# Patient Record
Sex: Male | Born: 1955
Health system: Southern US, Community
[De-identification: ages and names within clinical notes are randomized; demographics above are authoritative.]

## PROBLEM LIST (undated history)

## (undated) DIAGNOSIS — Z87442 Personal history of urinary calculi: Secondary | ICD-10-CM

## (undated) DIAGNOSIS — M2550 Pain in unspecified joint: Secondary | ICD-10-CM

## (undated) DIAGNOSIS — M1990 Unspecified osteoarthritis, unspecified site: Secondary | ICD-10-CM

## (undated) DIAGNOSIS — J189 Pneumonia, unspecified organism: Secondary | ICD-10-CM

## (undated) DIAGNOSIS — I10 Essential (primary) hypertension: Secondary | ICD-10-CM

## (undated) DIAGNOSIS — E669 Obesity, unspecified: Secondary | ICD-10-CM

## (undated) DIAGNOSIS — G4730 Sleep apnea, unspecified: Secondary | ICD-10-CM

## (undated) DIAGNOSIS — G4733 Obstructive sleep apnea (adult) (pediatric): Secondary | ICD-10-CM

## (undated) DIAGNOSIS — I499 Cardiac arrhythmia, unspecified: Secondary | ICD-10-CM

## (undated) DIAGNOSIS — M549 Dorsalgia, unspecified: Secondary | ICD-10-CM

## (undated) DIAGNOSIS — R0602 Shortness of breath: Secondary | ICD-10-CM

## (undated) HISTORY — PX: HIP SURGERY: SHX245

## (undated) HISTORY — DX: Obstructive sleep apnea (adult) (pediatric): G47.33

## (undated) HISTORY — PX: ACHILLES TENDON REPAIR: SUR1153

## (undated) HISTORY — DX: Shortness of breath: R06.02

## (undated) HISTORY — DX: Unspecified osteoarthritis, unspecified site: M19.90

## (undated) HISTORY — PX: KIDNEY STONE SURGERY: SHX686

## (undated) HISTORY — PX: COLONOSCOPY: SHX174

## (undated) HISTORY — PX: SHOULDER SURGERY: SHX246

## (undated) HISTORY — DX: Dorsalgia, unspecified: M54.9

## (undated) HISTORY — DX: Obesity, unspecified: E66.9

## (undated) HISTORY — PX: LITHOTRIPSY: SUR834

## (undated) HISTORY — DX: Pain in unspecified joint: M25.50

---

## 1979-01-01 HISTORY — PX: NASAL SEPTUM SURGERY: SHX37

## 1988-12-31 HISTORY — PX: SHOULDER SURGERY: SHX246

## 1993-05-02 HISTORY — PX: JOINT REPLACEMENT: SHX530

## 1998-02-10 ENCOUNTER — Ambulatory Visit (HOSPITAL_COMMUNITY): Admission: RE | Admit: 1998-02-10 | Discharge: 1998-02-10 | Payer: Self-pay | Admitting: Family Medicine

## 1998-02-10 ENCOUNTER — Encounter: Payer: Self-pay | Admitting: Family Medicine

## 1998-03-10 ENCOUNTER — Emergency Department (HOSPITAL_COMMUNITY): Admission: EM | Admit: 1998-03-10 | Discharge: 1998-03-10 | Payer: Self-pay | Admitting: Emergency Medicine

## 1998-03-20 ENCOUNTER — Encounter: Payer: Self-pay | Admitting: *Deleted

## 1998-03-20 ENCOUNTER — Ambulatory Visit (HOSPITAL_COMMUNITY): Admission: RE | Admit: 1998-03-20 | Discharge: 1998-03-20 | Payer: Self-pay | Admitting: *Deleted

## 1998-05-02 DIAGNOSIS — I499 Cardiac arrhythmia, unspecified: Secondary | ICD-10-CM

## 1998-05-02 HISTORY — DX: Cardiac arrhythmia, unspecified: I49.9

## 1998-06-11 ENCOUNTER — Observation Stay (HOSPITAL_COMMUNITY): Admission: EM | Admit: 1998-06-11 | Discharge: 1998-06-12 | Payer: Self-pay | Admitting: Emergency Medicine

## 1998-06-11 ENCOUNTER — Encounter: Payer: Self-pay | Admitting: Urology

## 1998-06-12 HISTORY — PX: DOPPLER ECHOCARDIOGRAPHY: SHX263

## 1998-06-18 ENCOUNTER — Ambulatory Visit (HOSPITAL_COMMUNITY): Admission: RE | Admit: 1998-06-18 | Discharge: 1998-06-18 | Payer: Self-pay | Admitting: Urology

## 1998-06-18 ENCOUNTER — Encounter: Payer: Self-pay | Admitting: Urology

## 1999-11-18 ENCOUNTER — Ambulatory Visit (HOSPITAL_COMMUNITY): Admission: RE | Admit: 1999-11-18 | Discharge: 1999-11-18 | Payer: Self-pay | Admitting: Urology

## 1999-11-18 ENCOUNTER — Encounter: Payer: Self-pay | Admitting: Urology

## 2001-11-15 ENCOUNTER — Encounter: Payer: Self-pay | Admitting: Urology

## 2001-11-15 ENCOUNTER — Ambulatory Visit (HOSPITAL_BASED_OUTPATIENT_CLINIC_OR_DEPARTMENT_OTHER): Admission: RE | Admit: 2001-11-15 | Discharge: 2001-11-15 | Payer: Self-pay | Admitting: Urology

## 2003-06-23 ENCOUNTER — Inpatient Hospital Stay (HOSPITAL_COMMUNITY): Admission: RE | Admit: 2003-06-23 | Discharge: 2003-06-26 | Payer: Self-pay | Admitting: Orthopedic Surgery

## 2004-08-09 ENCOUNTER — Encounter: Admission: RE | Admit: 2004-08-09 | Discharge: 2004-08-09 | Payer: Self-pay | Admitting: Orthopedic Surgery

## 2004-08-17 ENCOUNTER — Encounter: Admission: RE | Admit: 2004-08-17 | Discharge: 2004-08-17 | Payer: Self-pay | Admitting: Orthopedic Surgery

## 2004-09-22 HISTORY — PX: OTHER SURGICAL HISTORY: SHX169

## 2004-12-03 ENCOUNTER — Encounter: Admission: RE | Admit: 2004-12-03 | Discharge: 2004-12-03 | Payer: Self-pay | Admitting: Orthopedic Surgery

## 2005-03-02 ENCOUNTER — Ambulatory Visit (HOSPITAL_COMMUNITY): Admission: RE | Admit: 2005-03-02 | Discharge: 2005-03-02 | Payer: Self-pay | Admitting: Orthopedic Surgery

## 2007-04-18 ENCOUNTER — Encounter: Admission: RE | Admit: 2007-04-18 | Discharge: 2007-04-18 | Payer: Self-pay | Admitting: Orthopedic Surgery

## 2007-07-19 ENCOUNTER — Ambulatory Visit (HOSPITAL_BASED_OUTPATIENT_CLINIC_OR_DEPARTMENT_OTHER): Admission: RE | Admit: 2007-07-19 | Discharge: 2007-07-19 | Payer: Self-pay | Admitting: Orthopedic Surgery

## 2008-07-17 ENCOUNTER — Ambulatory Visit (HOSPITAL_COMMUNITY): Admission: RE | Admit: 2008-07-17 | Discharge: 2008-07-18 | Payer: Self-pay | Admitting: Orthopedic Surgery

## 2009-12-24 ENCOUNTER — Ambulatory Visit (HOSPITAL_COMMUNITY): Admission: RE | Admit: 2009-12-24 | Discharge: 2009-12-24 | Payer: Self-pay | Admitting: Urology

## 2010-07-15 LAB — SURGICAL PCR SCREEN

## 2010-08-12 LAB — URINALYSIS, ROUTINE W REFLEX MICROSCOPIC
Ketones, ur: NEGATIVE mg/dL
Leukocytes, UA: NEGATIVE
Nitrite: NEGATIVE
Protein, ur: NEGATIVE mg/dL

## 2010-08-12 LAB — PROTIME-INR: INR: 1 (ref 0.00–1.49)

## 2010-08-12 LAB — CBC
HCT: 42.4 % (ref 39.0–52.0)
MCHC: 34.6 g/dL (ref 30.0–36.0)
MCV: 87 fL (ref 78.0–100.0)
Platelets: 238 10*3/uL (ref 150–400)
RDW: 12.9 % (ref 11.5–15.5)

## 2010-08-12 LAB — COMPREHENSIVE METABOLIC PANEL
Albumin: 4 g/dL (ref 3.5–5.2)
BUN: 16 mg/dL (ref 6–23)
Calcium: 10.1 mg/dL (ref 8.4–10.5)
Creatinine, Ser: 1.13 mg/dL (ref 0.4–1.5)
Total Bilirubin: 0.7 mg/dL (ref 0.3–1.2)
Total Protein: 6.6 g/dL (ref 6.0–8.3)

## 2010-08-12 LAB — APTT: aPTT: 33 seconds (ref 24–37)

## 2010-09-14 NOTE — Op Note (Signed)
NAME:  Luis, Mccann                  ACCOUNT NO.:  [id number]   MEDICAL RECORD NO.:  [id number]          PATIENT TYPE:  OIB   LOCATION:  0098                         FACILITY:  [facility]   PHYSICIAN:  Ollen Gross, M.D.    DATE OF BIRTH:  1955-05-23   DATE OF PROCEDURE:  07/17/2008  DATE OF DISCHARGE:                               OPERATIVE REPORT   PREOPERATIVE DIAGNOSIS:  Right hip flexor tendon synovitis/impingement  syndrome.   POSTOPERATIVE DIAGNOSIS:  Right hip flexor tendon synovitis/impingement  syndrome.   PROCEDURE:  Right hip flexor tendon release.   SURGEON:  Homero Fellers Aluisio. MD.   ASSISTANTAvel Peace PA-C.   ANESTHESIA:  General.   ESTIMATED BLOOD LOSS:  Minimal.   DRAIN:  None.   COMPLICATIONS:  None.   CONDITION:  Stable to recovery.   BRIEF CLINICAL NOTE:  Luis Mccann is a 55 year old male status post right  total hip arthroplasty several years ago.  He has had a persistent pain  and popping anteriorly in his hip.  He has had an extensive workup of  this and there has been no definitive cause.  He has seen multiple  orthopedic surgeons in second opinion, all joint replacement  specialists, who have felt that at most it was a flexor tendon impinging  on a joint.  We have had multiple long discussions over the ensuing  years over what to do with this.  He has come into a stage where he  finally wants to have this addressed surgically.  We discussed the pros  and cons of flexor tendon release including the possibility that it  might not relieve his problem.  He has is at a stage now where he wants  to go ahead and try this to see if it could eliminate the pain and  popping.   PROCEDURE IN DETAIL:  After the successful administration of general  anesthetic, the patient is placed in the left lateral decubitus position  with the right side up and held with the hip positioner.  The right  lower extremity is isolated from his perineum with plastic drapes and  prepped  and draped in the usual sterile fashion.  I used his previous  posterolateral incision and cut the skin with a 10-blade through the  subcutaneous tissue to the fascia lata which was incised in line with  the skin incision.  The sciatic nerve was palpated and protected.  I  excised the pseudocapsule off the posterior femur.  We entered the  joint.  We did not encounter any significant joint fluid.  There was no  evidence of any osteolytic debris in the joint.  I thoroughly irrigated  the joint.  He did have what appeared to be hypertrophic capsule  anteriorly.  I excised all of that to fully clear out the anterior  capsule from the joint.  We then internally rotated the hip and I  palpated the iliopsoas tendon as it inserted onto the lesser trochanter.  I recessed the tendon off of the lesser trochanter with electrocautery.  I then thoroughly inspected the joint again,  there were no other areas  which potentially would have been impinging as I placed them through a  range of motion.  We thoroughly irrigated the joint once again and then  closed the fascia lata with interrupted #1 Vicryl, subcu interrupted #1-  0 and #2-0 Vicryl, and subcuticular running 4-0 Monocryl.  The incision  was cleaned and dried and Steri-Strips and a bulky sterile dressing are  applied.  He is then awakened and transported to recovery in stable  condition.      Ollen Gross, M.D.  Electronically Signed     FA/MEDQ  D:  07/17/2008  T:  07/17/2008  Job:  [id number]

## 2010-09-14 NOTE — Op Note (Signed)
NAME:  Luis Mccann, Luis Mccann                  ACCOUNT NO.:  [id number]   MEDICAL RECORD NO.:  [id number]          PATIENT TYPE:  AMB   LOCATION:  NESC                         FACILITY:  [facility]   PHYSICIAN:  Deidre Ala, M.D.    DATE OF BIRTH:  21-Feb-1956   DATE OF PROCEDURE:  07/19/2007  DATE OF DISCHARGE:                               OPERATIVE REPORT   PREOPERATIVE DIAGNOSIS:  1. Impingement right shoulder with type 3 acromion.  2. Partial-thickness rotator cuff tear.  3. Osteoarthritis acromioclavicular joint.   POSTOPERATIVE DIAGNOSIS:  1. Impingement right shoulder with type 3 acromion.  2. Partial-thickness rotator cuff tear.  3. Osteoarthritis acromioclavicular joint.   PROCEDURE:  1. Right shoulder operative arthroscopy with subacromial arch      decompression and acromioplasty.  2. Arthroscopic distal clavicle resection.  3. Debride partial-thickness rotator cuff tear and subdeltoid bursa.   SURGEON:  Dr. Doristine Section.   ASSISTANT:  Phineas Semen, PA-C.   ANESTHESIA:  General endotracheal.   CULTURES:  None.   DRAINS:  None.   ESTIMATED BLOOD LOSS:  Minimal.   PATHOLOGIC FINDINGS AND HISTORY:  Manasseh is a 55 year old male who  presented with persistent shoulder pain.  He had signs of impingement.  He had a type 4 acromion on 35 degree caudad acromial view, AC joint  squaring changes. The shoulder was at one continuing to have discomfort  and we got an MRI scan which showed rotator cuff disease with partial  articular surface tear of the distal supraspinatus tendon, no full-  thickness tear. He had partially extravasated fluid from the  subscapularis recess into the substance of the subscapularis muscle  small superior labral tear.  He had had a shoulder arthroscopy on the  opposite side in the past and was doing well with that.  Ultimately he  was not making progress with his shoulder, so I elected to proceed with  surgical intervention.  At surgery, he had an intact  biceps anchor. The  biceps tendon had no evidence of significant tendonitis although it was  a little frayed proximally that we debrided and smoothed with the  ablator.  There was no slap pull off.  The glenohumeral joint looked  good.  There was some undersurface irregularity of the rotator cuff at  the tuberosity that we debrided and ablated, a mild anterior shoulder  triangle synovitis.  In the subacromial space, he had a very sharp  anterior acromion with a hook type 4 and a very arthritic distal  clavicle with marked degeneration of the [facility] meniscus that looked like it  had been very much traumatized.  We resected the anterior acromion and  distal clavicle to Caspari margins.  The rotator cuff had a central worn  area that was about a third to 50% through delaminated but was deeper  thereof and intact to the tuberosity otherwise, so this was shaved and  ablated.  There was no through-and-through tear.  Good decompression was  obtained.   PROCEDURE:  With adequate anesthesia obtained using endotracheal  technique with a failure of the attempt to  place a block, the patient  was placed in the supine beach chair position, the right shoulder was  then prepped and draped in the standard fashion.  After standard  prepping and draping, skin markings were made for anatomic positioning.  I then injected 20 mL 0.5% Marcaine with epinephrine into the  subacromial space to open it up.  I then entered the shoulder through a  posterior portal, anterior portal was established just lateral to the  coracoid.  I then probed and lightly debrided the superior labrum,  anterior triangle synovitis and under the rotator cuff. I  also pull the  biceps into the shoulder to check its continuity and its  health.  I  then lightly debrided out some proximal tearing of the biceps and  smoothed with the ablator on one.  I then smoothed all these areas with  the ablator on one. The portals were reversed and similar  shavings  carried out.  I then entered the subacromial space through the posterior  portal, anterolateral portal was established.  I then shaved the  anterior undersurface of the acromion and smoothed with the ablator on  one.  I then brought in a 6.0 bur and completed acromioplasty of the  roof of the subacromial space in the manner of Caspari.  I then turned  the scope medially sideways and through the anterior portal did an  extensive debridement of the AC meniscus, brought in a shaver and then  brought in a bur and completed distal clavicle resection two shaver  breadths in.  I then looked at the shoulder from the lateral portal and  looked down upon the rotator cuff with internal-external rotation,  neutral and abduction.  The bursa was excised completely.  I used the  ablator on one to smooth the areas where we had debrided on the central  rotator cuff partial thickness tear and also brought in the bur to  complete acromioplasty back to the bicortical bone in the manner of  Caspari.  When I was satisfied with the resection, the shoulder was  irrigated through the scope, 0.5% Marcaine injected in about the  portals.  The portals were closed with 4-0 nylon.  A bulky sterile  compressive dressing was applied with sling and the patient having  tolerated the procedure well was awakened and taken to the recovery room  in satisfactory condition to be given Percocet for pain and told to call  the office for recheck tomorrow.           ______________________________  V. Charlesetta Shanks, M.D.     VEP/MEDQ  D:  07/19/2007  T:  07/19/2007  Job:  [id number]

## 2010-09-17 NOTE — Discharge Summary (Signed)
NAME:  Luis Mccann, Luis Mccann NO.:  [id number]   MEDICAL RECORD NO.:  [id number]                   PATIENT TYPE:  INP   LOCATION:  0482                                 FACILITY:  [facility]   PHYSICIAN:  Ollen Gross, M.D.                 DATE OF BIRTH:  August 02, 1955   DATE OF ADMISSION:  06/23/2003  DATE OF DISCHARGE:  06/26/2003                                 DISCHARGE SUMMARY   ADMISSION DIAGNOSES:  1. Osteoarthritis, right hip.  2. Hypertension.  3. History of renal calculi.   DISCHARGE DIAGNOSES:  1. Osteoarthritis, right hip, status post right total hip arthroplasty.  2. Mild postoperative blood loss anemia.  Did not require transfusion.  3. Mild postoperative hyponatremia, improving.   PROCEDURE:  On June 23, 2003, right total hip arthroplasty.   SURGEON:  Ollen Gross, M.D.   ASSISTANT:  Alexzandrew L. Julien Girt, P.A.   ANESTHESIA:  General.   BLOOD LOSS:  300 cc.   DRAINS:  Hemovac x1.   CONSULTS:  None.   BRIEF HISTORY:  Kenai is a 54 year old male with a longstanding history of  progressive right hip pain, end-stage arthritis, who failed nonoperative  management, who presents now for a total hip.   LABORATORY DATA:  CBC preop reveals a hemoglobin of 14.3, hematocrit 41.2,  white cell count 4.9, red cell count 4.77, differential within normal  limits.  Postop H&H 11.5 and 32.8.  Last H&H 10.9 and 30.6.  PT/PTT preop  12.8 and 31 respectively with an INR of 0.9 and ___________.  Last PT/INR  9.27 and 2.1.  Chem panel on admission all within normal limits.  Minimally  elevated calcium of 10.8.  Low ALP of 35.  Serial BMETs were followed.  Sodium did drop from 139 down to 132, back up to 134.  Preop UA negative.  Blood type O+.   Right hip films preop on June 18, 2003:  Severe degenerative changes,  right hip.  Chest x-ray preop:  No active disease.  Right hip films postop  on June 23, 2003:  Right hip arthroplasty in expected  position.  Followup films on June 26, 2003 showed anatomic alignment, status post  right hip arthroplasty.  No complicating features.   HOSPITAL COURSE:  The patient was admitted to [facility], taken to  the OR.  Underwent the above-stated procedure without complications.  Patient tolerated the procedure well.  Was later taken to the recovery room  and then to the orthopedic floor to continue postop care.  Vital signs were  followed.  Patient is given 24 hours of postop IV antibiotics in the form of  Ancef.  Placed on Coumadin.  Given Percocet and PCA morphine for pain  control.  Placed on touchdown weightbearing.  PT/OT were consulted postop.  Started back on his home meds.  The Hemovac drain placed at the time of  surgery was pulled on postop day #1 without difficulty.  The patient had  hardly any pain at rest, only some discomfort with movement.  He was doing  quite well by day #1 and actually progressed very well with physical  therapy, up ambulating approximately 60 feet by day #2.  Dressing changes  were initiated on day #2.  The incision was healing well by day #3.  The  patient had felt a pop in his hip on the evening and morning thereof on  June 26, 2003.  He was concerned about the hip.  He was not having any  pain at rest, but due to the pop, x-rays were ordered.  Follow-up x-rays  were shown to be negative.  He actually only had some minimal soreness,  which was improving with medications.  He did very well with physical  therapy that day, up ambulating approximately 120 feet the evening before  and 60 feet that day.  It was felt as long as he was not having any  problems, he could be discharged home.  Patient was in agreement and was  discharged home later that day.   DISCHARGE PLAN:  1. Patient was discharged home on June 26, 2003.  2. For discharge diagnoses, please see above.  3. Discharge meds:  Coumadin, Percocet, Robaxin.  4. Diet:  Low  sodium.  5. Activity:  Touchdown weightbearing.  Hip precautions.  Home health PT.     Home health nursing.  Total hip protocol.  6. Followup:  Two weeks from surgery.  Call the office for an appointment at     [phone].  May start showering.   DISPOSITION:  Home.   CONDITION ON DISCHARGE:  Improved.     Alexzandrew L. Julien Girt, P.A.              Ollen Gross, M.D.    ALP/MEDQ  D:  08/04/2003  T:  08/05/2003  Job:  [id number]

## 2010-09-17 NOTE — H&P (Signed)
NAME:  Luis Mccann, Luis Mccann NO.:  [id number]   MEDICAL RECORD NO.:  [id number]                   PATIENT TYPE:  INP   LOCATION:  NA                                   FACILITY:  [facility]   PHYSICIAN:  Ollen Gross, M.D.                 DATE OF BIRTH:  1955/09/01   DATE OF ADMISSION:  06/23/2003  DATE OF DISCHARGE:                                HISTORY & PHYSICAL   CHIEF COMPLAINT:  Right hip pain.   HISTORY OF PRESENT ILLNESS:  The patient is a 55 year old male who has been  seen by Dr. Lequita Halt for ongoing hip pain.  He has known right hip arthritis.  His hip pain has been progressive, especially over the past several months.  He has previously been seen by Dr. Milly Jakob and Dr. Darrelyn Hillock in the past.  He has been seen and followed lately by Dr. Lequita Halt.  He was seen in the  office where x-rays show severe end-stage arthritis of the right hip.  His  pain has been progressive, and it has reached the point where he would like  to have something done about it.  It is felt he would benefit from  undergoing total hip.  Risks and benefits discussed, and the patient  subsequently admitted to the hospital.   ALLERGIES:  No known drug allergies.   CURRENT MEDICATIONS:  1. Nadolol 20 mg daily.  2. Norvasc 10 mg daily.  3. Benazepril 20 mg daily.  4. Hydrochlorothiazide 25 mg daily.  5. Tylenol.   PAST MEDICAL HISTORY:  1. Hypertension.  2. History of renal calculi.  3. Osteoarthritis.   PAST SURGICAL HISTORY:  1. A tracheostomy in 1957.  2. A deviated septum surgery in 1990.  3. Left Achilles tendon repair in 1985, and also in 1999.  4. Left shoulder arthroscopy in 1996.  5. Renal calculi lithotripsy in 2000, 2001, and 2003.   SOCIAL HISTORY:  Married.  Accounting controller.  Nonsmoker.  Occasional  beer.  One child.  A 2 story home, 2 floors with 13 steps.   FAMILY HISTORY:  He has 3 uncles, all with a history of stroke.  A sister  with a history of  multiple myeloma.   REVIEW OF SYSTEMS:  GENERAL:  No fevers, chills, or night sweats.  NEUROLOGIC:  No seizures, syncope, or paralysis.  RESPIRATORY:  He has had a  mild productive cough, for which he is taking Tylenol Flu.  A mild  productive cough.  No shortness of breath, no hemoptysis.  CARDIOVASCULAR:  No chest pain, injury, or orthopnea.  GI:  No nausea, vomiting, diarrhea, or  constipation.  GU:  No dysuria or hematuria or discharge.  MUSCULOSKELETAL:  Pertinent for the hip found in the history of present illness.   PHYSICAL EXAMINATION:  VITAL SIGNS:  Pulse 56, respirations 12, blood  pressure 104/74.  GENERAL:  A 55 year old white male, well-nourished, well-developed, in no  acute distress.  Alert, oriented, and cooperative.  HEENT:  Normocephalic and atraumatic.  Pupils equal, round and reactive to  light.  Extraocular movements are intact.  Oropharynx clear.  NECK:  Supple.  CHEST:  Clear, anterior and posterior chest walls.  HEART:  Regular rate and rhythm.  No murmurs.  ABDOMEN:  Soft, nontender.  Bowel sounds are present.  RECTAL/BREAST/GENITALIA:  Not done.  Not pertinent to present illness.  EXTREMITIES:  Significant to the right hip.  Hip flexion of only 70 degrees.  No internal rotation.  There was 20 degrees of external rotation, and 20  degrees of abduction.  Motor intact.  Antalgic gait.   IMPRESSION:  1. Osteoarthritis, right hip.  2. Hypertension.  3. History of renal calculi.   PLAN:  Right total hip arthroplasty.  Surgery will be performed by Dr. Ollen Gross.  The patient's primary care physicians are the [facility] physicians out  at [facility].  They will be notified and consulted, and we will  consult the [facility] hospitalist if needed for any medical assistance  throughout the hospital course.     Alexzandrew L. Julien Girt, P.A.              Ollen Gross, M.D.    ALP/MEDQ  D:  06/22/2003  T:  06/22/2003  Job:  [id number]

## 2010-09-17 NOTE — Op Note (Signed)
NAME:  Luis Mccann, Luis Mccann                  ACCOUNT NO.:  [id number]   MEDICAL RECORD NO.:  [id number]          PATIENT TYPE:  AMB   LOCATION:  DAY                          FACILITY:  [facility]   PHYSICIAN:  Ollen Gross, M.D.    DATE OF BIRTH:  1955-10-15   DATE OF PROCEDURE:  03/02/2005  DATE OF DISCHARGE:                                 OPERATIVE REPORT   PREOPERATIVE DIAGNOSES:  Right hip pain and impingement.   POSTOPERATIVE DIAGNOSES:  Right hip pain and impingement.   PROCEDURE:  Right hip arthroscopy with synovial debridement.   SURGEON:  Ollen Gross, M.D.   ASSISTANT:  None.   ANESTHESIA:  General.   ESTIMATED BLOOD LOSS:  Minimal.   DRAINS:  None.   COMPLICATIONS:  None.   CONDITION:  Stable to recovery.   BRIEF CLINICAL NOTE:  [name] is a 55 year old male who had a right total hip  arthroplasty done approximately 2 years ago. He was extremely well initially  and then about 6 months postoperatively developed a catching tight pain in  the hip. He has had multiple studies performed none of which has elucidated  the cause of the pain. Given his persistent discomfort with activity, it is  decided that we are going to attempt an arthroscopy to see if we find any  impinging tissue and see if this will help alleviate the discomfort. We have  gone over multiple different types of procedures in detail and he elected to  proceed with arthroscopy. He presents now for that procedure.   PROCEDURE IN DETAIL:  After successful administration of general anesthetic,  the patient's placed in left lateral decubitus position with the right side  up and his right thigh is placed over the perineal post which is well-padded  and right foot placed into the traction boot which is well padded. I placed  gentle traction across the hip so as just to slightly separate but not  dislocate the components. He has a total hip arthroplasty in place. Under  fluoroscopic guidance, we applied the traction to  allow for slight  separation between the femoral head and acetabular component. At this point,  the thigh is prepped and draped in the usual sterile fashion. The standard  anterior and posterior peritrochanteric portal sites are marked and the  spinal needles are passed to enter the joint. We confirmed that both were  intra-articular by injecting saline through the posterior needle and it was  exiting through the anterior needle. The nitinol wires were then passed and  a small incision made around the posterior needle. The dilators placed in  the 5 mm cannula. We then passed the camera into the joint to confirm that  it was intra-articular. This no evidence of any metalosis or damage to the  surface of the femoral head. I thoroughly inspected around there. There was  no evidence of any significantly inflamed synovium. There is some  hypertrophic synovium anteriorly. We then identified the anterior needle and  created an anterior portal. Using a combination of a 4.2 mm shaver and the  ArthroCare device, we  cleared out enough of the anterior tissue so as to see  the entire anterior rim of the cup. There is no evidence of any metallic  debris or third body type mechanism that could have been impinging. I feel  it was the tissue that was probably causing this and we did debride enough  where we could see the entire anterior edge of the acetabular component. No  evidence of any abnormal wear was noted. This will be cleared superior and  posterior also. All of the hypertrophic tissue was debrided. I then released  the traction to show that the hip had reduce concentrically. We then removed  the arthroscopic equipment and closed the portals with interrupted 4-0  nylon. The bulky sterile dressing was then applied. He was awakened and  transported to recovery in stable condition.      Ollen Gross, M.D.  Electronically Signed     FA/MEDQ  D:  03/02/2005  T:  03/02/2005  Job:  [id number]

## 2010-09-17 NOTE — Op Note (Signed)
NAME:  Luis Mccann, Luis Mccann NO.:  [id number]   MEDICAL RECORD NO.:  [id number]                   PATIENT TYPE:  INP   LOCATION:  Z610                                 FACILITY:  [facility]   PHYSICIAN:  Ollen Gross, M.D.                 DATE OF BIRTH:  03-16-1956   DATE OF PROCEDURE:  06/23/2003  DATE OF DISCHARGE:                                 OPERATIVE REPORT   PREOPERATIVE DIAGNOSIS:  Osteoarthritis, right hip.   POSTOPERATIVE DIAGNOSIS:  Osteoarthritis, right hip.   OPERATION/PROCEDURE:  Right total hip arthroplasty.   SURGEON:  Ollen Gross, M.D.   ASSISTANT:  Luis Mccann, P.A.   ANESTHESIA:  General.   ESTIMATED BLOOD LOSS:  300 mL.   DRAINS:  Hemovac x1.   COMPLICATIONS:  None.   CONDITION:  Stable to recovery room.   BRIEF CLINICAL NOTE:  Luis Mccann is a 55 year old male who has a long history of  progressive worsening right hip pain with end-stage arthritis of the right  hip.  He has failed nonoperative management and presents now for total hip  arthroplasty.   DESCRIPTION OF PROCEDURE:  After the successful administration of general  anesthetic, the patient was placed in the left lateral decubitus position  with the right side up and held with the hip positioner.  Right lower  extremity was isolated from his perineum with plastic drapes and prepped and  draped in the usual sterile fashion.  A mini posterolateral incision was  made with a 10-blade through the subcutaneous tissue to the level of the  fascia lata which was incised in line with the skin incision.  Sciatic nerve  was palpated and protected and short rotators isolated off the femur.  The  capsulectomy was then performed and the  hip dislocated.  The center of the  femoral head marked, and trial prosthesis is placed such that the center of  the trial head corresponds to the center of his native femoral head.  Osteotomy line is marked on the femoral neck and osteotomy  is made with an  oscillating saw.  The femur was then retracted anteriorly to gain acetabular  exposure.   Reaming starts with a 47 coursing in increments of two to a 53, then a 54 mm  Pinnacle acetabular shell was placed in the anatomic position, transfixed  with two dome screws.  Rim osteophytes were then removed.  Trial 36 mm  neutral liner was placed.   The femoral preparation was initiated with the canal finder and then  irrigation.  Axial reaming was performed with a 13.5 mm, proximal reaming to  an 18-F and the sleeve machine to a large.  The 18-F large sleeve is placed  with an 18 x 13 stem and a 36+8 neck.  Native version was about 5-10 degrees  so I added 10 degrees of anteversion.  I reduced the hip  with a 36+0 head  and he had great stability, full extension and full external rotation, 70  degrees flexion, 40 degrees adduction, 90 degrees internal rotation, and 90  degrees flexion, 70 degrees internal rotation.  The trials were all removed  and then a permanent apex whole eliminator was placed in the acetabular  shell.  The permanent 36 mm neutral Ultramet metal liner was placed into the  acetabular shell.  This is a metal-on-metal hip replacement.  Permanent 18-F  large sleeve was placed in the proximal femur and an 18 x 13 stem with 36+8  neck.  We again placed it 10 degrees beyond his native anteversion.  The  permanent 36+0 head is placed and reduced to the same stability parameters.  The wound was copiously irrigated with antibiotic solution and short  rotators reattached to the femur through drill holes.  Fascia lata was  closed over a Hemovac drain with interrupted #1 Vicryl, subcu closed with #1  and 2-0 Vicryl, subcuticular running 4-0 Monocryl.  I took 20 mL of 0.25%  Marcaine with epinephrine and injected into the subcutaneous tissues.  The  Steri-Strips and a bulky sterile dressing applied.  Drain hooked to suction.  He was placed in a knee immobilizer, then  awakened and transported to the  recovery in stable condition.                                               Ollen Gross, M.D.    FA/MEDQ  D:  06/23/2003  T:  06/23/2003  Job:  [id number]

## 2011-01-24 LAB — POCT I-STAT 4, (NA,K, GLUC, HGB,HCT): Potassium: 4.1

## 2011-10-25 HISTORY — PX: OTHER SURGICAL HISTORY: SHX169

## 2012-08-15 ENCOUNTER — Ambulatory Visit: Admit: 2012-08-15 | Payer: Self-pay | Admitting: Otolaryngology

## 2012-08-15 SURGERY — SEPTOPLASTY, NOSE, WITH NASAL TURBINATE REDUCTION
Anesthesia: General

## 2012-09-14 ENCOUNTER — Telehealth: Payer: Self-pay | Admitting: *Deleted

## 2012-09-14 NOTE — Telephone Encounter (Signed)
09/14/2012 Called and discussed Dr. Renaye Rakers answer to question below.  Pt. Doesn't totally agree wants to take for 1 month.  Told him to call Dr. Hyacinth Meeker immediately if he has any side effects.  Voiced understanding.   09/10/12 Call from pt:  Dr. Sigmund Hazel has prescribed for him for Phentermine for a month. What is Dr. Renaye Rakers opinion on this med? Return call from [place] will review with Dr. Salena Saner on 09/13/2012. Pt verbalized understanding.  Per Dr. Salena Saner - I don't think it is a good idea, although not absolutely contraindicated.  Risk of Pulmonary Hypertension.

## 2012-11-08 ENCOUNTER — Other Ambulatory Visit: Payer: Self-pay | Admitting: Cardiovascular Disease

## 2012-11-08 NOTE — Telephone Encounter (Signed)
Rx was sent to pharmacy electronically. 

## 2012-12-20 ENCOUNTER — Encounter (HOSPITAL_COMMUNITY): Payer: Self-pay | Admitting: Pharmacy Technician

## 2012-12-21 NOTE — Pre-Procedure Instructions (Signed)
ADYNN CASERES  12/21/2012   Your procedure is scheduled on:  Wednesday, September 3rd  Report to [facility] at 0630 AM.  Call this number if you have problems the morning of surgery: [phone]   Remember:   Do not eat food or drink liquids after midnight.   Take these medicines the morning of surgery with A SIP OF WATER: Norvasc   Do not wear jewelry.  Do not wear lotions, powders, or perfumes. You may wear deodorant.  Do not shave 48 hours prior to surgery. Men may shave face and neck.  Do not bring valuables to the hospital.  [facility] is not responsible  for any belongings or valuables.  Contacts, dentures or bridgework may not be worn into surgery.  Leave suitcase in the car. After surgery it may be brought to your room.  For patients admitted to the hospital, checkout time is 11:00 AM the day of  discharge.   Patients discharged the day of surgery will not be allowed to drive home.    Special Instructions: Shower using CHG 2 nights before surgery and the night before surgery.  If you shower the day of surgery use CHG.  Use special wash - you have one bottle of CHG for all showers.  You should use approximately 1/3 of the bottle for each shower.   Please read over the following fact sheets that you were given: Pain Booklet, Coughing and Deep Breathing and Surgical Site Infection Prevention

## 2012-12-24 ENCOUNTER — Encounter (HOSPITAL_COMMUNITY)
Admission: RE | Admit: 2012-12-24 | Discharge: 2012-12-24 | Disposition: A | Discharge status: Home / Self Care | Payer: BC Managed Care – PPO | Source: Ambulatory Visit | Attending: Otolaryngology | Admitting: Otolaryngology

## 2012-12-24 ENCOUNTER — Ambulatory Visit (HOSPITAL_COMMUNITY)
Admission: RE | Admit: 2012-12-24 | Discharge: 2012-12-24 | Disposition: A | Discharge status: Home / Self Care | Payer: BC Managed Care – PPO | Source: Ambulatory Visit | Attending: Otolaryngology | Admitting: Otolaryngology

## 2012-12-24 ENCOUNTER — Encounter (HOSPITAL_COMMUNITY): Payer: Self-pay

## 2012-12-24 DIAGNOSIS — Z01818 Encounter for other preprocedural examination: Secondary | ICD-10-CM | POA: Insufficient documentation

## 2012-12-24 DIAGNOSIS — Z01810 Encounter for preprocedural cardiovascular examination: Secondary | ICD-10-CM | POA: Insufficient documentation

## 2012-12-24 DIAGNOSIS — Z01812 Encounter for preprocedural laboratory examination: Secondary | ICD-10-CM | POA: Insufficient documentation

## 2012-12-24 HISTORY — DX: Cardiac arrhythmia, unspecified: I49.9

## 2012-12-24 HISTORY — DX: Essential (primary) hypertension: I10

## 2012-12-24 HISTORY — DX: Sleep apnea, unspecified: G47.30

## 2012-12-24 HISTORY — DX: Unspecified osteoarthritis, unspecified site: M19.90

## 2012-12-24 LAB — CBC
Hemoglobin: 15.1 g/dL (ref 13.0–17.0)
MCH: 31.1 pg (ref 26.0–34.0)
MCHC: 36.9 g/dL — ABNORMAL HIGH (ref 30.0–36.0)
Platelets: 228 10*3/uL (ref 150–400)

## 2012-12-24 LAB — BASIC METABOLIC PANEL
Calcium: 10.6 mg/dL — ABNORMAL HIGH (ref 8.4–10.5)
GFR calc Af Amer: 82 mL/min — ABNORMAL LOW (ref >90)
GFR calc non Af Amer: 71 mL/min — ABNORMAL LOW (ref >90)
Glucose, Bld: 109 mg/dL — ABNORMAL HIGH (ref 70–99)
Potassium: 3.5 mEq/L (ref 3.5–5.1)
Sodium: 139 mEq/L (ref 135–145)

## 2012-12-24 NOTE — Progress Notes (Signed)
Anesthesia Chart Review:  Patient is a 57 year old male scheduled for nasal septoplasty with turbinate reduction on 01/02/13 by Dr. Lazarus Salines.  History includes former smoker, nephrolithiasis, HTN, OSA, single episode of afib with RVR noted on EKG in [facility] from 06/11/98, right THA '05.  A prior H&P from 2005 mentions history of a tracheostomy '57.  PCP is listed as Dr. Sigmund Hazel. Cardiologist is Dr. Royann Shivers at [facility], last visit 01/13/12--whom he is primarily seeing for OSA.   PAT RN already instructed patient to stop phentermine today.  EKG on 12/24/12 showed NSR, possible LAE, incomplete left BBB. Borderline LAD. He has had numerous EKGs in [facility] dating back to 1999.  I think that overall his EKG is stable since at least 11/13/01.  Following evaluation of rapid afib in 06/1998, he had an normal echocardiogram on 06/12/98.  Holter monitor in 06/2000 showed SB with very rare atrial and/or ventricular ectopy.  There is no records of stress test in Epic or at [facility].  CXR on 12/24/12 showed no active cardiopulmonary disease.  Preoperative labs noted.  Overall, I think his EKGs are stable.  He has had no known recurrent afib in nearly 15 years.  No CV symptoms documented at his PAT visit.  If no acute changes then I would anticipate that he could proceed as planned.  History and EKGs reviewed with anesthesiologist Dr. Jacklynn Bue who agrees with plan.    Velna Ochs [facility]/Anesthesiology Phone [phone] 12/24/2012 4:51 PM

## 2013-01-01 ENCOUNTER — Other Ambulatory Visit: Payer: Self-pay | Admitting: Otolaryngology

## 2013-01-01 NOTE — H&P (Signed)
Luis Mccann, Luis Mccann 57 y.o., male [id number]     Chief Complaint: Nasal obstruction, sleep apnea  HPI: 6 months recheck. Preoperative visit. We are planning septoplasty and reduction of turbinates. As per previous discussions, we are going to try to avoid any sort of palatoplasty. The previous sleep study showed apnea hypopnea index almost 90.  He thinks this was mostly an artifact of the test conditions, and that his sleep apnea is typically not as bad.  He is anxious to see if we may be able to cure him by improving his nasal airway. I explained that for severe sleep apnea, surgery alone, especially nasal surgery, is really sufficient. He does think that his snoring is greatly improved when he is able to close his mouth and breath through his nose.  He uses Afrin most days twice to help in breathing the daytime, and to help him sleep at night. He did have a prior septoplasty with Dr. Lyman Bishop 30 years ago.   I discussed the surgery, namely septoplasty and reduction of turbinates in detail including risks and complications. Questions were answered and informed consent was obtained. I may attempt to generate a posterior septal perforation to prevent motion of the membranous septum.  PMH: Past Medical History  Diagnosis Date  . Kidney stones   . Hypertension   . Sleep apnea   . Arthritis     osteoarthritis  . Dysrhythmia 2000    single episode of afib with RVR with normal echo and rare atrial/ventricular ectopy on f/u [facility])    Surg Hx: Past Surgical History  Procedure Laterality Date  . Joint replacement  1995    right hip  . Colonoscopy    . Lithotripsy      x4  . Kidney stone surgery    . Nasal septum surgery    . Achilles tendon repair  1987 and 1990  . Shoulder surgery Left     for burr  . Shoulder surgery Right     abuttment and rotator cuff repair    FHx:  No family history on file. SocHx:  reports that he has quit smoking. He has never used smokeless tobacco. He  reports that  drinks alcohol. He reports that he does not use illicit drugs.  ALLERGIES: No Known Allergies    There were no vitals taken for this visit.  PHYSICAL EXAM: He is stocky and muscular and not particularly overweight. Mental status is appropriate. He hears well and conversational speech. Voice is clear and respirations unlabored to nose and mouth. The head is atraumatic and neck supple. Cranial nerves intact. Ear canals are clear with normal drums. The internal nose shows a leftward septal deviation and bulky turbinates which are reduced following Afrin decongestion. There is some motion of the posterior membranous septum consistent with loss of structural support. No polyps or active drainage. Oral cavity reveals a bulky tongue and mandible and maxilla of normal configuration. He has a very long soft palate and uvula. Neck unremarkable.   Lungs: Clear to auscultation  Heart: Regular rate and rhythm with no murmurs Abdomen: Soft, active Extremities: Normal configuration Neurologic: Symmetric, grossly intact.    Assessment/Plan Deviated nasal septum (470) (J34.2). Hypertrophy of nasal turbinates (478.0) (J34.3). Obstructive sleep apnea, adult (327.23) (G47.33).  You are all set for your surgery.  No aspirin containing compounds for 10 days before surgery. you  will spend overnight with [place] in the hospital after your surgery one night because you have sleep apnea.  I  will remove the nasal packing the following morning before you go home. See the nasal hygiene instructions regarding keeping the nose moist. I am giving you  prescriptions today for Keflex antibiotic pills, oxycodone strong pain medication, and hydrocodone moderate pain medication.  After you go home, I will see you back here in 10 days.  You can try to go back to work between 7 and 10 days. No strenuous activities for 2 weeks.  Cephalexin 500 MG Oral Capsule;TAKE 1 CAPSULE 4 TIMES DAILY; Qty40; R0;  Rx. Hydrocodone-Acetaminophen 5-325 MG Oral Tablet;TAKE 1 TO 2 TABLETS EVERY 4 TO 6 HOURS AS NEEDED FOR PAIN; Qty30; R2; Rx. Oxycodone-Acetaminophen 5-325 MG Oral Tablet;TAKE 1 TO 2 TABLETS EVERY 4 TO 6 HOURS AS NEEDED FOR PAIN; Qty30; R0; Rx.  Flo Shanks 01/01/2013, 5:37 PM

## 2013-01-02 ENCOUNTER — Encounter (HOSPITAL_COMMUNITY): Payer: Self-pay | Admitting: Surgery

## 2013-01-02 ENCOUNTER — Ambulatory Visit (HOSPITAL_COMMUNITY)
Admission: RE | Admit: 2013-01-02 | Discharge: 2013-01-03 | Disposition: A | Discharge status: Home / Self Care | Payer: BC Managed Care – PPO | Source: Ambulatory Visit | Attending: Otolaryngology | Admitting: Otolaryngology

## 2013-01-02 ENCOUNTER — Encounter (HOSPITAL_COMMUNITY)
Admission: RE | Disposition: A | Discharge status: Home / Self Care | Payer: Self-pay | Source: Ambulatory Visit | Attending: Otolaryngology

## 2013-01-02 ENCOUNTER — Encounter (HOSPITAL_COMMUNITY): Payer: Self-pay | Admitting: Vascular Surgery

## 2013-01-02 ENCOUNTER — Ambulatory Visit (HOSPITAL_COMMUNITY): Payer: BC Managed Care – PPO | Admitting: Anesthesiology

## 2013-01-02 DIAGNOSIS — J342 Deviated nasal septum: Secondary | ICD-10-CM | POA: Insufficient documentation

## 2013-01-02 DIAGNOSIS — G4733 Obstructive sleep apnea (adult) (pediatric): Secondary | ICD-10-CM | POA: Insufficient documentation

## 2013-01-02 DIAGNOSIS — Z87891 Personal history of nicotine dependence: Secondary | ICD-10-CM | POA: Insufficient documentation

## 2013-01-02 DIAGNOSIS — J343 Hypertrophy of nasal turbinates: Secondary | ICD-10-CM | POA: Insufficient documentation

## 2013-01-02 DIAGNOSIS — I10 Essential (primary) hypertension: Secondary | ICD-10-CM | POA: Insufficient documentation

## 2013-01-02 DIAGNOSIS — I4891 Unspecified atrial fibrillation: Secondary | ICD-10-CM | POA: Insufficient documentation

## 2013-01-02 HISTORY — PX: NASAL SEPTOPLASTY W/ TURBINOPLASTY: SHX2070

## 2013-01-02 LAB — CBC
HCT: 39.8 % (ref 39.0–52.0)
MCH: 31 pg (ref 26.0–34.0)
MCV: 85 fL (ref 78.0–100.0)
Platelets: 197 10*3/uL (ref 150–400)
RDW: 12.8 % (ref 11.5–15.5)
WBC: 8.3 10*3/uL (ref 4.0–10.5)

## 2013-01-02 LAB — CREATININE, SERUM: GFR calc Af Amer: 87 mL/min — ABNORMAL LOW (ref >90)

## 2013-01-02 SURGERY — SEPTOPLASTY, NOSE, WITH NASAL TURBINATE REDUCTION
Anesthesia: General | Site: Nose | Laterality: Bilateral | Wound class: Clean Contaminated

## 2013-01-02 MED ORDER — OXYMETAZOLINE HCL 0.05 % NA SOLN
2.0000 | NASAL | Status: AC
  Administered 2013-01-02 (×2): 2 via NASAL
  Filled 2013-01-02: qty 15

## 2013-01-02 MED ORDER — OXYCODONE HCL 5 MG/5ML PO SOLN: 5.0000 mg | Freq: Once | ORAL | Status: DC | PRN

## 2013-01-02 MED ORDER — COCAINE HCL 4 % EX SOLN
CUTANEOUS | Status: AC
  Filled 2013-01-02: qty 4

## 2013-01-02 MED ORDER — BACITRACIN ZINC 500 UNIT/GM EX OINT
TOPICAL_OINTMENT | CUTANEOUS | Status: DC | PRN
  Administered 2013-01-02: 1 via TOPICAL

## 2013-01-02 MED ORDER — COCAINE HCL POWD
Status: DC | PRN
  Administered 2013-01-02: 200 mg via NASAL

## 2013-01-02 MED ORDER — IRBESARTAN 300 MG PO TABS
300.0000 mg | ORAL_TABLET | Freq: Every day | ORAL | Status: DC
  Administered 2013-01-03: 300 mg via ORAL
  Filled 2013-01-02: qty 1

## 2013-01-02 MED ORDER — VALSARTAN-HYDROCHLOROTHIAZIDE 320-25 MG PO TABS
1.0000 | ORAL_TABLET | Freq: Every day | ORAL | Status: DC
Start: 2013-01-02 — End: 2013-01-02

## 2013-01-02 MED ORDER — MIDAZOLAM HCL 2 MG/2ML IJ SOLN: 0.5000 mg | Freq: Once | INTRAMUSCULAR | Status: DC | PRN

## 2013-01-02 MED ORDER — WHITE PETROLATUM GEL
Status: AC
  Filled 2013-01-02: qty 5

## 2013-01-02 MED ORDER — PHENTERMINE HCL 30 MG PO CAPS: 30.0000 mg | ORAL_CAPSULE | ORAL | Status: DC

## 2013-01-02 MED ORDER — ONDANSETRON HCL 4 MG PO TABS: 4.0000 mg | ORAL_TABLET | ORAL | Status: DC | PRN

## 2013-01-02 MED ORDER — MINERAL OIL LIGHT 100 % EX OIL
TOPICAL_OIL | CUTANEOUS | Status: AC
  Filled 2013-01-02: qty 25

## 2013-01-02 MED ORDER — MINERAL OIL LIGHT 100 % EX OIL
TOPICAL_OIL | CUTANEOUS | Status: DC | PRN
  Administered 2013-01-02: 1 via TOPICAL

## 2013-01-02 MED ORDER — AMLODIPINE BESYLATE 10 MG PO TABS
10.0000 mg | ORAL_TABLET | Freq: Every day | ORAL | Status: DC
  Administered 2013-01-03: 10 mg via ORAL
  Filled 2013-01-02: qty 1

## 2013-01-02 MED ORDER — PROPOFOL 10 MG/ML IV BOLUS
INTRAVENOUS | Status: DC | PRN
  Administered 2013-01-02: 150 mg via INTRAVENOUS

## 2013-01-02 MED ORDER — NEOSTIGMINE METHYLSULFATE 1 MG/ML IJ SOLN
INTRAMUSCULAR | Status: DC | PRN
  Administered 2013-01-02: 4 mg via INTRAVENOUS

## 2013-01-02 MED ORDER — OXYCODONE-ACETAMINOPHEN 5-325 MG PO TABS: 1.0000 | ORAL_TABLET | ORAL | Status: DC | PRN

## 2013-01-02 MED ORDER — LIDOCAINE-EPINEPHRINE 1 %-1:100000 IJ SOLN
INTRAMUSCULAR | Status: AC
  Filled 2013-01-02: qty 1

## 2013-01-02 MED ORDER — MEPERIDINE HCL 25 MG/ML IJ SOLN: 6.2500 mg | INTRAMUSCULAR | Status: DC | PRN

## 2013-01-02 MED ORDER — HEPARIN SODIUM (PORCINE) 5000 UNIT/ML IJ SOLN
5000.0000 [IU] | Freq: Three times a day (TID) | INTRAMUSCULAR | Status: DC
  Administered 2013-01-02 – 2013-01-03 (×2): 5000 [IU] via SUBCUTANEOUS
  Filled 2013-01-02 (×6): qty 1

## 2013-01-02 MED ORDER — BACITRACIN ZINC 500 UNIT/GM EX OINT
TOPICAL_OINTMENT | CUTANEOUS | Status: AC
  Filled 2013-01-02: qty 15

## 2013-01-02 MED ORDER — LACTATED RINGERS IV SOLN
INTRAVENOUS | Status: DC | PRN
  Administered 2013-01-02 (×2): via INTRAVENOUS

## 2013-01-02 MED ORDER — 0.9 % SODIUM CHLORIDE (POUR BTL) OPTIME
TOPICAL | Status: DC | PRN
  Administered 2013-01-02: 1000 mL

## 2013-01-02 MED ORDER — IBUPROFEN 800 MG PO TABS
800.0000 mg | ORAL_TABLET | Freq: Three times a day (TID) | ORAL | Status: DC | PRN
  Filled 2013-01-02: qty 1

## 2013-01-02 MED ORDER — MORPHINE SULFATE 2 MG/ML IJ SOLN: 2.0000 mg | INTRAMUSCULAR | Status: DC | PRN

## 2013-01-02 MED ORDER — CEFAZOLIN SODIUM-DEXTROSE 2-3 GM-% IV SOLR
2.0000 g | INTRAVENOUS | Status: AC
  Administered 2013-01-02: 2 g via INTRAVENOUS
  Filled 2013-01-02: qty 50

## 2013-01-02 MED ORDER — ONDANSETRON HCL 4 MG/2ML IJ SOLN
INTRAMUSCULAR | Status: DC | PRN
  Administered 2013-01-02: 4 mg via INTRAVENOUS

## 2013-01-02 MED ORDER — OXYCODONE HCL 5 MG PO TABS: 5.0000 mg | ORAL_TABLET | Freq: Once | ORAL | Status: DC | PRN

## 2013-01-02 MED ORDER — HYDROCODONE-ACETAMINOPHEN 5-325 MG PO TABS
1.0000 | ORAL_TABLET | ORAL | Status: DC | PRN
  Administered 2013-01-02: 1 via ORAL
  Administered 2013-01-02: 2 via ORAL
  Administered 2013-01-02 – 2013-01-03 (×3): 1 via ORAL
  Filled 2013-01-02 (×3): qty 1
  Filled 2013-01-02: qty 2
  Filled 2013-01-02: qty 1

## 2013-01-02 MED ORDER — EPHEDRINE SULFATE 50 MG/ML IJ SOLN
INTRAMUSCULAR | Status: DC | PRN
  Administered 2013-01-02 (×2): 25 mg via INTRAVENOUS

## 2013-01-02 MED ORDER — HYDROMORPHONE HCL PF 1 MG/ML IJ SOLN
INTRAMUSCULAR | Status: AC
  Administered 2013-01-02: 0.5 mg via INTRAVENOUS
  Filled 2013-01-02: qty 1

## 2013-01-02 MED ORDER — COCAINE HCL POWD
Status: AC
  Filled 2013-01-02: qty 200

## 2013-01-02 MED ORDER — PHENYLEPHRINE HCL 10 MG/ML IJ SOLN
INTRAMUSCULAR | Status: DC | PRN
  Administered 2013-01-02 (×2): 80 ug via INTRAVENOUS

## 2013-01-02 MED ORDER — PROMETHAZINE HCL 25 MG/ML IJ SOLN: 6.2500 mg | INTRAMUSCULAR | Status: DC | PRN

## 2013-01-02 MED ORDER — HYDROCHLOROTHIAZIDE 25 MG PO TABS
25.0000 mg | ORAL_TABLET | Freq: Every day | ORAL | Status: DC
  Filled 2013-01-02: qty 1

## 2013-01-02 MED ORDER — DEXTROSE-NACL 5-0.45 % IV SOLN
INTRAVENOUS | Status: DC
  Administered 2013-01-02 – 2013-01-03 (×3): via INTRAVENOUS

## 2013-01-02 MED ORDER — LIDOCAINE HCL (CARDIAC) 20 MG/ML IV SOLN
INTRAVENOUS | Status: DC | PRN
  Administered 2013-01-02: 20 mg via INTRAVENOUS

## 2013-01-02 MED ORDER — HYDROCHLOROTHIAZIDE 25 MG PO TABS
25.0000 mg | ORAL_TABLET | Freq: Every day | ORAL | Status: DC
  Administered 2013-01-03: 25 mg via ORAL
  Filled 2013-01-02: qty 1

## 2013-01-02 MED ORDER — FENTANYL CITRATE 0.05 MG/ML IJ SOLN
INTRAMUSCULAR | Status: DC | PRN
  Administered 2013-01-02: 100 ug via INTRAVENOUS
  Administered 2013-01-02: 150 ug via INTRAVENOUS

## 2013-01-02 MED ORDER — ONDANSETRON HCL 4 MG/2ML IJ SOLN: 4.0000 mg | INTRAMUSCULAR | Status: DC | PRN

## 2013-01-02 MED ORDER — MIDAZOLAM HCL 5 MG/5ML IJ SOLN
INTRAMUSCULAR | Status: DC | PRN
  Administered 2013-01-02: 2 mg via INTRAVENOUS

## 2013-01-02 MED ORDER — ARTIFICIAL TEARS OP OINT
TOPICAL_OINTMENT | OPHTHALMIC | Status: DC | PRN
  Administered 2013-01-02: 1 via OPHTHALMIC

## 2013-01-02 MED ORDER — GLYCOPYRROLATE 0.2 MG/ML IJ SOLN
INTRAMUSCULAR | Status: DC | PRN
  Administered 2013-01-02: 0.6 mg via INTRAVENOUS

## 2013-01-02 MED ORDER — HYDROMORPHONE HCL PF 1 MG/ML IJ SOLN
0.2500 mg | INTRAMUSCULAR | Status: DC | PRN
  Administered 2013-01-02 (×2): 0.5 mg via INTRAVENOUS

## 2013-01-02 MED ORDER — LIDOCAINE-EPINEPHRINE 1 %-1:100000 IJ SOLN
INTRAMUSCULAR | Status: DC | PRN
  Administered 2013-01-02: 20 mL

## 2013-01-02 SURGICAL SUPPLY — 41 items
ATTRACTOMAT 16X20 MAGNETIC DRP (DRAPES) ×2 IMPLANT
CANISTER SUCTION 2500CC (MISCELLANEOUS) ×2 IMPLANT
CLOTH BEACON ORANGE TIMEOUT ST (SAFETY) IMPLANT
COAGULATOR SUCT SWTCH 10FR 6 (ELECTROSURGICAL) ×2 IMPLANT
COTTONBALL LRG STERILE PKG (GAUZE/BANDAGES/DRESSINGS) ×2 IMPLANT
CRADLE DONUT ADULT HEAD (MISCELLANEOUS) ×2 IMPLANT
DECANTER SPIKE VIAL GLASS SM (MISCELLANEOUS) ×2 IMPLANT
DRESSING TELFA 8X3 (GAUZE/BANDAGES/DRESSINGS) ×2 IMPLANT
DRSG NASOPORE 8CM (GAUZE/BANDAGES/DRESSINGS) IMPLANT
ELECT REM PT RETURN 9FT ADLT (ELECTROSURGICAL) ×2
ELECTRODE REM PT RTRN 9FT ADLT (ELECTROSURGICAL) ×1 IMPLANT
FILTER ARTHROSCOPY CONVERTOR (FILTER) IMPLANT
GAUZE PACKING FOLDED 2  STR (GAUZE/BANDAGES/DRESSINGS) ×1
GAUZE PACKING FOLDED 2 STR (GAUZE/BANDAGES/DRESSINGS) ×1 IMPLANT
GAUZE SPONGE 2X2 8PLY STRL LF (GAUZE/BANDAGES/DRESSINGS) ×1 IMPLANT
GEL ULTRASOUND 8.5O AQUASONIC (MISCELLANEOUS) ×2 IMPLANT
GLOVE BIO SURGEON STRL SZ7.5 (GLOVE) ×2 IMPLANT
GLOVE BIOGEL PI IND STRL 7.5 (GLOVE) ×3 IMPLANT
GLOVE BIOGEL PI IND STRL 8 (GLOVE) ×1 IMPLANT
GLOVE BIOGEL PI INDICATOR 7.5 (GLOVE) ×3
GLOVE BIOGEL PI INDICATOR 8 (GLOVE) ×1
GLOVE ECLIPSE 8.0 STRL XLNG CF (GLOVE) ×4 IMPLANT
GOWN PREVENTION PLUS XLARGE (GOWN DISPOSABLE) ×2 IMPLANT
GOWN STRL NON-REIN LRG LVL3 (GOWN DISPOSABLE) ×2 IMPLANT
KIT BASIN OR (CUSTOM PROCEDURE TRAY) ×2 IMPLANT
KIT ROOM TURNOVER OR (KITS) ×2 IMPLANT
NEEDLE SPNL 25GX3.5 QUINCKE BL (NEEDLE) ×2 IMPLANT
NS IRRIG 1000ML POUR BTL (IV SOLUTION) ×2 IMPLANT
PAD ARMBOARD 7.5X6 YLW CONV (MISCELLANEOUS) ×2 IMPLANT
PATTIES SURGICAL .5 X3 (DISPOSABLE) ×2 IMPLANT
SHEET SIL 040 (INSTRUMENTS) ×2 IMPLANT
SPECIMEN JAR SMALL (MISCELLANEOUS) IMPLANT
SPONGE GAUZE 2X2 STER 10/PKG (GAUZE/BANDAGES/DRESSINGS) ×1
SUT CHROMIC 4 0 P 3 18 (SUTURE) ×2 IMPLANT
SUT ETHILON 3 0 PS 1 (SUTURE) ×2 IMPLANT
SUT PDS AB 4-0 P3 18 (SUTURE) ×2 IMPLANT
SUT PLAIN 4 0 ~~LOC~~ 1 (SUTURE) ×2 IMPLANT
TOWEL OR 17X24 6PK STRL BLUE (TOWEL DISPOSABLE) ×2 IMPLANT
TOWEL OR NON WOVEN STRL DISP B (DISPOSABLE) ×2 IMPLANT
TRAY ENT MC OR (CUSTOM PROCEDURE TRAY) ×2 IMPLANT
WATER STERILE IRR 1000ML POUR (IV SOLUTION) IMPLANT

## 2013-01-02 NOTE — Interval H&P Note (Signed)
History and Physical Interval Note:  01/02/2013 8:33 AM  Luis Mccann  has presented today for surgery, with the diagnosis of DEVIATED NASAL SEPTUM AND HYPTROPHIC TURBINATE  The various methods of treatment have been discussed with the patient and family. After consideration of risks, benefits and other options for treatment, the patient has consented to  Procedure(s): NASAL SEPTOPLASTY WITH TURBINATE REDUCTION (N/A) as a surgical intervention .  The patient's history has been re-reviewed, patient re-examined, no change in status, stable for surgery.  I have re-reviewed the patient's chart and labs.  Questions were answered to the patient's satisfaction.     Flo Shanks

## 2013-01-02 NOTE — Op Note (Signed)
01/02/2013  10:53 AM    Luis Mccann  [id number]   Pre-Op Dx:  Deviated Nasal Septum, Hypertrophic Inferior Turbinates,Obstructive sleep apnea  Post-op Dx: Same  Proc: Nasal Septoplasty, Bilateral SMR Inferior Turbinates   Surg:  Flo Shanks T MD  Anes:  GOT  EBL:  Minimal  Comp:  None  Findings:  Scarring and some cartilage regrowth consistent with a prior septoplasty. Overall rightward septal deviation with obstruction in the Right nasal vestibule.  Bulky inferior turbinates, right greater than left.  Procedure: With the patient in a comfortable supine position,  general orotracheal anesthesia was induced without difficulty.     The patient received preoperative Afrin spray for topical decongestion and vasoconstriction.  Intravenous prophylactic antibiotics were administered.  At an appropriate level, the patient was placed in a semi-sitting position.  A saline moistened throat pack was placed.  Nasal vibrissae were trimmed.  Cocaine crystals, 200 mg total,  were applied on cotton carriers to the anterior ethmoid and sphenopalatine ganglion regions on both sides.  Afrin solution was applied on 0.5" x 3" cottonoids to both sides of the septal mucosa.   1% Xylocaine with 1:100,000 epinephrine, 10 cc's, was infiltrated into the anterior floor of the nose, into the nasal spine region, into the membranous columella, and finally into the submucoperichondrial plane of the septum on both sides.  Several minutes were allowed for this to take effect.  A sterile preparation and draping of the midface was accomplished in the standard fashion.  The materials were removed from the nose and observed to be intact and correct in number.  The nose was inspected with a headlight with the findings as described above.  A Right hemitransfixion incision was sharply executed and carried down to the caudal edge of the quadrangular cartilage and continued to a floor incision.  An opposite small floor  incision was sharply executed as well.   Floor tunnels were elevated on both sides, carried posteriorly, then medially, then brought forward along the vomer and maxillary crest.  The submucoperichondrial plane of the  Right septum was dissected up to the dorsum of the nose, back onto the perpendicular plate, and brought down and communicated with a floor tunnel and then forward along the maxillary crest.  There was a small linear rent in the inferior left flap at the level of the maxillary crest.  The chondroethmoid junction was identified and opened with a Risk analyst.  The opposite submucoperiosteal plane of the perpendicular plate of the ethmoid  was elevated and carried down to the floor tunnel posteriorly.  The superior perpendicular plate was lysed with an open Jansen-Middleton forceps.  The inferior portion was dissected from the maxillary crest and vomer with a Cottle elevator.  The midportion was rocked free with a closed [employer] forceps and then delivered.    The posterior inferior corner of the quadrangular cartilage was submucosally resected, including a cartilaginous tail up along the vomer. Heavy cartilage bossing in the superior nose was removed submucosally with an open Jansen-Middleton forceps.    The septum was mobilized superiorly by freeing it from the upper lateral cartilages sharply on both sides.  After mobilizing the septum adequately, and straightening it in the standard fashion,  The septum was secured to the nasal spine with a figure-of-eight 4-0 PDS suture.  A good straight midline configuration of the septum with good dorsal support was generated.  The septal tunnel was suctioned clear.  Hemostasis was observed.  The flaps were laid back  down.  The flaps were quilted with a continuous 4-0 plain gut stitch.The incisions were closed with interrupted 4-0 chromic suture.  Hemostasis was observed.   A 2 x 2.5 cm posterior perforation was generated on the membranous  septum just below the sphenoid rostrum through and through. An open Jansen-Middleton forceps was used. Bone chips were carefully removed to allow prompt healing.  Just prior to completing the septoplasty, the inferior turbinates were each infiltrated with additional 1% Xylocaine with 1:100,000 epinephrine,  6 cc's total.  Upon completing the septoplasty, beginning on the RIGHT side, the inferior turbinate was inspected and infractured.  The anterior hood of the inferior turbinate was sharply lysed just behind the nasal valve.  The medial mucosa of the inferior turbinate was incised in an  anterior upsloping fashion and a laterally based flap was developed from the turbinate bone.  Using angled turbinate scissors, turbinate bone and lateral mucosa were resected in a posterior downsloping fashion, taking much of the anterior pole and leaving most of the posterior pole.  Bony spicules were submucosally dissected and removed.  The mucosal flap was laid back down and the turbinate was outfractured.  This completed one SMR inferior turbinate.  The opposite side was performed in identical fashion.The cut mucosal edges and the bulbous posterior pole especially on the right side were coagulated for hemostasis. The cut edges of the perforation were also coagulated.  Again hemostasis was observed.  After completing both turbinate resections, 0.040" reinforced Silastic splints were fashioned, placed against the nasal septum for support, and secured thereto with a 3-0 Ethilon stitch.   Telfa packs impregnated with bacitracin ointment were placed between the septum and the inferior turbinates, one on each side, for hemostasis and support.     At this point the procedure was completed.  The pharynx was suctioned free and the throat pack was removed.   The patient was returned to anesthesia, awakened, extubated, and transferred to recovery in stable condition.  Dispo:   PACU to home  Plan: Ice, elevation, narcotic  analgesia, prophylactic antibiotics for the duration of indwelling nasal foreign bodies.  We will remove the nasal packing In one day, the septal splints in 10 days.  Return to work in 10 days, strenuous activities in two weeks.  Will observe 23 hours observation with oxygen saturation monitoring given his known sleep apnea.  Cephus Richer MD

## 2013-01-02 NOTE — Plan of Care (Signed)
Problem: Diagnosis - Type of Surgery Goal: General Surgical Patient Education (See Patient Education module for education specifics) UP3

## 2013-01-02 NOTE — Anesthesia Preprocedure Evaluation (Addendum)
Anesthesia Evaluation  Patient identified by MRN, date of birth, ID band Patient awake    Reviewed: Allergy & Precautions, H&P , NPO status , Patient's Chart, lab work & pertinent test results  History of Anesthesia Complications Negative for: history of anesthetic complications  Airway Mallampati: II TM Distance: >3 FB Neck ROM: Full    Dental  (+) Teeth Intact and Dental Advisory Given   Pulmonary sleep apnea (for septoplasty today) , former smoker,  breath sounds clear to auscultation  Pulmonary exam normal       Cardiovascular hypertension, Pt. on medications + dysrhythmias (single episode of Afib) Atrial Fibrillation Rhythm:Regular Rate:Normal     Neuro/Psych negative neurological ROS     GI/Hepatic negative GI ROS, Neg liver ROS,   Endo/Other    Renal/GU Renal diseasenegative Renal ROS     Musculoskeletal   Abdominal   Peds  Hematology   Anesthesia Other Findings   Reproductive/Obstetrics                          Anesthesia Physical Anesthesia Plan  ASA: III  Anesthesia Plan: General   Post-op Pain Management:    Induction: Intravenous  Airway Management Planned: Oral ETT  Additional Equipment:   Intra-op Plan:   Post-operative Plan: Extubation in OR  Informed Consent: I have reviewed the patients History and Physical, chart, labs and discussed the procedure including the risks, benefits and alternatives for the proposed anesthesia with the patient or authorized representative who has indicated his/her understanding and acceptance.   Dental advisory given  Plan Discussed with: CRNA and Surgeon  Anesthesia Plan Comments: (Plan routine monitors, GETA)       Anesthesia Quick Evaluation

## 2013-01-02 NOTE — Anesthesia Postprocedure Evaluation (Signed)
  Anesthesia Post-op Note  Patient: Luis Mccann  Procedure(s) Performed: Procedure(s): NASAL SEPTOPLASTY WITH TURBINATE REDUCTION (Bilateral)  Patient Location: PACU  Anesthesia Type:General  Level of Consciousness: awake, alert , oriented and patient cooperative  Airway and Oxygen Therapy: Patient Spontanous Breathing  Post-op Pain: mild  Post-op Assessment: Post-op Vital signs reviewed, Patient's Cardiovascular Status Stable, Respiratory Function Stable, Patent Airway, No signs of Nausea or vomiting and Pain level controlled  Post-op Vital Signs: Reviewed and stable  Complications: No apparent anesthesia complications

## 2013-01-02 NOTE — H&P (View-Only) (Signed)
Luis Mccann,  Luis Mccann 57 y.o., male [id number]     Chief Complaint: Nasal obstruction, sleep apnea  HPI: 6 months recheck. Preoperative visit. We are planning septoplasty and reduction of turbinates. As per previous discussions, we are going to try to avoid any sort of palatoplasty. The previous sleep study showed apnea hypopnea index almost 90.  He thinks this was mostly an artifact of the test conditions, and that his sleep apnea is typically not as bad.  He is anxious to see if we may be able to cure him by improving his nasal airway. I explained that for severe sleep apnea, surgery alone, especially nasal surgery, is really sufficient. He does think that his snoring is greatly improved when he is able to close his mouth and breath through his nose.  He uses Afrin most days twice to help in breathing the daytime, and to help him sleep at night. He did have a prior septoplasty with Dr. Lawrence 30 years ago.   I discussed the surgery, namely septoplasty and reduction of turbinates in detail including risks and complications. Questions were answered and informed consent was obtained. I may attempt to generate a posterior septal perforation to prevent motion of the membranous septum.  PMH: Past Medical History  Diagnosis Date  . Kidney stones   . Hypertension   . Sleep apnea   . Arthritis     osteoarthritis  . Dysrhythmia 2000    single episode of afib with RVR with normal echo and rare atrial/ventricular ectopy on f/u Holter ([facility])    Surg Hx: Past Surgical History  Procedure Laterality Date  . Joint replacement  1995    right hip  . Colonoscopy    . Lithotripsy      x4  . Kidney stone surgery    . Nasal septum surgery    . Achilles tendon repair  1987 and 1990  . Shoulder surgery Left     for burr  . Shoulder surgery Right     abuttment and rotator cuff repair    FHx:  No family history on file. SocHx:  reports that he has quit smoking. He has never used smokeless tobacco. He  reports that  drinks alcohol. He reports that he does not use illicit drugs.  ALLERGIES: No Known Allergies    There were no vitals taken for this visit.  PHYSICAL EXAM: He is stocky and muscular and not particularly overweight. Mental status is appropriate. He hears well and conversational speech. Voice is clear and respirations unlabored to nose and mouth. The head is atraumatic and neck supple. Cranial nerves intact. Ear canals are clear with normal drums. The internal nose shows a leftward septal deviation and bulky turbinates which are reduced following Afrin decongestion. There is some motion of the posterior membranous septum consistent with loss of structural support. No polyps or active drainage. Oral cavity reveals a bulky tongue and mandible and maxilla of normal configuration. He has a very long soft palate and uvula. Neck unremarkable.   Lungs: Clear to auscultation  Heart: Regular rate and rhythm with no murmurs Abdomen: Soft, active Extremities: Normal configuration Neurologic: Symmetric, grossly intact.    Assessment/Plan Deviated nasal septum (470) (J34.2). Hypertrophy of nasal turbinates (478.0) (J34.3). Obstructive sleep apnea, adult (327.23) (G47.33).  You are all set for your surgery.  No aspirin containing compounds for 10 days before surgery. you  will spend overnight with us in the hospital after your surgery one night because you have sleep apnea.  I   will remove the nasal packing the following morning before you go home. See the nasal hygiene instructions regarding keeping the nose moist. I am giving you  prescriptions today for Keflex antibiotic pills, oxycodone strong pain medication, and hydrocodone moderate pain medication.  After you go home, I will see you back here in 10 days.  You can try to go back to work between 7 and 10 days. No strenuous activities for 2 weeks.  Cephalexin 500 MG Oral Capsule;TAKE 1 CAPSULE 4 TIMES DAILY; Qty40; R0;  Rx. Hydrocodone-Acetaminophen 5-325 MG Oral Tablet;TAKE 1 TO 2 TABLETS EVERY 4 TO 6 HOURS AS NEEDED FOR PAIN; Qty30; R2; Rx. Oxycodone-Acetaminophen 5-325 MG Oral Tablet;TAKE 1 TO 2 TABLETS EVERY 4 TO 6 HOURS AS NEEDED FOR PAIN; Qty30; R0; Rx.  Suede Greenawalt 01/01/2013, 5:37 PM     

## 2013-01-02 NOTE — Preoperative (Signed)
Beta Blockers   Reason not to administer Beta Blockers:Not Applicable 

## 2013-01-02 NOTE — Transfer of Care (Signed)
Immediate Anesthesia Transfer of Care Note  Patient: Luis Mccann  Procedure(s) Performed: Procedure(s): NASAL SEPTOPLASTY WITH TURBINATE REDUCTION (Bilateral)  Patient Location: PACU  Anesthesia Type:General  Level of Consciousness: awake and alert   Airway & Oxygen Therapy: Patient Spontanous Breathing and Patient connected to face mask oxygen  Post-op Assessment: Report given to PACU RN, Post -op Vital signs reviewed and stable and Patient moving all extremities  Post vital signs: Reviewed and stable  Complications: No apparent anesthesia complications

## 2013-01-02 NOTE — OR Nursing (Signed)
Remainder of Afrin nasal spray used intraop on sterile field to soak patties.

## 2013-01-03 ENCOUNTER — Encounter (HOSPITAL_COMMUNITY): Payer: Self-pay | Admitting: Otolaryngology

## 2013-01-03 NOTE — Progress Notes (Signed)
Pt HR dropping into the 30-40's, sinus brady. Pt asymptomatic, BP 143/87. Pt awake and oriented. MD on call Jearld Fenton made aware. No new orders at this time. Will continue to monitor.

## 2013-01-03 NOTE — Progress Notes (Signed)
Pt discharged per MD order. All discharge instructions were reviewed and all questions answered.

## 2013-01-03 NOTE — Progress Notes (Signed)
01/03/2013 9:05 AM  Luis Mccann [id number]  Post-Op Day 1, Discharge note    Temp:  [97.3 F (36.3 C)-98.6 F (37 C)] 98.2 F (36.8 C) (09/04 0738) Pulse Rate:  [43-87] 59 (09/04 0738) Resp:  [9-19] 19 (09/04 0738) BP: (115-174)/(60-96) 130/79 mmHg (09/04 0738) SpO2:  [92 %-100 %] 97 % (09/04 0738) Weight:  [92.2 kg (203 lb 4.2 oz)] 92.2 kg (203 lb 4.2 oz) (09/03 1300),     Intake/Output Summary (Last 24 hours) at 01/03/13 0905 Last data filed at 01/03/13 0800  Gross per 24 hour  Intake 3616.67 ml  Output   2275 ml  Net 1341.67 ml    Results for orders placed during the hospital encounter of 01/02/13 (from the past 24 hour(s))  CBC     Status: Abnormal   Collection Time    01/02/13  1:53 PM      Result Value Range   WBC 8.3  4.0 - 10.5 K/uL   RBC 4.68  4.22 - 5.81 MIL/uL   Hemoglobin 14.5  13.0 - 17.0 g/dL   HCT 40.9  81.1 - 91.4 %   MCV 85.0  78.0 - 100.0 fL   MCH 31.0  26.0 - 34.0 pg   MCHC 36.4 (*) 30.0 - 36.0 g/dL   RDW 78.2  95.6 - 21.3 %   Platelets 197  150 - 400 K/uL  CREATININE, SERUM     Status: Abnormal   Collection Time    01/02/13  1:53 PM      Result Value Range   Creatinine, Ser 1.07  0.50 - 1.35 mg/dL   GFR calc non Af Amer 75 (*) >90 mL/min   GFR calc Af Amer 87 (*) >90 mL/min    SUBJECTIVE:  Pain controlled.  Breathing OK.  Eating and drinking OK.  OBJECTIVE:  Min bleeding.  Packs removed without diff.    IMPRESSION:  Satisfactory check  PLAN:  Discharge home.  Admit:  3 SEP Discharge:  4 SEP  Final Dx:  Nasal septal deviation.  Hypertrophic turbinates.  Obstructive sleep apnea  Proc:  Nasal septoplasty, smr turbinates, 3 SEP  Comp:  None  Cond:  Ambulatory. Packs out.  Taking good po. Pain controlled.  R/v:  8 days  Rx:  Percocet, Vicodin, Keflex  Instructions written and given  Flo Shanks

## 2013-02-28 ENCOUNTER — Other Ambulatory Visit: Payer: Self-pay | Admitting: Cardiovascular Disease

## 2013-02-28 NOTE — Telephone Encounter (Signed)
Rx was sent to pharmacy electronically. 

## 2013-03-07 ENCOUNTER — Other Ambulatory Visit: Payer: Self-pay

## 2013-03-30 ENCOUNTER — Other Ambulatory Visit: Payer: Self-pay | Admitting: Cardiovascular Disease

## 2013-04-01 NOTE — Telephone Encounter (Signed)
Rx was sent to pharmacy electronically. 

## 2013-04-02 ENCOUNTER — Telehealth: Payer: Self-pay | Admitting: Cardiovascular Disease

## 2013-04-02 NOTE — Telephone Encounter (Signed)
rx amolophine only filled for 30 days  Was only given 30 day supply. Was told by pharmacy to call us and make appt.

## 2013-04-02 NOTE — Telephone Encounter (Signed)
Returned call.  Left message to call back before 4pm.  

## 2013-04-02 NOTE — Telephone Encounter (Signed)
Pt returned call and verified x 2.  Pt informed refill given for #30 w/o refills b/c it has been >1 year since last OV and he needs an appt for further refills.  Pt informed he can get more refills or 90-day supply at OV on 12.4.14.  Pt verbalized understanding and agreed w/ plan.

## 2013-04-04 ENCOUNTER — Other Ambulatory Visit: Payer: Self-pay | Admitting: *Deleted

## 2013-04-04 ENCOUNTER — Encounter: Payer: Self-pay | Admitting: Cardiovascular Disease

## 2013-04-04 ENCOUNTER — Ambulatory Visit (INDEPENDENT_AMBULATORY_CARE_PROVIDER_SITE_OTHER): Payer: BC Managed Care – PPO | Admitting: Cardiovascular Disease

## 2013-04-04 VITALS — BP 152/82 | HR 71 | Ht 69.0 in | Wt 205.0 lb

## 2013-04-04 DIAGNOSIS — G4733 Obstructive sleep apnea (adult) (pediatric): Secondary | ICD-10-CM | POA: Insufficient documentation

## 2013-04-04 DIAGNOSIS — E66811 Obesity, class 1: Secondary | ICD-10-CM

## 2013-04-04 DIAGNOSIS — I499 Cardiac arrhythmia, unspecified: Secondary | ICD-10-CM

## 2013-04-04 DIAGNOSIS — I10 Essential (primary) hypertension: Secondary | ICD-10-CM | POA: Insufficient documentation

## 2013-04-04 DIAGNOSIS — I4891 Unspecified atrial fibrillation: Secondary | ICD-10-CM

## 2013-04-04 DIAGNOSIS — N200 Calculus of kidney: Secondary | ICD-10-CM | POA: Insufficient documentation

## 2013-04-04 DIAGNOSIS — E669 Obesity, unspecified: Secondary | ICD-10-CM

## 2013-04-04 DIAGNOSIS — I480 Paroxysmal atrial fibrillation: Secondary | ICD-10-CM | POA: Insufficient documentation

## 2013-04-04 DIAGNOSIS — E663 Overweight: Secondary | ICD-10-CM | POA: Insufficient documentation

## 2013-04-04 HISTORY — DX: Obesity, class 1: E66.811

## 2013-04-04 HISTORY — DX: Obesity, unspecified: E66.9

## 2013-04-04 MED ORDER — VALSARTAN-HYDROCHLOROTHIAZIDE 320-25 MG PO TABS: 1.0000 | ORAL_TABLET | Freq: Every day | ORAL | Status: DC

## 2013-04-04 MED ORDER — AMLODIPINE BESYLATE 10 MG PO TABS: 10.0000 mg | ORAL_TABLET | Freq: Every day | ORAL | Status: DC

## 2013-04-04 NOTE — Assessment & Plan Note (Signed)
He continues to have a broad oscillations in his weight, but generally is moving in a positive direction. He remains mildly obese with a body mass index that is just over 30. His pants size is dramatically less. His target should be a waistline of 34 in. I don't think his cardiac problems precluded the use of anorexiant drugs, but this should be done cautiously, with supervision for development of arrhythmia or other cardiac problems .

## 2013-04-04 NOTE — Telephone Encounter (Signed)
Amlodipine and Diovan/HCT #90 day supply w/3 refills.

## 2013-04-04 NOTE — Assessment & Plan Note (Signed)
I am very happy that he believes that his obstructive sleep apnea has improved following his nasal surgery. I believe we should confirm this with appropriate testing, which I understand will be scheduled after his followup appointment with Dr.Wolicki. I told him that it is important to make sure he does not still require CPAP therapy. From a symptom perspective she does appear to be improved, without the daytime sleepiness but previously bothered him.

## 2013-04-04 NOTE — Assessment & Plan Note (Signed)
His blood pressure today is high, but he insists that this is an unusual occurrence. I think we should reevaluate his blood pressure in 6 months. If he does take his hydrochlorothiazide more conscientiously his blood pressure will improve anyway.

## 2013-04-04 NOTE — Assessment & Plan Note (Signed)
No clinically apparent recurrence since 2000. [place] score is low. Anticoagulants not indicated.

## 2013-04-04 NOTE — Patient Instructions (Signed)
Your physician encouraged you to lose weight for better health.  Your physician recommends that you schedule a follow-up appointment in: 6 months

## 2013-04-04 NOTE — Progress Notes (Signed)
Patient ID: Luis Mccann, male   DOB: [birth year], 57 y.o.   MRN: [id number]      Reason for office visit Atrial fibrillation, hypertension, obstructive sleep apnea Mr. Johnson is a 57 year old gentleman who first came under cardiology care when he presented with an episode of paroxysmal atrial fibrillation in the year 2000. At that time he was in the midst of a renal colic and also had some orthopedic problems. He was treated with beta blockers but did not tolerate these well due to fatigue. He has not had any atrial fibrillation recurrence that we are aware of in the last 14 years.  He was diagnosed with obstructive sleep apnea but did not really like using CPAP. He has just undergone a nasal surgery by Dr.Wolicki and he believes that this has had a tremendously positive impact on his apneic events. He has also lost some additional weight. I understand from his report that there is a plan to repeat a sleep study in the next 3 months or so.  His blood pressure is usually controlled, with readings are typically around 130/80. Today his blood pressure was high. He thinks this is because he has had some trouble at work and had a heated phone argument just before his appointment.  He continues to exercise regularly and has lost about 5 pounds of weight since his last appointment. He had lost down to 195 pounds, but as before his weight has seesaw back and forth. He remains mildly obese. He took phentermine for about a month, but then stopped it. He wants to try retaking again.  Has had recurrent nephrolithiasis and has been started by his urologist on one additional hydrochlorothiazide tablet in addition to his antihypertensive dose.   No Known Allergies  Current Outpatient Prescriptions  Medication Sig Dispense Refill  . amLODipine (NORVASC) 10 MG tablet TAKE 1 TABLET BY MOUTH EVERY DAY  30 tablet  0  . ferrous sulfate 325 (65 FE) MG tablet Take 325 mg by mouth 3 (three) times a week.      .  hydrochlorothiazide (HYDRODIURIL) 25 MG tablet Take 25 mg by mouth daily as needed.       Marland Kitchen ibuprofen (ADVIL,MOTRIN) 800 MG tablet Take 800 mg by mouth every 8 (eight) hours as needed for pain.      . phentermine 30 MG capsule Take 30 mg by mouth every morning.      . valsartan-hydrochlorothiazide (DIOVAN-HCT) 320-25 MG per tablet TAKE 1 TABLET DAILY.  30 tablet  0   No current facility-administered medications for this visit.    Past Medical History  Diagnosis Date  . Kidney stones   . Hypertension   . Sleep apnea   . Arthritis     osteoarthritis  . Dysrhythmia 2000    single episode of afib with RVR with normal echo and rare atrial/ventricular ectopy on f/u Holter ([facility])  . Paroxysmal atrial fibrillation 04/04/2013  . HTN (hypertension) 04/04/2013  . OSA (obstructive sleep apnea) 04/04/2013  . Obesity (BMI 30.0-34.9) 04/04/2013  . Nephrolithiasis 04/04/2013    Past Surgical History  Procedure Laterality Date  . Joint replacement  1995    right hip  . Colonoscopy    . Lithotripsy      x4  . Kidney stone surgery    . Nasal septum surgery    . Achilles tendon repair  1987 and 1990  . Shoulder surgery Left     for burr  . Shoulder surgery Right  abuttment and rotator cuff repair  . Nasal septoplasty w/ turbinoplasty Bilateral 01/02/2013    Procedure: NASAL SEPTOPLASTY WITH TURBINATE REDUCTION;  Surgeon: Flo Shanks, MD;  Location: [facility] OR;  Service: ENT;  Laterality: Bilateral;    No family history on file.  History   Social History  . Marital Status: Married    Spouse Name: N/A    Number of Children: N/A  . Years of Education: N/A   Occupational History  . Not on file.   Social History Main Topics  . Smoking status: Former Games developer  . Smokeless tobacco: Never Used  . Alcohol Use: Yes     Comment: socially  . Drug Use: No  . Sexual Activity: Not on file   Other Topics Concern  . Not on file   Social History Narrative  . No narrative on file    Review of  systems: The patient specifically denies any chest pain at rest or with exertion, dyspnea at rest or with exertion, orthopnea, paroxysmal nocturnal dyspnea, syncope, palpitations, focal neurological deficits, intermittent claudication, lower extremity edema, unexplained weight gain, cough, hemoptysis or wheezing.  The patient also denies abdominal pain, nausea, vomiting, dysphagia, diarrhea, constipation, polyuria, polydipsia, dysuria, hematuria, frequency, urgency, abnormal bleeding or bruising, fever, chills, unexpected weight changes, mood swings, change in skin or hair texture, change in voice quality, auditory or visual problems, allergic reactions or rashes, new musculoskeletal complaints other than usual "aches and pains".   PHYSICAL EXAM BP 152/82  Pulse 71  Ht 5\' 9"  (1.753 m)  Wt 205 lb (92.987 kg)  BMI 30.26 kg/m2  General: Alert, oriented x3, no distress Head: no evidence of trauma, PERRL, EOMI, no exophtalmos or lid lag, no myxedema, no xanthelasma; normal ears, nose and oropharynx Neck: normal jugular venous pulsations and no hepatojugular reflux; brisk carotid pulses without delay and no carotid bruits Chest: clear to auscultation, no signs of consolidation by percussion or palpation, normal fremitus, symmetrical and full respiratory excursions Cardiovascular: normal position and quality of the apical impulse, regular rhythm, normal first and second heart sounds, no murmurs, rubs or gallops Abdomen: no tenderness or distention, no masses by palpation, no abnormal pulsatility or arterial bruits, normal bowel sounds, no hepatosplenomegaly Extremities: no clubbing, cyanosis or edema; 2+ radial, ulnar and brachial pulses bilaterally; 2+ right femoral, posterior tibial and dorsalis pedis pulses; 2+ left femoral, posterior tibial and dorsalis pedis pulses; no subclavian or femoral bruits Neurological: grossly nonfocal   EKG: NSR, , left axis deviation, no change   BMET      Component Value Date/Time   NA 139 12/24/2012 0843   K 3.5 12/24/2012 0843   CL 101 12/24/2012 0843   CO2 29 12/24/2012 0843   GLUCOSE 109* 12/24/2012 0843   BUN 16 12/24/2012 0843   CREATININE 1.07 01/02/2013 1353   CALCIUM 10.6* 12/24/2012 0843   GFRNONAA 75* 01/02/2013 1353   GFRAA 87* 01/02/2013 1353     ASSESSMENT AND PLAN Paroxysmal atrial fibrillation No clinically apparent recurrence since 2000. [place] score is low. Anticoagulants not indicated.  OSA (obstructive sleep apnea) I am very happy that he believes that his obstructive sleep apnea has improved following his nasal surgery. I believe we should confirm this with appropriate testing, which I understand will be scheduled after his followup appointment with Dr.Wolicki. I told him that it is important to make sure he does not still require CPAP therapy. From a symptom perspective she does appear to be improved, without the daytime sleepiness but previously  bothered him.  Obesity (BMI 30.0-34.9) He continues to have a broad oscillations in his weight, but generally is moving in a positive direction. He remains mildly obese with a body mass index that is just over 30. His pants size is dramatically less. His target should be a waistline of 34 in. I don't think his cardiac problems precluded the use of anorexiant drugs, but this should be done cautiously, with supervision for development of arrhythmia or other cardiac problems .  Nephrolithiasis His blood pressure today is high, but he insists that this is an unusual occurrence. I think we should reevaluate his blood pressure in 6 months. If he does take his hydrochlorothiazide more conscientiously his blood pressure will improve anyway.  Orders Placed This Encounter  Procedures  . EKG 12-Lead   Meds ordered this encounter  Medications  . ferrous sulfate 325 (65 FE) MG tablet    Sig: Take 325 mg by mouth 3 (three) times a week.    Junious Silk, MD, [facility]  [facility] [phone] office [phone] pager

## 2013-04-08 ENCOUNTER — Encounter: Payer: Self-pay | Admitting: Cardiovascular Disease

## 2013-06-04 ENCOUNTER — Telehealth: Payer: Self-pay | Admitting: Cardiovascular Disease

## 2013-06-04 DIAGNOSIS — G4733 Obstructive sleep apnea (adult) (pediatric): Secondary | ICD-10-CM

## 2013-06-04 NOTE — Telephone Encounter (Signed)
Please call-concerning him having a sleep study.I think where he wanted it his insurance will not pay for it.

## 2013-06-05 NOTE — Telephone Encounter (Signed)
Returned call and pt verified x 2.  Pt stated Dr. Lazarus SalinesWolicki ordered his sleep test, but [employer] won't pay for an in-home sleep test and [facility].  Pt stated he wants to go to the one on [facility] [address] [facility] [address] and wants Dr. Royann Shiversroitoru to order it so he can go there.  Pt informed Dr. Royann Shiversroitoru will be notified of his request and he will be contacted if there are any issues.  Pt verbalized understanding and agreed w/ plan.  Pt would also like to defer the sleep study until April of this year, if that's okay w/ Dr. Royann Shiversroitoru.  Message forwarded to Dr. Ellwood Handlerroitoru./Barbara, CMA for review and orders, if approved.

## 2013-06-05 NOTE — Telephone Encounter (Signed)
OK to do sleep study at [facility] in April. Please order

## 2013-06-05 NOTE — Telephone Encounter (Signed)
Will defer to Barbara, CMA. 

## 2013-06-05 NOTE — Telephone Encounter (Signed)
Patient requesting to have his Sleep Study done at [facility].  The order has been put in for April.

## 2013-07-23 ENCOUNTER — Encounter (HOSPITAL_COMMUNITY): Payer: Self-pay | Admitting: Pharmacy Technician

## 2013-08-01 ENCOUNTER — Encounter (HOSPITAL_COMMUNITY): Payer: Self-pay | Admitting: *Deleted

## 2013-08-01 NOTE — Progress Notes (Signed)
Patient denies any chest pain or irregular heart beat. Patient speaks as if irritated that I am asking so many questions and" he had to put this on that form already." . I assured him I just am reviewing history to be safe and provide best care. States he has signed all his papers. Asked not to sign anything because it needs to be done here with nurse.

## 2013-08-09 ENCOUNTER — Other Ambulatory Visit: Payer: Self-pay | Admitting: Urology

## 2013-08-09 NOTE — H&P (Signed)
  History of Present Illness [name] is a 58 year old with the following urologic history:    1) Urolithiasis: He has been treated with ESWL three separate times in the past prior to being under my care. He has known hypercalciuia and mild hyperoxaluria. Medical treatment: HCTZ 25 mg po bid, dietary changes  Aug 2011: Left ureteroscopic laser lithotripsy (calcium oxalate dihydrate)   He has a known left renal calculus.    Past Medical History Problems  1. History of Atrial fibrillation (427.31) 2. History of anemia (V12.3) 3. History of hypertension (V12.59)  Surgical History Problems  1. History of Cystoscopy With Ureteroscopy With Lithotripsy 2. History of Primary Repair Of Ruptured Achilles Tendon 3. History of Rotator Cuff Repair 4. History of Total Hip Replacement 5. History of Upper Gastrointestinal Endoscopy (Therapeutic)  Current Meds 1. AmLODIPine Besylate 10 MG Oral Tablet;  Therapy: (Recorded:11Oct2013) to Recorded 2. Hydrochlorothiazide 25 MG Oral Tablet; TAKE 1 TABLET As Directed Late afternoon or  supper time.  You should also continue taking your regular blood pressure meds each  morning;  Therapy: 11Oct2013 to (Last Rx:11Oct2013)  Requested for: 11Oct2013 Ordered 3. Ibuprofen 800 MG Oral Tablet; TAKE TABLET  PRN;  Therapy: (Recorded:11Oct2013) to Recorded 4. Iron 325 (65 Fe) MG Oral Tablet;  Therapy: (Recorded:11Oct2013) to Recorded 5. Phentermine HCl - 37.5 MG Oral Capsule;  Therapy: (Recorded:14Jan2015) to Recorded 6. Valsartan-Hydrochlorothiazide 320-25 MG Oral Tablet;  Therapy: 19Jul2013 to Recorded  Allergies Medication  1. No Known Drug Allergies  Family History Problems  1. Family history of Cancer : Sister  Social History Problems  1. Alcohol Use   1/3 2. Marital History - Currently Married 3. Never A Smoker 4. Occupation:   Surveyor, mineralscontractor 5. Denied: Tobacco Use  Vitals  Height: 5 ft 8.5 in Weight: 203 lb  BMI Calculated:  30.42 BSA Calculated: 2.07   Physical Exam Constitutional: Well nourished and well developed . No acute distress.  ENT:. The ears and nose are normal in appearance.  Neck: The appearance of the neck is normal and no neck mass is present.  Pulmonary: No respiratory distress, normal respiratory rhythm and effort and clear bilateral breath sounds.  Cardiovascular: Heart rate and rhythm are normal . No peripheral edema.   Assessment Assessed  1. Nephrolithiasis (592.0)  Discussion/Summary  Left renal calculus:  We discussed the options of continued observation versus intervention. He is very interested in proceeding with proactive treatment and after reviewing the available treatment options, he is most interested in shockwave lithotripsy which he has previously undergone. We have reviewed the potential risks and complications and expected recovery process associated with this procedure.

## 2013-08-12 ENCOUNTER — Encounter (HOSPITAL_COMMUNITY): Payer: Self-pay | Admitting: General Practice

## 2013-08-12 ENCOUNTER — Ambulatory Visit (HOSPITAL_COMMUNITY)
Admission: RE | Admit: 2013-08-12 | Discharge: 2013-08-12 | Disposition: A | Discharge status: Home / Self Care | Payer: BC Managed Care – PPO | Source: Ambulatory Visit | Attending: Urology | Admitting: Urology

## 2013-08-12 ENCOUNTER — Encounter (HOSPITAL_COMMUNITY)
Admission: RE | Disposition: A | Discharge status: Home / Self Care | Payer: Self-pay | Source: Ambulatory Visit | Attending: Urology

## 2013-08-12 ENCOUNTER — Ambulatory Visit (HOSPITAL_COMMUNITY): Payer: BC Managed Care – PPO

## 2013-08-12 DIAGNOSIS — I10 Essential (primary) hypertension: Secondary | ICD-10-CM | POA: Insufficient documentation

## 2013-08-12 DIAGNOSIS — Z79899 Other long term (current) drug therapy: Secondary | ICD-10-CM | POA: Insufficient documentation

## 2013-08-12 DIAGNOSIS — G4730 Sleep apnea, unspecified: Secondary | ICD-10-CM | POA: Insufficient documentation

## 2013-08-12 DIAGNOSIS — N200 Calculus of kidney: Secondary | ICD-10-CM | POA: Insufficient documentation

## 2013-08-12 SURGERY — LITHOTRIPSY, ESWL
Anesthesia: LOCAL | Laterality: Left

## 2013-08-12 MED ORDER — CIPROFLOXACIN HCL 500 MG PO TABS
500.0000 mg | ORAL_TABLET | ORAL | Status: AC
  Administered 2013-08-12: 500 mg via ORAL
  Filled 2013-08-12: qty 1

## 2013-08-12 MED ORDER — DIPHENHYDRAMINE HCL 25 MG PO CAPS
25.0000 mg | ORAL_CAPSULE | ORAL | Status: AC
  Administered 2013-08-12: 25 mg via ORAL
  Filled 2013-08-12: qty 1

## 2013-08-12 MED ORDER — DEXTROSE-NACL 5-0.45 % IV SOLN
INTRAVENOUS | Status: DC
  Administered 2013-08-12: 14:00:00 via INTRAVENOUS

## 2013-08-12 MED ORDER — DIAZEPAM 5 MG PO TABS
10.0000 mg | ORAL_TABLET | ORAL | Status: AC
  Administered 2013-08-12: 10 mg via ORAL
  Filled 2013-08-12: qty 2

## 2013-08-12 NOTE — Op Note (Signed)
L ESWL  (operative note in paper chart)

## 2013-08-12 NOTE — Discharge Instructions (Signed)
1. You should strain your urine and collect all fragments and bring them to your follow up appointment.  °2. You should take your pain medication as needed.  Please call if your pain is severe to the point that it is not controlled with your pain medication. °3. You should call if you develop fever > 101 or persistent nausea or vomiting. °4. Your doctor may prescribe tamsulosin to take to help facilitate stone passage. °

## 2013-08-31 ENCOUNTER — Other Ambulatory Visit: Payer: Self-pay | Admitting: Orthopedic Surgery

## 2013-09-04 ENCOUNTER — Other Ambulatory Visit: Payer: Self-pay | Admitting: Surgical

## 2013-09-04 NOTE — H&P (Signed)
TOTAL HIP REVISION ADMISSION H&P  Patient is admitted for right revision total hip arthroplasty.  Subjective:  Chief Complaint: right hip pain  HPI: Luis Mccann, 58 y.o. male, has a history of pain and functional disability in the right hip due to impingement and patient has failed non-surgical conservative treatments for greater than 12 weeks to include NSAID's and/or analgesics, flexibility and strengthening excercises, supervised PT with diminished ADL's post treatment and activity modification. The indications for the revision total hip arthroplasty are bearing surface wear leading to  symptomatic synovitis.  Onset of symptoms was gradual starting 9 years ago with gradually worsening course since that time.  Prior procedures on the right hip include arthroscopy and arthroplasty.  Patient currently rates pain in the right hip at 7 out of 10 with activity.  There is night pain, worsening of pain with activity and weight bearing and pain that interfers with activities of daily living. Patient has no evidence of prosthetic loosening by imaging studies.  This condition presents safety issues increasing the risk of falls.  There is no current active infection.  Patient Active Problem List   Diagnosis Date Noted  . Paroxysmal atrial fibrillation- reports that he had one occurrence in the late 1990s 04/04/2013  . HTN (hypertension) 04/04/2013  . OSA (obstructive sleep apnea) 04/04/2013  . Obesity (BMI 30.0-34.9) 04/04/2013  . Nephrolithiasis 04/04/2013   Past Medical History  Diagnosis Date  . Kidney stones   . Hypertension   . Arthritis     osteoarthritis  . Dysrhythmia 2000    single episode of afib with RVR with normal echo and rare atrial/ventricular ectopy on f/u Holter ([facility])  . Paroxysmal atrial fibrillation 04/04/2013  . HTN (hypertension) 04/04/2013  . Obesity (BMI 30.0-34.9) 04/04/2013  . Nephrolithiasis 04/04/2013  . Sleep apnea     do not wear mask/  . OSA (obstructive sleep  apnea) 04/04/2013    Past Surgical History  Procedure Laterality Date  . Joint replacement  1995    right hip  . Colonoscopy    . Lithotripsy      x4  . Kidney stone surgery    . Nasal septum surgery    . Achilles tendon repair  1987 and 1990  . Shoulder surgery Left     for burr  . Shoulder surgery Right     abuttment and rotator cuff repair  . Nasal septoplasty w/ turbinoplasty Bilateral 01/02/2013    Procedure: NASAL SEPTOPLASTY WITH TURBINATE REDUCTION;  Surgeon: Flo ShanksKarol Wolicki, MD;  Location: [facility] OR;  Service: ENT;  Laterality: Bilateral;     Current outpatient prescriptions: amLODipine (NORVASC) 10 MG tablet, Take 10 mg by mouth every morning., Disp: , Rfl: ;   Ferrous Fumarate (IRON) 18 MG TBCR, Take 1 tablet by mouth daily., Disp: , Rfl: ;   valsartan-hydrochlorothiazide (DIOVAN-HCT) 320-25 MG per tablet, Take 1 tablet by mouth every morning., Disp: , Rfl:   No Known Allergies  History  Substance Use Topics  . Smoking status: Former Games developermoker  . Smokeless tobacco: Never Used  . Alcohol Use: Yes     Comment: socially      Review of Systems  Constitutional: Negative.   HENT: Negative.   Eyes: Negative.   Respiratory: Negative.   Cardiovascular: Negative.   Gastrointestinal: Negative.   Genitourinary: Negative.   Musculoskeletal: Positive for joint pain. Negative for back pain, falls, myalgias and neck pain.       Right hip pain  Skin: Negative.   Neurological:  Negative.   Endo/Heme/Allergies: Negative.   Psychiatric/Behavioral: Negative.     Objective:  Physical Exam  Constitutional: He is oriented to person, place, and time. He appears well-developed and well-nourished. No distress.  HENT:  Head: Normocephalic and atraumatic.  Right Ear: External ear normal.  Left Ear: External ear normal.  Nose: Nose normal.  Mouth/Throat: Oropharynx is clear and moist.  Eyes: Conjunctivae and EOM are normal.  Neck: Normal range of motion. Neck supple.  Cardiovascular:  Normal rate, regular rhythm, normal heart sounds and intact distal pulses.   No murmur heard. Respiratory: Effort normal and breath sounds normal. No respiratory distress. He has no wheezes.  GI: Soft. Bowel sounds are normal. He exhibits no distension. There is no tenderness.  Musculoskeletal:       Right hip: He exhibits decreased strength. He exhibits normal range of motion.       Left hip: Normal.       Right knee: Normal.       Left knee: Normal.       Right lower leg: He exhibits no tenderness and no swelling.       Left lower leg: He exhibits no tenderness and no swelling.  Evaluation of his right hip, flexion 125, rotation in 30, out 40, abduction 40 without pain on range of motion.  Neurological: He is alert and oriented to person, place, and time. He has normal strength and normal reflexes. No sensory deficit.  Skin: No rash noted. He is not diaphoretic. No erythema.  Psychiatric: He has a normal mood and affect. His behavior is normal.   Vitals Weight: 202.38 lb Height: 67.25 in Body Surface Area: 2.09 m Body Mass Index: 31.46 kg/m Pulse: 71 (Regular) BP: 154/91 (Sitting, Left Arm, Standard)  Imaging Review:  Plain radiographs demonstrate mild degenerative joint disease of the right hip(s). There is no evidence of loosening of the acetabular cup and femoral stem.The bone quality appears to be good for age and reported activity level. There is some clinical evidence of impingement perhaps due to femoral head component being too large.  Assessment/Plan:  End stage arthritis, right hip(s) with failed previous arthroplasty.  The patient history, physical examination, clinical judgement of the provider and imaging studies are consistent with end stage degenerative joint disease of the right hip(s), previous total hip arthroplasty. Revision total hip arthroplasty is deemed medically necessary. The treatment options including medical management, injection therapy,  arthroscopy and arthroplasty were discussed at length. The risks and benefits of total hip arthroplasty were presented and reviewed. The risks due to aseptic loosening, infection, stiffness, dislocation/subluxation,  thromboembolic complications and other imponderables were discussed.  The patient acknowledged the explanation, agreed to proceed with the plan and consent was signed. Patient is being admitted for inpatient treatment for surgery, pain control, PT, OT, prophylactic antibiotics, VTE prophylaxis, progressive ambulation and ADL's and discharge planning. The patient is planning to be discharged home with home health services     DriftwoodAmber Renatta Shrieves, [place]-C

## 2013-09-11 ENCOUNTER — Encounter (HOSPITAL_COMMUNITY): Payer: Self-pay | Admitting: Pharmacy Technician

## 2013-09-13 ENCOUNTER — Telehealth: Payer: Self-pay | Admitting: Cardiovascular Disease

## 2013-09-13 NOTE — Telephone Encounter (Signed)
Closed encounter °

## 2013-09-16 ENCOUNTER — Other Ambulatory Visit (HOSPITAL_COMMUNITY): Payer: BC Managed Care – PPO

## 2013-09-16 NOTE — Patient Instructions (Signed)
Luis Mccann  09/16/2013   Your procedure is scheduled on:09/24/13   Report to [facility] at 12:00 PM.  Call this number if you have problems the morning of surgery [phone]: [phone]   Remember:   Do not eat food After Midnight, clear liquids from midnight until 8:00am on 09/24/13 then nothing.     Take these medicines the morning of surgery with A SIP OF WATER: amlodipine   Do not wear jewelry, make-up or nail polish.  Do not wear lotions, powders, or perfumes. You may wear deodorant.  Do not shave 48 hours prior to surgery. Men may shave face and neck.  Do not bring valuables to the hospital.  Contacts, dentures or bridgework may not be worn into surgery.  Leave suitcase in the car. After surgery it may be brought to your room.  For patients admitted to the hospital, checkout time is 11:00 AM the day of discharge.   Please read over the following fact sheets that you were given: MRSA Information [name], RN  pre op nurse call if needed [phone]    [facility] - Preparing for Surgery Before surgery, you can play an important role.  Because skin is not sterile, your skin needs to be as free of germs as possible.  You can reduce the number of germs on your skin by washing with CHG (chlorahexidine gluconate) soap before surgery.  CHG is an antiseptic cleaner which kills germs and bonds with the skin to continue killing germs even after washing. Please DO NOT use if you have an allergy to CHG or antibacterial soaps.  If your skin becomes reddened/irritated stop using the CHG and inform your nurse when you arrive at Short Stay. Do not shave (including legs and underarms) for at least 48 hours prior to the first CHG shower.  You may shave your face. Please follow these instructions carefully:  1.  Shower with CHG Soap the night before surgery and the  morning of Surgery.  2.  If you choose to wash your hair, wash your hair first as usual with your  normal   shampoo.  3.  After you shampoo, rinse your hair and body thoroughly to remove the  shampoo.                            4.  Use CHG as you would any other liquid soap.  You can apply chg directly  to the skin and wash                       Gently with a scrungie or clean washcloth.  5.  Apply the CHG Soap to your body ONLY FROM THE NECK DOWN.   Do not use on open                           Wound or open sores. Avoid contact with eyes, ears mouth and genitals (private parts).                        Genitals (private parts) with your normal soap.             6.  Wash thoroughly, paying special attention to the area where your surgery  will be performed.  7.  Thoroughly rinse your body with warm water from the neck down.  8.  DO NOT shower/wash with your normal soap after using and rinsing off  the CHG Soap.                9.  Pat yourself dry with a clean towel.            10.  Wear clean pajamas.            11.  Place clean sheets on your bed the night of your first shower and do not  sleep with pets. Day of Surgery : Do not apply any lotions/deodorants the morning of surgery.  Please wear clean clothes to the hospital/surgery center.  FAILURE TO FOLLOW THESE INSTRUCTIONS MAY RESULT IN THE CANCELLATION OF YOUR SURGERY PATIENT SIGNATURE_________________________________  NURSE SIGNATURE__________________________________  ________________________________________________________________________    CLEAR LIQUID DIET   Foods Allowed                                                                     Foods Excluded  Coffee and tea, regular and decaf                             liquids that you cannot  Plain Jell-O in any flavor                                             see through such as: Fruit ices (not with fruit pulp)                                     milk, soups, orange juice  Iced Popsicles                                    All solid food Carbonated beverages, regular and diet                                     Cranberry, grape and apple juices Sports drinks like Gatorade Lightly seasoned clear broth or consume(fat free) Sugar, honey syrup  Sample Menu Breakfast                                Lunch                                     Supper Cranberry juice                    Beef broth                            Chicken broth Jell-O  Grape juice                           Apple juice Coffee or tea                        Jell-O                                      Popsicle                                                Coffee or tea                        Coffee or tea  _____________________________________________________________________   WHAT IS A BLOOD TRANSFUSION? Blood Transfusion Information  A transfusion is the replacement of blood or some of its parts. Blood is made up of multiple cells which provide different functions.  Red blood cells carry oxygen and are used for blood loss replacement.  White blood cells fight against infection.  Platelets control bleeding.  Plasma helps clot blood.  Other blood products are available for specialized needs, such as hemophilia or other clotting disorders. BEFORE THE TRANSFUSION  Who gives blood for transfusions?   Healthy volunteers who are fully evaluated to make sure their blood is safe. This is blood bank blood. Transfusion therapy is the safest it has ever been in the practice of medicine. Before blood is taken from a donor, a complete history is taken to make sure that person has no history of diseases nor engages in risky social behavior (examples are intravenous drug use or sexual activity with multiple partners). The donor's travel history is screened to minimize risk of transmitting infections, such as malaria. The donated blood is tested for signs of infectious diseases, such as HIV and hepatitis. The blood is then tested to be sure it is compatible with you in order to  minimize the chance of a transfusion reaction. If you or a relative donates blood, this is often done in anticipation of surgery and is not appropriate for emergency situations. It takes many days to process the donated blood. RISKS AND COMPLICATIONS Although transfusion therapy is very safe and saves many lives, the main dangers of transfusion include:   Getting an infectious disease.  Developing a transfusion reaction. This is an allergic reaction to something in the blood you were given. Every precaution is taken to prevent this. The decision to have a blood transfusion has been considered carefully by your caregiver before blood is given. Blood is not given unless the benefits outweigh the risks. AFTER THE TRANSFUSION  Right after receiving a blood transfusion, you will usually feel much better and more energetic. This is especially true if your red blood cells have gotten low (anemic). The transfusion raises the level of the red blood cells which carry oxygen, and this usually causes an energy increase.  The nurse administering the transfusion will monitor you carefully for complications. HOME CARE INSTRUCTIONS  No special instructions are needed after a transfusion. You may find your energy is better. Speak with your caregiver about any limitations on activity for underlying diseases you may have. SEEK MEDICAL CARE IF:  Your condition is not improving after your transfusion.  You develop redness or irritation at the intravenous (IV) site. SEEK IMMEDIATE MEDICAL CARE IF:  Any of the following symptoms occur over the next 12 hours:  Shaking chills.  You have a temperature by mouth above 102 F (38.9 C), not controlled by medicine.  Chest, back, or muscle pain.  People around you feel you are not acting correctly or are confused.  Shortness of breath or difficulty breathing.  Dizziness and fainting.  You get a rash or develop hives.  You have a decrease in urine  output.  Your urine turns a dark color or changes to pink, red, or brown. Any of the following symptoms occur over the next 10 days:  You have a temperature by mouth above 102 F (38.9 C), not controlled by medicine.  Shortness of breath.  Weakness after normal activity.  The white part of the eye turns yellow (jaundice).  You have a decrease in the amount of urine or are urinating less often.  Your urine turns a dark color or changes to pink, red, or brown. Document Released: 04/15/2000 Document Revised: 07/11/2011 Document Reviewed: 12/03/2007 ExitCare Patient Information 2014 [place], [place].  _______________________________________________________________________  Incentive Spirometer  An incentive spirometer is a tool that can help keep your lungs clear and active. This tool measures how well you are filling your lungs with each breath. Taking long deep breaths may help reverse or decrease the chance of developing breathing (pulmonary) problems (especially infection) following:  A long period of time when you are unable to move or be active. BEFORE THE PROCEDURE   If the spirometer includes an indicator to show your best effort, your nurse or respiratory therapist will set it to a desired goal.  If possible, sit up straight or lean slightly forward. Try not to slouch.  Hold the incentive spirometer in an upright position. INSTRUCTIONS FOR USE  1. Sit on the edge of your bed if possible, or sit up as far as you can in bed or on a chair. 2. Hold the incentive spirometer in an upright position. 3. Breathe out normally. 4. Place the mouthpiece in your mouth and seal your lips tightly around it. 5. Breathe in slowly and as deeply as possible, raising the piston or the ball toward the top of the column. 6. Hold your breath for 3-5 seconds or for as long as possible. Allow the piston or ball to fall to the bottom of the column. 7. Remove the mouthpiece from your mouth and  breathe out normally. 8. Rest for a few seconds and repeat Steps 1 through 7 at least 10 times every 1-2 hours when you are awake. Take your time and take a few normal breaths between deep breaths. 9. The spirometer may include an indicator to show your best effort. Use the indicator as a goal to work toward during each repetition. 10. After each set of 10 deep breaths, practice coughing to be sure your lungs are clear. If you have an incision (the cut made at the time of surgery), support your incision when coughing by placing a pillow or rolled up towels firmly against it. Once you are able to get out of bed, walk around indoors and cough well. You may stop using the incentive spirometer when instructed by your caregiver.  RISKS AND COMPLICATIONS  Take your time so you do not get dizzy or light-headed.  If you are in pain, you may need to take or ask  for pain medication before doing incentive spirometry. It is harder to take a deep breath if you are having pain. AFTER USE  Rest and breathe slowly and easily.  It can be helpful to keep track of a log of your progress. Your caregiver can provide you with a simple table to help with this. If you are using the spirometer at home, follow these instructions: SEEK MEDICAL CARE IF:   You are having difficultly using the spirometer.  You have trouble using the spirometer as often as instructed.  Your pain medication is not giving enough relief while using the spirometer.  You develop fever of 100.5 F (38.1 C) or higher. SEEK IMMEDIATE MEDICAL CARE IF:   You cough up bloody sputum that had not been present before.  You develop fever of 102 F (38.9 C) or greater.  You develop worsening pain at or near the incision site. MAKE SURE YOU:   Understand these instructions.  Will watch your condition.  Will get help right away if you are not doing well or get worse. Document Released: 08/29/2006 Document Revised: 07/11/2011 Document  Reviewed: 10/30/2006 [facility] Patient Information 2014 [place]Care, [place]LL[place].   ________________________________________________________________________

## 2013-09-16 NOTE — Progress Notes (Signed)
Chest x-ray 12/24/12 on EPIC, EKG 04/04/13 on EPIC

## 2013-09-17 ENCOUNTER — Encounter (HOSPITAL_COMMUNITY): Payer: Self-pay

## 2013-09-17 ENCOUNTER — Encounter (HOSPITAL_COMMUNITY)
Admission: RE | Admit: 2013-09-17 | Discharge: 2013-09-17 | Disposition: A | Discharge status: Home / Self Care | Payer: BC Managed Care – PPO | Source: Ambulatory Visit | Attending: Orthopedic Surgery | Admitting: Orthopedic Surgery

## 2013-09-17 ENCOUNTER — Ambulatory Visit (HOSPITAL_COMMUNITY)
Admission: RE | Admit: 2013-09-17 | Discharge: 2013-09-17 | Disposition: A | Discharge status: Home / Self Care | Payer: BC Managed Care – PPO | Source: Ambulatory Visit | Attending: Orthopedic Surgery | Admitting: Orthopedic Surgery

## 2013-09-17 DIAGNOSIS — Z01818 Encounter for other preprocedural examination: Secondary | ICD-10-CM | POA: Insufficient documentation

## 2013-09-17 DIAGNOSIS — M25859 Other specified joint disorders, unspecified hip: Secondary | ICD-10-CM | POA: Insufficient documentation

## 2013-09-17 DIAGNOSIS — M810 Age-related osteoporosis without current pathological fracture: Secondary | ICD-10-CM | POA: Insufficient documentation

## 2013-09-17 DIAGNOSIS — Z01812 Encounter for preprocedural laboratory examination: Secondary | ICD-10-CM | POA: Insufficient documentation

## 2013-09-17 HISTORY — DX: Personal history of urinary calculi: Z87.442

## 2013-09-17 HISTORY — DX: Pneumonia, unspecified organism: J18.9

## 2013-09-17 LAB — URINALYSIS, ROUTINE W REFLEX MICROSCOPIC
Bilirubin Urine: NEGATIVE
GLUCOSE, UA: NEGATIVE mg/dL
Ketones, ur: NEGATIVE mg/dL
Leukocytes, UA: NEGATIVE
Nitrite: NEGATIVE
PROTEIN: NEGATIVE mg/dL
Specific Gravity, Urine: 1.021 (ref 1.005–1.030)
Urobilinogen, UA: 1 mg/dL (ref 0.0–1.0)
pH: 6 (ref 5.0–8.0)

## 2013-09-17 LAB — URINE MICROSCOPIC-ADD ON

## 2013-09-17 LAB — COMPREHENSIVE METABOLIC PANEL
ALK PHOS: 46 U/L (ref 39–117)
ALT: 20 U/L (ref 0–53)
AST: 20 U/L (ref 0–37)
Albumin: 4 g/dL (ref 3.5–5.2)
BUN: 18 mg/dL (ref 6–23)
CO2: 25 meq/L (ref 19–32)
Calcium: 10.6 mg/dL — ABNORMAL HIGH (ref 8.4–10.5)
Chloride: 101 mEq/L (ref 96–112)
Creatinine, Ser: 1.06 mg/dL (ref 0.50–1.35)
GFR, EST AFRICAN AMERICAN: 87 mL/min — AB (ref >90)
GFR, EST NON AFRICAN AMERICAN: 75 mL/min — AB (ref >90)
GLUCOSE: 103 mg/dL — AB (ref 70–99)
Potassium: 3.9 mEq/L (ref 3.7–5.3)
Sodium: 139 mEq/L (ref 137–147)
TOTAL PROTEIN: 6.9 g/dL (ref 6.0–8.3)
Total Bilirubin: 0.5 mg/dL (ref 0.3–1.2)

## 2013-09-17 LAB — PROTIME-INR
INR: 0.94 (ref 0.00–1.49)
PROTHROMBIN TIME: 12.4 s (ref 11.6–15.2)

## 2013-09-17 LAB — CBC
HEMATOCRIT: 40.7 % (ref 39.0–52.0)
HEMOGLOBIN: 14.2 g/dL (ref 13.0–17.0)
MCH: 28.7 pg (ref 26.0–34.0)
MCHC: 34.9 g/dL (ref 30.0–36.0)
MCV: 82.2 fL (ref 78.0–100.0)
Platelets: 262 10*3/uL (ref 150–400)
RBC: 4.95 MIL/uL (ref 4.22–5.81)
RDW: 12.6 % (ref 11.5–15.5)
WBC: 3.6 10*3/uL — ABNORMAL LOW (ref 4.0–10.5)

## 2013-09-17 LAB — APTT: aPTT: 32 seconds (ref 24–37)

## 2013-09-17 LAB — SURGICAL PCR SCREEN
MRSA, PCR: NEGATIVE
Staphylococcus aureus: NEGATIVE

## 2013-09-18 NOTE — Progress Notes (Signed)
Fax received from Dr. Deri FuellingAluisio's office. Note to "repeat cath UA in hospital". Fax placed on chart and order made to collect urine.

## 2013-09-24 ENCOUNTER — Encounter (HOSPITAL_COMMUNITY): Payer: BC Managed Care – PPO | Admitting: Certified Registered Nurse Anesthetist

## 2013-09-24 ENCOUNTER — Encounter (HOSPITAL_COMMUNITY)
Admission: RE | Disposition: A | Discharge status: Home Health | Payer: Self-pay | Source: Ambulatory Visit | Attending: Orthopedic Surgery

## 2013-09-24 ENCOUNTER — Inpatient Hospital Stay (HOSPITAL_COMMUNITY): Payer: BC Managed Care – PPO | Admitting: Certified Registered Nurse Anesthetist

## 2013-09-24 ENCOUNTER — Encounter (HOSPITAL_COMMUNITY): Payer: Self-pay | Admitting: *Deleted

## 2013-09-24 ENCOUNTER — Inpatient Hospital Stay (HOSPITAL_COMMUNITY): Payer: BC Managed Care – PPO

## 2013-09-24 ENCOUNTER — Inpatient Hospital Stay (HOSPITAL_COMMUNITY)
Admission: RE | Admit: 2013-09-24 | Discharge: 2013-09-25 | DRG: 468 | Disposition: A | Discharge status: Home Health | Payer: BC Managed Care – PPO | Source: Ambulatory Visit | Attending: Orthopedic Surgery | Admitting: Orthopedic Surgery

## 2013-09-24 DIAGNOSIS — Y831 Surgical operation with implant of artificial internal device as the cause of abnormal reaction of the patient, or of later complication, without mention of misadventure at the time of the procedure: Secondary | ICD-10-CM | POA: Diagnosis present

## 2013-09-24 DIAGNOSIS — M1610 Unilateral primary osteoarthritis, unspecified hip: Secondary | ICD-10-CM | POA: Diagnosis present

## 2013-09-24 DIAGNOSIS — Z87891 Personal history of nicotine dependence: Secondary | ICD-10-CM

## 2013-09-24 DIAGNOSIS — I10 Essential (primary) hypertension: Secondary | ICD-10-CM | POA: Diagnosis present

## 2013-09-24 DIAGNOSIS — M25559 Pain in unspecified hip: Secondary | ICD-10-CM | POA: Diagnosis present

## 2013-09-24 DIAGNOSIS — M169 Osteoarthritis of hip, unspecified: Secondary | ICD-10-CM | POA: Diagnosis present

## 2013-09-24 DIAGNOSIS — Z6830 Body mass index (BMI) 30.0-30.9, adult: Secondary | ICD-10-CM

## 2013-09-24 DIAGNOSIS — Z87442 Personal history of urinary calculi: Secondary | ICD-10-CM

## 2013-09-24 DIAGNOSIS — I4891 Unspecified atrial fibrillation: Secondary | ICD-10-CM | POA: Diagnosis present

## 2013-09-24 DIAGNOSIS — T84069A Wear of articular bearing surface of unspecified internal prosthetic joint, initial encounter: Principal | ICD-10-CM | POA: Diagnosis present

## 2013-09-24 DIAGNOSIS — T8489XA Other specified complication of internal orthopedic prosthetic devices, implants and grafts, initial encounter: Secondary | ICD-10-CM | POA: Diagnosis present

## 2013-09-24 DIAGNOSIS — Z96649 Presence of unspecified artificial hip joint: Secondary | ICD-10-CM

## 2013-09-24 DIAGNOSIS — G4733 Obstructive sleep apnea (adult) (pediatric): Secondary | ICD-10-CM | POA: Diagnosis present

## 2013-09-24 HISTORY — PX: INCISION AND DRAINAGE HIP: SHX1801

## 2013-09-24 LAB — URINALYSIS, ROUTINE W REFLEX MICROSCOPIC
Bilirubin Urine: NEGATIVE
Glucose, UA: NEGATIVE mg/dL
Ketones, ur: NEGATIVE mg/dL
NITRITE: NEGATIVE
PH: 6 (ref 5.0–8.0)
Protein, ur: NEGATIVE mg/dL
SPECIFIC GRAVITY, URINE: 1.02 (ref 1.005–1.030)
UROBILINOGEN UA: 0.2 mg/dL (ref 0.0–1.0)

## 2013-09-24 LAB — URINE MICROSCOPIC-ADD ON

## 2013-09-24 LAB — TYPE AND SCREEN
ABO/RH(D): O POS
ANTIBODY SCREEN: NEGATIVE

## 2013-09-24 LAB — ABO/RH: ABO/RH(D): O POS

## 2013-09-24 SURGERY — IRRIGATION AND DEBRIDEMENT HIP WITH POLY EXCHANGE
Anesthesia: General | Site: Hip | Laterality: Right

## 2013-09-24 MED ORDER — ACETAMINOPHEN 325 MG PO TABS: 650.0000 mg | ORAL_TABLET | Freq: Four times a day (QID) | ORAL | Status: DC | PRN

## 2013-09-24 MED ORDER — CEFAZOLIN SODIUM-DEXTROSE 2-3 GM-% IV SOLR
2.0000 g | Freq: Four times a day (QID) | INTRAVENOUS | Status: AC
  Administered 2013-09-24 – 2013-09-25 (×2): 2 g via INTRAVENOUS
  Filled 2013-09-24 (×2): qty 50

## 2013-09-24 MED ORDER — BUPIVACAINE LIPOSOME 1.3 % IJ SUSP
20.0000 mL | Freq: Once | INTRAMUSCULAR | Status: DC
  Filled 2013-09-24: qty 20

## 2013-09-24 MED ORDER — PROPOFOL 10 MG/ML IV BOLUS
INTRAVENOUS | Status: DC | PRN
  Administered 2013-09-24: 200 mg via INTRAVENOUS

## 2013-09-24 MED ORDER — METHOCARBAMOL 500 MG PO TABS
500.0000 mg | ORAL_TABLET | Freq: Four times a day (QID) | ORAL | Status: DC | PRN
  Filled 2013-09-24: qty 1

## 2013-09-24 MED ORDER — DEXAMETHASONE SODIUM PHOSPHATE 10 MG/ML IJ SOLN
INTRAMUSCULAR | Status: DC | PRN
  Administered 2013-09-24: 10 mg via INTRAVENOUS

## 2013-09-24 MED ORDER — FLEET ENEMA 7-19 GM/118ML RE ENEM: 1.0000 | ENEMA | Freq: Once | RECTAL | Status: AC | PRN

## 2013-09-24 MED ORDER — ONDANSETRON HCL 4 MG/2ML IJ SOLN
INTRAMUSCULAR | Status: AC
  Filled 2013-09-24: qty 2

## 2013-09-24 MED ORDER — MIDAZOLAM HCL 5 MG/5ML IJ SOLN
INTRAMUSCULAR | Status: DC | PRN
  Administered 2013-09-24 (×2): 1 mg via INTRAVENOUS

## 2013-09-24 MED ORDER — CEFAZOLIN SODIUM-DEXTROSE 2-3 GM-% IV SOLR
INTRAVENOUS | Status: AC
  Filled 2013-09-24: qty 50

## 2013-09-24 MED ORDER — METOCLOPRAMIDE HCL 10 MG PO TABS: 5.0000 mg | ORAL_TABLET | Freq: Three times a day (TID) | ORAL | Status: DC | PRN

## 2013-09-24 MED ORDER — DEXAMETHASONE 6 MG PO TABS
10.0000 mg | ORAL_TABLET | Freq: Every day | ORAL | Status: AC
  Administered 2013-09-25: 10 mg via ORAL
  Filled 2013-09-24: qty 1

## 2013-09-24 MED ORDER — DOCUSATE SODIUM 100 MG PO CAPS
100.0000 mg | ORAL_CAPSULE | Freq: Two times a day (BID) | ORAL | Status: DC
  Administered 2013-09-24 – 2013-09-25 (×2): 100 mg via ORAL

## 2013-09-24 MED ORDER — OXYCODONE HCL 5 MG/5ML PO SOLN
5.0000 mg | Freq: Once | ORAL | Status: DC | PRN
  Filled 2013-09-24: qty 5

## 2013-09-24 MED ORDER — CEFAZOLIN SODIUM-DEXTROSE 2-3 GM-% IV SOLR
2.0000 g | INTRAVENOUS | Status: AC
  Administered 2013-09-24: 2 g via INTRAVENOUS

## 2013-09-24 MED ORDER — ASPIRIN EC 325 MG PO TBEC
325.0000 mg | DELAYED_RELEASE_TABLET | Freq: Every day | ORAL | Status: DC
  Administered 2013-09-25: 325 mg via ORAL
  Filled 2013-09-24 (×2): qty 1

## 2013-09-24 MED ORDER — ACETAMINOPHEN 500 MG PO TABS
1000.0000 mg | ORAL_TABLET | Freq: Four times a day (QID) | ORAL | Status: DC
  Administered 2013-09-24 – 2013-09-25 (×3): 1000 mg via ORAL
  Filled 2013-09-24 (×4): qty 2

## 2013-09-24 MED ORDER — ONDANSETRON HCL 4 MG PO TABS: 4.0000 mg | ORAL_TABLET | Freq: Four times a day (QID) | ORAL | Status: DC | PRN

## 2013-09-24 MED ORDER — PHENOL 1.4 % MT LIQD: 1.0000 | OROMUCOSAL | Status: DC | PRN

## 2013-09-24 MED ORDER — AMLODIPINE BESYLATE 10 MG PO TABS
10.0000 mg | ORAL_TABLET | Freq: Every day | ORAL | Status: DC
  Administered 2013-09-25: 10 mg via ORAL
  Filled 2013-09-24 (×2): qty 1

## 2013-09-24 MED ORDER — NEOSTIGMINE METHYLSULFATE 10 MG/10ML IV SOLN
INTRAVENOUS | Status: DC | PRN
  Administered 2013-09-24: 4 mg via INTRAVENOUS

## 2013-09-24 MED ORDER — HYDROMORPHONE HCL PF 1 MG/ML IJ SOLN
0.2500 mg | INTRAMUSCULAR | Status: DC | PRN
Start: 2013-09-24 — End: 2013-09-24

## 2013-09-24 MED ORDER — SODIUM CHLORIDE 0.9 % IV SOLN
INTRAVENOUS | Status: DC
Start: 2013-09-24 — End: 2013-09-24

## 2013-09-24 MED ORDER — BUPIVACAINE-EPINEPHRINE (PF) 0.25% -1:200000 IJ SOLN
INTRAMUSCULAR | Status: AC
  Filled 2013-09-24: qty 30

## 2013-09-24 MED ORDER — NEOSTIGMINE METHYLSULFATE 10 MG/10ML IV SOLN
INTRAVENOUS | Status: AC
  Filled 2013-09-24: qty 1

## 2013-09-24 MED ORDER — ACETAMINOPHEN 10 MG/ML IV SOLN
1000.0000 mg | Freq: Once | INTRAVENOUS | Status: AC
  Administered 2013-09-24: 1000 mg via INTRAVENOUS
  Filled 2013-09-24 (×2): qty 100

## 2013-09-24 MED ORDER — METOCLOPRAMIDE HCL 5 MG/ML IJ SOLN: 5.0000 mg | Freq: Three times a day (TID) | INTRAMUSCULAR | Status: DC | PRN

## 2013-09-24 MED ORDER — FENTANYL CITRATE 0.05 MG/ML IJ SOLN
INTRAMUSCULAR | Status: DC | PRN
  Administered 2013-09-24 (×5): 50 ug via INTRAVENOUS

## 2013-09-24 MED ORDER — POTASSIUM CHLORIDE IN NACL 20-0.9 MEQ/L-% IV SOLN
INTRAVENOUS | Status: DC
  Filled 2013-09-24 (×4): qty 1000

## 2013-09-24 MED ORDER — POLYETHYLENE GLYCOL 3350 17 G PO PACK: 17.0000 g | PACK | Freq: Every day | ORAL | Status: DC | PRN

## 2013-09-24 MED ORDER — DEXAMETHASONE SODIUM PHOSPHATE 10 MG/ML IJ SOLN: 10.0000 mg | Freq: Once | INTRAMUSCULAR | Status: DC

## 2013-09-24 MED ORDER — GLYCOPYRROLATE 0.2 MG/ML IJ SOLN
INTRAMUSCULAR | Status: DC | PRN
Start: 2013-09-24 — End: 2013-09-24
  Administered 2013-09-24: 0.6 mg via INTRAVENOUS

## 2013-09-24 MED ORDER — BUPIVACAINE HCL 0.25 % IJ SOLN
INTRAMUSCULAR | Status: DC | PRN
  Administered 2013-09-24: 30 mL

## 2013-09-24 MED ORDER — 0.9 % SODIUM CHLORIDE (POUR BTL) OPTIME
TOPICAL | Status: DC | PRN
  Administered 2013-09-24: 1000 mL

## 2013-09-24 MED ORDER — ONDANSETRON HCL 4 MG/2ML IJ SOLN
INTRAMUSCULAR | Status: DC | PRN
  Administered 2013-09-24: 4 mg via INTRAVENOUS

## 2013-09-24 MED ORDER — HYDROCHLOROTHIAZIDE 25 MG PO TABS
25.0000 mg | ORAL_TABLET | Freq: Every day | ORAL | Status: DC
  Administered 2013-09-25: 25 mg via ORAL
  Filled 2013-09-24 (×2): qty 1

## 2013-09-24 MED ORDER — DEXAMETHASONE SODIUM PHOSPHATE 10 MG/ML IJ SOLN
10.0000 mg | Freq: Every day | INTRAMUSCULAR | Status: AC
  Filled 2013-09-24: qty 1

## 2013-09-24 MED ORDER — ROCURONIUM BROMIDE 100 MG/10ML IV SOLN
INTRAVENOUS | Status: DC | PRN
  Administered 2013-09-24: 40 mg via INTRAVENOUS

## 2013-09-24 MED ORDER — BUPIVACAINE LIPOSOME 1.3 % IJ SUSP
INTRAMUSCULAR | Status: DC | PRN
  Administered 2013-09-24: 20 mL

## 2013-09-24 MED ORDER — PHENYLEPHRINE HCL 10 MG/ML IJ SOLN
INTRAMUSCULAR | Status: DC | PRN
  Administered 2013-09-24 (×2): 80 ug via INTRAVENOUS
  Administered 2013-09-24: 40 ug via INTRAVENOUS

## 2013-09-24 MED ORDER — HYDROMORPHONE HCL PF 2 MG/ML IJ SOLN
INTRAMUSCULAR | Status: AC
  Filled 2013-09-24: qty 1

## 2013-09-24 MED ORDER — OXYCODONE HCL 5 MG PO TABS
5.0000 mg | ORAL_TABLET | ORAL | Status: DC | PRN
  Administered 2013-09-24 – 2013-09-25 (×3): 5 mg via ORAL
  Administered 2013-09-25: 10 mg via ORAL
  Filled 2013-09-24: qty 1
  Filled 2013-09-24: qty 2
  Filled 2013-09-24: qty 1
  Filled 2013-09-24: qty 2

## 2013-09-24 MED ORDER — HYDROMORPHONE HCL PF 1 MG/ML IJ SOLN
0.2500 mg | INTRAMUSCULAR | Status: DC | PRN
  Administered 2013-09-24: 0.5 mg via INTRAVENOUS

## 2013-09-24 MED ORDER — LIDOCAINE HCL (CARDIAC) 20 MG/ML IV SOLN
INTRAVENOUS | Status: DC | PRN
  Administered 2013-09-24: 100 mg via INTRAVENOUS

## 2013-09-24 MED ORDER — SODIUM CHLORIDE 0.9 % IJ SOLN
INTRAMUSCULAR | Status: AC
  Filled 2013-09-24: qty 50

## 2013-09-24 MED ORDER — PROMETHAZINE HCL 25 MG/ML IJ SOLN: 6.2500 mg | INTRAMUSCULAR | Status: DC | PRN

## 2013-09-24 MED ORDER — LACTATED RINGERS IV SOLN
INTRAVENOUS | Status: DC
  Administered 2013-09-24: 1000 mL via INTRAVENOUS
  Administered 2013-09-24 (×2): via INTRAVENOUS

## 2013-09-24 MED ORDER — EPHEDRINE SULFATE 50 MG/ML IJ SOLN
INTRAMUSCULAR | Status: DC | PRN
  Administered 2013-09-24 (×4): 5 mg via INTRAVENOUS

## 2013-09-24 MED ORDER — TRANEXAMIC ACID 100 MG/ML IV SOLN
1000.0000 mg | INTRAVENOUS | Status: AC
  Administered 2013-09-24: 1000 mg via INTRAVENOUS
  Filled 2013-09-24: qty 10

## 2013-09-24 MED ORDER — SUCCINYLCHOLINE CHLORIDE 20 MG/ML IJ SOLN
INTRAMUSCULAR | Status: DC | PRN
  Administered 2013-09-24: 100 mg via INTRAVENOUS

## 2013-09-24 MED ORDER — LIDOCAINE HCL (CARDIAC) 20 MG/ML IV SOLN
INTRAVENOUS | Status: AC
  Filled 2013-09-24: qty 5

## 2013-09-24 MED ORDER — HYDROMORPHONE HCL PF 1 MG/ML IJ SOLN
INTRAMUSCULAR | Status: AC
  Filled 2013-09-24: qty 1

## 2013-09-24 MED ORDER — MIDAZOLAM HCL 2 MG/2ML IJ SOLN
INTRAMUSCULAR | Status: AC
  Filled 2013-09-24: qty 2

## 2013-09-24 MED ORDER — PROPOFOL 10 MG/ML IV BOLUS
INTRAVENOUS | Status: AC
  Filled 2013-09-24: qty 20

## 2013-09-24 MED ORDER — OXYCODONE HCL 5 MG PO TABS: 5.0000 mg | ORAL_TABLET | Freq: Once | ORAL | Status: DC | PRN

## 2013-09-24 MED ORDER — FENTANYL CITRATE 0.05 MG/ML IJ SOLN
INTRAMUSCULAR | Status: AC
  Filled 2013-09-24: qty 5

## 2013-09-24 MED ORDER — MEPERIDINE HCL 50 MG/ML IJ SOLN: 6.2500 mg | INTRAMUSCULAR | Status: DC | PRN

## 2013-09-24 MED ORDER — BUPIVACAINE HCL (PF) 0.25 % IJ SOLN
INTRAMUSCULAR | Status: AC
  Filled 2013-09-24: qty 30

## 2013-09-24 MED ORDER — ACETAMINOPHEN 650 MG RE SUPP: 650.0000 mg | Freq: Four times a day (QID) | RECTAL | Status: DC | PRN

## 2013-09-24 MED ORDER — METHOCARBAMOL 1000 MG/10ML IJ SOLN
500.0000 mg | Freq: Four times a day (QID) | INTRAVENOUS | Status: DC | PRN
  Administered 2013-09-24: 500 mg via INTRAVENOUS
  Filled 2013-09-24: qty 5

## 2013-09-24 MED ORDER — DEXAMETHASONE SODIUM PHOSPHATE 10 MG/ML IJ SOLN
INTRAMUSCULAR | Status: AC
  Filled 2013-09-24: qty 1

## 2013-09-24 MED ORDER — ONDANSETRON HCL 4 MG/2ML IJ SOLN: 4.0000 mg | Freq: Four times a day (QID) | INTRAMUSCULAR | Status: DC | PRN

## 2013-09-24 MED ORDER — PHENYLEPHRINE 40 MCG/ML (10ML) SYRINGE FOR IV PUSH (FOR BLOOD PRESSURE SUPPORT)
PREFILLED_SYRINGE | INTRAVENOUS | Status: AC
  Filled 2013-09-24: qty 10

## 2013-09-24 MED ORDER — IRBESARTAN 300 MG PO TABS
300.0000 mg | ORAL_TABLET | Freq: Every day | ORAL | Status: DC
  Administered 2013-09-25: 300 mg via ORAL
  Filled 2013-09-24 (×2): qty 1

## 2013-09-24 MED ORDER — MENTHOL 3 MG MT LOZG: 1.0000 | LOZENGE | OROMUCOSAL | Status: DC | PRN

## 2013-09-24 MED ORDER — BISACODYL 10 MG RE SUPP: 10.0000 mg | Freq: Every day | RECTAL | Status: DC | PRN

## 2013-09-24 MED ORDER — VALSARTAN-HYDROCHLOROTHIAZIDE 320-25 MG PO TABS: 1.0000 | ORAL_TABLET | Freq: Every morning | ORAL | Status: DC

## 2013-09-24 MED ORDER — MORPHINE SULFATE 2 MG/ML IJ SOLN: 1.0000 mg | INTRAMUSCULAR | Status: DC | PRN

## 2013-09-24 MED ORDER — GLYCOPYRROLATE 0.2 MG/ML IJ SOLN
INTRAMUSCULAR | Status: AC
  Filled 2013-09-24: qty 3

## 2013-09-24 MED ORDER — TRAMADOL HCL 50 MG PO TABS: 50.0000 mg | ORAL_TABLET | Freq: Four times a day (QID) | ORAL | Status: DC | PRN

## 2013-09-24 MED ORDER — DIPHENHYDRAMINE HCL 12.5 MG/5ML PO ELIX: 12.5000 mg | ORAL_SOLUTION | ORAL | Status: DC | PRN

## 2013-09-24 SURGICAL SUPPLY — 51 items
BAG ZIPLOCK 12X15 (MISCELLANEOUS) ×2 IMPLANT
BIT DRILL 2.4X128 (BIT) ×2 IMPLANT
BLADE EXTENDED COATED 6.5IN (ELECTRODE) ×2 IMPLANT
DRAPE INCISE IOBAN 66X45 STRL (DRAPES) ×2 IMPLANT
DRAPE ORTHO SPLIT 77X108 STRL (DRAPES) ×2
DRAPE POUCH INSTRU U-SHP 10X18 (DRAPES) ×2 IMPLANT
DRAPE SURG ORHT 6 SPLT 77X108 (DRAPES) ×2 IMPLANT
DRAPE U-SHAPE 47X51 STRL (DRAPES) ×2 IMPLANT
DRSG EMULSION OIL 3X16 NADH (GAUZE/BANDAGES/DRESSINGS) ×2 IMPLANT
DRSG MEPILEX BORDER 4X4 (GAUZE/BANDAGES/DRESSINGS) ×2 IMPLANT
DRSG MEPILEX BORDER 4X8 (GAUZE/BANDAGES/DRESSINGS) ×2 IMPLANT
DURAPREP 26ML APPLICATOR (WOUND CARE) ×2 IMPLANT
ELECT REM PT RETURN 9FT ADLT (ELECTROSURGICAL) ×2
ELECTRODE REM PT RTRN 9FT ADLT (ELECTROSURGICAL) ×1 IMPLANT
EVACUATOR 1/8 PVC DRAIN (DRAIN) ×2 IMPLANT
FACESHIELD WRAPAROUND (MASK) ×8 IMPLANT
GLOVE BIO SURGEON STRL SZ7.5 (GLOVE) ×2 IMPLANT
GLOVE BIO SURGEON STRL SZ8 (GLOVE) ×2 IMPLANT
GLOVE BIOGEL PI IND STRL 8 (GLOVE) ×1 IMPLANT
GLOVE BIOGEL PI INDICATOR 8 (GLOVE) ×1
GOWN STRL REUS W/TWL LRG LVL3 (GOWN DISPOSABLE) ×2 IMPLANT
GOWN STRL REUS W/TWL XL LVL3 (GOWN DISPOSABLE) ×2 IMPLANT
HEAD FEM DLT TS CER 36X+0 (Hips) ×2 IMPLANT
IMMOBILIZER KNEE 20 (SOFTGOODS) ×2 IMPLANT
IMMOBILIZER KNEE 20 THIGH 36 (SOFTGOODS) ×1 IMPLANT
KIT BASIN OR (CUSTOM PROCEDURE TRAY) ×2 IMPLANT
LINER MARATHON NEUT +4X54X36 (Hips) ×4 IMPLANT
MANIFOLD NEPTUNE II (INSTRUMENTS) ×2 IMPLANT
NDL SAFETY ECLIPSE 18X1.5 (NEEDLE) ×2 IMPLANT
NEEDLE HYPO 18GX1.5 SHARP (NEEDLE) ×2
NS IRRIG 1000ML POUR BTL (IV SOLUTION) ×4 IMPLANT
PACK TOTAL JOINT (CUSTOM PROCEDURE TRAY) ×2 IMPLANT
PASSER SUT SWANSON 36MM LOOP (INSTRUMENTS) ×2 IMPLANT
POSITIONER SURGICAL ARM (MISCELLANEOUS) ×2 IMPLANT
SPONGE GAUZE 4X4 12PLY (GAUZE/BANDAGES/DRESSINGS) ×2 IMPLANT
STRIP CLOSURE SKIN 1/2X4 (GAUZE/BANDAGES/DRESSINGS) ×2 IMPLANT
SUCTION FRAZIER TIP 10 FR DISP (SUCTIONS) ×2 IMPLANT
SUT ETHIBOND NAB CT1 #1 30IN (SUTURE) ×4 IMPLANT
SUT MNCRL AB 4-0 PS2 18 (SUTURE) ×2 IMPLANT
SUT VIC AB 1 CT1 27 (SUTURE) ×3
SUT VIC AB 1 CT1 27XBRD ANTBC (SUTURE) ×3 IMPLANT
SUT VIC AB 2-0 CT1 27 (SUTURE) ×3
SUT VIC AB 2-0 CT1 TAPERPNT 27 (SUTURE) ×3 IMPLANT
SUT VLOC 180 0 24IN GS25 (SUTURE) ×2 IMPLANT
SWAB COLLECTION DEVICE MRSA (MISCELLANEOUS) ×2 IMPLANT
SYR 20CC LL (SYRINGE) ×2 IMPLANT
SYR 50ML LL SCALE MARK (SYRINGE) ×2 IMPLANT
TOWEL OR 17X26 10 PK STRL BLUE (TOWEL DISPOSABLE) ×4 IMPLANT
TRAY FOLEY CATH 14FRSI W/METER (CATHETERS) ×2 IMPLANT
TUBE ANAEROBIC SPECIMEN COL (MISCELLANEOUS) ×2 IMPLANT
WATER STERILE IRR 1500ML POUR (IV SOLUTION) ×2 IMPLANT

## 2013-09-24 NOTE — Anesthesia Postprocedure Evaluation (Signed)
Anesthesia Post Note  Patient: Luis Mccann  Procedure(s) Performed: Procedure(s) (LRB): RIGHT HIP BEARING SURFACE REVISION (Right)  Anesthesia type: General  Patient location: PACU  Post pain: Pain level controlled  Post assessment: Post-op Vital signs reviewed  Last Vitals: BP 145/85  Pulse 65  Temp(Src) 36.6 C (Oral)  Resp 16  Ht 5\' 9"  (1.753 m)  Wt 206 lb (93.441 kg)  BMI 30.41 kg/m2  SpO2 97%  Post vital signs: Reviewed  Level of consciousness: sedated  Complications: No apparent anesthesia complications

## 2013-09-24 NOTE — Anesthesia Preprocedure Evaluation (Addendum)
Anesthesia Evaluation  Patient identified by MRN, date of birth, ID band Patient awake    Reviewed: Allergy & Precautions, H&P , NPO status , Patient's Chart, lab work & pertinent test results  Airway Mallampati: II TM Distance: >3 FB Neck ROM: Full    Dental no notable dental hx.    Pulmonary sleep apnea , pneumonia -, resolved, former smoker,  breath sounds clear to auscultation  Pulmonary exam normal       Cardiovascular hypertension, Pt. on medications + dysrhythmias Atrial Fibrillation Rhythm:Regular Rate:Normal     Neuro/Psych negative neurological ROS  negative psych ROS   GI/Hepatic negative GI ROS, Neg liver ROS,   Endo/Other  negative endocrine ROS  Renal/GU Renal diseaseH/O kidney stones     Musculoskeletal negative musculoskeletal ROS (+)   Abdominal (+) + obese,   Peds  Hematology negative hematology ROS (+)   Anesthesia Other Findings   Reproductive/Obstetrics negative OB ROS                          Anesthesia Physical Anesthesia Plan  ASA: III  Anesthesia Plan:    Post-op Pain Management:    Induction: Intravenous  Airway Management Planned:   Additional Equipment:   Intra-op Plan:   Post-operative Plan:   Informed Consent: I have reviewed the patients History and Physical, chart, labs and discussed the procedure including the risks, benefits and alternatives for the proposed anesthesia with the patient or authorized representative who has indicated his/her understanding and acceptance.   Dental advisory given  Plan Discussed with: CRNA  Anesthesia Plan Comments:        Anesthesia Quick Evaluation

## 2013-09-24 NOTE — Brief Op Note (Signed)
09/24/2013  4:31 PM  PATIENT:  Luis Mccann  58 y.o. male  PRE-OPERATIVE DIAGNOSIS:  PAINFUL RIGHT TOTAL HIP ARTHROPLASTY   POST-OPERATIVE DIAGNOSIS:  PAINFUL RIGHT TOTAL HIP ARTHROPLASTY   PROCEDURE:  RIGHT HIP BEARING SURFACE REVISION  SURGEON:  Surgeon(s) and Role:    * Loanne Drilling, MD - Primary  PHYSICIAN ASSISTANT:   ASSISTANTS: Avel Peace, PA-C   ANESTHESIA:   general  EBL:  Total I/O In: 2000 [I.V.:2000] Out: 400 [Urine:250; Blood:150]  BLOOD ADMINISTERED:none  DRAINS: (Medium) Hemovact drain(s) in the right hip with  Suction Open   LOCAL MEDICATIONS USED:  OTHER Exparel  COUNTS:  YES  TOURNIQUET:  * No tourniquets in log *  DICTATION: .Other Dictation: Dictation Number [id number]  PLAN OF CARE: Admit to inpatient   PATIENT DISPOSITION:  PACU - hemodynamically stable.

## 2013-09-24 NOTE — Plan of Care (Signed)
Problem: Consults Goal: Diagnosis- Total Joint Replacement Revision Total Hip     

## 2013-09-24 NOTE — Interval H&P Note (Signed)
History and Physical Interval Note:  09/24/2013 2:18 PM  Luis Mccann  has presented today for surgery, with the diagnosis of PAINFUL RIGHT TOTAL HIP ARTHROPLASTY   The various methods of treatment have been discussed with the patient and family. After consideration of risks, benefits and other options for treatment, the patient has consented to  Procedure(s): RIGHT HIP BEARING SURFACE EXCHANGE (Right) as a surgical intervention .  The patient's history has been reviewed, patient examined, no change in status, stable for surgery.  I have reviewed the patient's chart and labs.  Questions were answered to the patient's satisfaction.     Gus Rankin Dorianne Perret

## 2013-09-24 NOTE — Transfer of Care (Signed)
Immediate Anesthesia Transfer of Care Note  Patient: Luis Mccann  Procedure(s) Performed: Procedure(s) (LRB): RIGHT HIP BEARING SURFACE REVISION (Right)  Patient Location: PACU  Anesthesia Type: General  Level of Consciousness: sedated, patient cooperative and responds to stimulation  Airway & Oxygen Therapy: Patient Spontanous Breathing and Patient connected to face mask oxgen  Post-op Assessment: Report given to PACU RN and Post -op Vital signs reviewed and stable  Post vital signs: Reviewed and stable  Complications: No apparent anesthesia complications

## 2013-09-25 ENCOUNTER — Encounter (HOSPITAL_COMMUNITY): Payer: Self-pay | Admitting: Orthopedic Surgery

## 2013-09-25 LAB — BASIC METABOLIC PANEL
BUN: 17 mg/dL (ref 6–23)
CHLORIDE: 100 meq/L (ref 96–112)
CO2: 29 meq/L (ref 19–32)
CREATININE: 1.04 mg/dL (ref 0.50–1.35)
Calcium: 10.4 mg/dL (ref 8.4–10.5)
GFR calc Af Amer: 89 mL/min — ABNORMAL LOW (ref >90)
GFR calc non Af Amer: 77 mL/min — ABNORMAL LOW (ref >90)
Glucose, Bld: 135 mg/dL — ABNORMAL HIGH (ref 70–99)
Potassium: 4.5 mEq/L (ref 3.7–5.3)
Sodium: 139 mEq/L (ref 137–147)

## 2013-09-25 LAB — CBC
HCT: 37.2 % — ABNORMAL LOW (ref 39.0–52.0)
Hemoglobin: 12.9 g/dL — ABNORMAL LOW (ref 13.0–17.0)
MCH: 28.4 pg (ref 26.0–34.0)
MCHC: 34.7 g/dL (ref 30.0–36.0)
MCV: 81.9 fL (ref 78.0–100.0)
Platelets: 211 10*3/uL (ref 150–400)
RBC: 4.54 MIL/uL (ref 4.22–5.81)
RDW: 12.4 % (ref 11.5–15.5)
WBC: 9.2 10*3/uL (ref 4.0–10.5)

## 2013-09-25 MED ORDER — METHOCARBAMOL 500 MG PO TABS: 500.0000 mg | ORAL_TABLET | Freq: Four times a day (QID) | ORAL | Status: DC | PRN

## 2013-09-25 MED ORDER — OXYCODONE HCL 5 MG PO TABS: 5.0000 mg | ORAL_TABLET | ORAL | Status: DC | PRN

## 2013-09-25 MED ORDER — ASPIRIN 325 MG PO TBEC: 325.0000 mg | DELAYED_RELEASE_TABLET | Freq: Every day | ORAL | Status: DC

## 2013-09-25 MED ORDER — TRAMADOL HCL 50 MG PO TABS: 50.0000 mg | ORAL_TABLET | Freq: Four times a day (QID) | ORAL | Status: DC | PRN

## 2013-09-25 NOTE — Discharge Instructions (Signed)
Pick up stool softner and laxative for home. Do not submerge incision under water. May shower starting on Friday. Continue to use ice for pain and swelling from surgery. Hip precautions.  Total Hip Protocol.  Aspirin 325 mg daily.

## 2013-09-25 NOTE — Progress Notes (Signed)
[facility] is providing the following services: RW  If patient discharges after hours, please call [phone].   Renard Hamper 09/25/2013, 1:01 PM

## 2013-09-25 NOTE — Plan of Care (Signed)
Problem: Consults Goal: Diagnosis- Total Joint Replacement Outcome: Completed/Met Date Met:  09/25/13 Revision Total Hip RIGHT

## 2013-09-25 NOTE — Op Note (Signed)
NAMSaunders Glance:  Luis Mccann, Luis Mccann                  ACCOUNT NO.:  [id number]  MEDICAL RECORD NO.:  [id number]  LOCATION:  1614                         [facility]  PHYSICIAN:  Ollen GrossFrank Fontaine Hehl, M.D.    DATE OF BIRTH:  06/22/1955  DATE OF PROCEDURE:  09/24/2013 DATE OF DISCHARGE:                              OPERATIVE REPORT   PREOPERATIVE DIAGNOSIS:  Painful right total hip arthroplasty.  POSTOPERATIVE DIAGNOSIS:  Painful right total hip arthroplasty.  PROCEDURE:  Right hip bearing surface, revision.  SURGEON:  Ollen GrossFrank Adolphe Fortunato, M.D.  ASSISTANT:  Alexzandrew L. Perkins, P.A.C.  ANESTHESIA:  General.  ESTIMATED BLOOD LOSS:  200 mL.  DRAINS:  Hemovac x1.  COMPLICATIONS:  None.  CONDITION:  Stable to recovery.  BRIEF CLINICAL NOTE:  Luis Mccann is a 58 year old male, had a right total hip arthroplasty done approximately 10 years ago.  He did very well functionally, but has had persistent intermittent groin discomfort with certain activities.  He has had an extensive workup and it is felt that this may be due to his bearing surface that he has metal-on-metal prosthesis.  I had a long discussion in regard to this and he opted for bearing surface revision.  He presents today for that procedure.  PROCEDURE IN DETAIL:  After successful administration of general anesthetic, the patient was placed in the left lateral decubitus position with the right side up and held with the hip positioner.  Right lower extremity was isolated from its perineum with plastic drapes and prepped and draped in the usual sterile fashion.  Short posterolateral incision was made with a 10 blade through subcutaneous tissue to the level of the fascia lata which was incised in line with the skin incision.  The sciatic nerve was palpated and protected.  The joint was entered by incising the posterior pseudocapsule off the femur.  There was some metal-stained fluid and tissue in the joint.  No evidence of any gross bearing  surface wear.  I debrided the bursa and tissue back to healthy-appearing tissue.  We then dislocated the hip.  I removed the femoral head which did not have any damage.  I then inspected the trunnion of the femoral neck and there was no evidence of any corrosion. There was a normal looking trunnion.  We then inspected the rest of the proximal femur, and the stem is well fixed.  We then retracted the femur anteriorly to gain acetabular exposure.  Acetabular retractors were placed.  We were able to remove the metal liner from the acetabular shell.  The acetabular shell was well fixed and in excellent position. We decided to keep the cup intact as that did not appear to be the problem.  We thoroughly irrigated the shell and then placed a 36 mm neutral +4 Marathon liner into the acetabular shell.  We tried to impact it at the first liner would not lock in place.  We got circumferentially clear the cup of tissue, but this liner would not impact.  I felt as though the liner may have a damage, so we opened the new liner which was also a 54 mm, a 36 neutral +4  and impacted that and it  fit perfectly and locked in place without difficulty.  Given that the trunnion was normal on the femoral neck, and that is an issue was more likely related to the metal on metal, I decided to place a ceramic head.  I felt comfortable placing the head on the trunnion as there was no corrosion or damage to it.  The 36+ 0 ceramic head was then placed onto the femoral neck.  Hips reduced with excellent stability with full extension, full external rotation, 70 degrees flexion, 40 degrees adduction, 90 degrees internal rotation, 90 degrees of flexion, and 70 degrees of internal rotation.  The wound was then copiously irrigated with saline solution and the short rotators and the pseudocapsule was reattached to the femur through drill holes with Ethibond suture. Fascia lata was closed over Hemovac drain with running #1  V-Loc.  Subcu closed with interrupted 2-0 Vicryl and subcuticular running 4-0 Monocryl.  Incisions cleaned and dried.  Steri-Strips and a bulky sterile dressing applied.  He was then awakened and transported to recovery in stable condition.  Note, that the surgical assistant was a medical necessity for this procedure to perform it in a safe and expeditious manner.  Assistance necessary for retraction of the nerve in vital neurovascular structures as well as for proper positioning of the limb in order to remove the old prosthesis and place the new implants without damage to them.  Please note that prior to closing the fascia lata we had injected the fascia lata, gluteal muscles, subcu tissues with a total of 20 mL Exparel and 30 mL of saline and then an additional 20 mL of 0.25% Marcaine was injected into the same tissues.     Ollen Gross, M.D.     FA/MEDQ  D:  09/24/2013  T:  09/25/2013  Job:  [id number]

## 2013-09-25 NOTE — Evaluation (Signed)
Occupational Therapy Evaluation Patient Details Name: Caryn SectionDavid O Godman MRN: [id number] DOB: [birth year] Today's Date: 09/25/2013    History of Present Illness Pt is s/p R hip revision. Pt with PMH of HTN and hip replacement 2005 (per pt)   Clinical Impression   Pt likely to d/c later today. Pt doing well. Reviewed THPs and practiced ADL. Educated on AE but pt states his wife will likely help with LB self care. All education completed.    Follow Up Recommendations  No OT follow up    Equipment Recommendations  None recommended by OT    Recommendations for Other Services       Precautions / Restrictions Precautions Precautions: Posterior Hip Restrictions Weight Bearing Restrictions: No      Mobility Bed Mobility                  Transfers Overall transfer level: Needs assistance Equipment used: Rolling walker (2 wheeled) Transfers: Sit to/from Stand Sit to Stand: Min guard         General transfer comment: verbal cues for THPs for LE management and also with hand placement.     Balance                                            ADL Overall ADL's : Needs assistance/impaired Eating/Feeding: Independent;Sitting   Grooming: Wash/dry hands;Set up;Sitting   Upper Body Bathing: Set up;Sitting   Lower Body Bathing: Moderate assistance;Sit to/from stand Lower Body Bathing Details (indicate cue type and reason): without AE Upper Body Dressing : Set up;Sitting   Lower Body Dressing: Moderate assistance;Sit to/from stand Lower Body Dressing Details (indicate cue type and reason): without AE Toilet Transfer: Min guard;Ambulation;BSC;RW   Toileting- ArchitectClothing Manipulation and Hygiene: Min guard;Sit to/from stand         General ADL Comments: Reviewed THPs with pt and he was able to state 2/3 initially on his own. pt has a reacher but doesnt have any of the other AE. Educated on all AE and practiced with sock aid. Pt states his wife will likely  help him in the am with bathing/dressing but verbalizes understanding of where to obtain AE if desired and how to use. Min verbal cues for LE management (extend R LE) prior to sitting/standing. Reviewed need to stand to perform toilet hygiene also. Pt has a tub and a tubseat. Recommend HH practice tub transfer prior to getting in and out of tub on his own. Demonstrated technique to pt.      Vision                     Perception     Praxis      Pertinent Vitals/Pain No complaint of     Hand Dominance     Extremity/Trunk Assessment Upper Extremity Assessment Upper Extremity Assessment: Overall WFL for tasks assessed           Communication Communication Communication: No difficulties   Cognition Arousal/Alertness: Awake/alert Behavior During Therapy: WFL for tasks assessed/performed Overall Cognitive Status: Within Functional Limits for tasks assessed                     General Comments       Exercises       Shoulder Instructions      Home Living Family/patient expects to be discharged to:: Private residence Living  Arrangements: Spouse/significant other Available Help at Discharge: Family               Bathroom Shower/Tub: Chief Strategy Officer: Standard     Home Equipment: Shower seat;Bedside commode;Adaptive equipment Adaptive Equipment: Reacher        Prior Functioning/Environment Level of Independence: Independent             OT Diagnosis:     OT Problem List:     OT Treatment/Interventions:      OT Goals(Current goals can be found in the care plan section) Acute Rehab OT Goals Patient Stated Goal: home  OT Frequency:     Barriers to D/C:            Co-evaluation              End of Session Equipment Utilized During Treatment: Rolling walker  Activity Tolerance: Patient tolerated treatment well Patient left: in chair;with call bell/phone within reach   Time: 1046-1109 OT Time Calculation  (min): 23 min Charges:  OT General Charges $OT Visit: 1 Procedure OT Evaluation $Initial OT Evaluation Tier I: 1 Procedure OT Treatments $Self Care/Home Management : 8-22 mins G-Codes:    Sabino Gasser Shareka Casale [phone] 09/25/2013, 11:22 AM

## 2013-09-25 NOTE — Discharge Summary (Signed)
Physician Discharge Summary   Patient ID: LORENSO Mccann MRN: [id number] DOB/AGE: December 13, 1955 58 y.o.  Admit date: 09/24/2013 Discharge date: 09/25/2013  Primary Diagnosis:  Painful right total hip arthroplasty.  Admission Diagnoses:  Past Medical History  Diagnosis Date  . Arthritis     osteoarthritis  . Obesity (BMI 30.0-34.9) 04/04/2013  . Hypertension   . Dysrhythmia 2000    single episode of afib with RVR with normal echo and rare atrial/ventricular ectopy on f/u Holter ([facility])  . Sleep apnea     do not wear mask/  . Pneumonia     hx of age 64  . History of kidney stones    Discharge Diagnoses:   Principal Problem:   Prosthetic wear following total hip arthroplasty  Estimated body mass index is 30.41 kg/(m^2) as calculated from the following:   Height as of this encounter: _0  (1.753 m).   Weight as of this encounter: 93.441 kg (206 lb).  Procedure(s) (LRB): RIGHT HIP BEARING SURFACE REVISION (Right)   Consults: None  HPI: Luis Mccann is a 58 year old male, had a right total hip  arthroplasty done approximately 10 years ago. He did very well  functionally, but has had persistent intermittent groin discomfort with  certain activities. He has had an extensive workup and it is felt that  this may be due to his bearing surface that he has metal-on-metal  prosthesis. I had a long discussion in regard to this and he opted for  bearing surface revision. He presents today for that procedure.  Laboratory Data: Admission on 09/24/2013, Discharged on 09/25/2013  Component Date Value Ref Range Status  . ABO/RH(D) 09/24/2013 O POS   Final  . Antibody Screen 09/24/2013 NEG   Final  . Sample Expiration 09/24/2013 09/27/2013   Final  . Color, Urine 09/24/2013 YELLOW  YELLOW Final  . APPearance 09/24/2013 CLEAR  CLEAR Final  . Specific Gravity, Urine 09/24/2013 1.020  1.005 - 1.030 Final  . pH 09/24/2013 6.0  5.0 - 8.0 Final  . Glucose, UA 09/24/2013 NEGATIVE  NEGATIVE mg/dL Final  .  Hgb urine dipstick 09/24/2013 TRACE* NEGATIVE Final  . Bilirubin Urine 09/24/2013 NEGATIVE  NEGATIVE Final  . Ketones, ur 09/24/2013 NEGATIVE  NEGATIVE mg/dL Final  . Protein, ur 09/24/2013 NEGATIVE  NEGATIVE mg/dL Final  . Urobilinogen, UA 09/24/2013 0.2  0.0 - 1.0 mg/dL Final  . Nitrite 09/24/2013 NEGATIVE  NEGATIVE Final  . Leukocytes, UA 09/24/2013 TRACE* NEGATIVE Final  . Squamous Epithelial / LPF 09/24/2013 RARE  RARE Final  . WBC, UA 09/24/2013 3-6  <3 WBC/hpf Final  . RBC / HPF 09/24/2013 3-6  <3 RBC/hpf Final  . Urine-Other 09/24/2013 MUCOUS PRESENT   Final  . ABO/RH(D) 09/24/2013 O POS   Final  . WBC 09/25/2013 9.2  4.0 - 10.5 K/uL Final  . RBC 09/25/2013 4.54  4.22 - 5.81 MIL/uL Final  . Hemoglobin 09/25/2013 12.9* 13.0 - 17.0 g/dL Final  . HCT 09/25/2013 37.2* 39.0 - 52.0 % Final  . MCV 09/25/2013 81.9  78.0 - 100.0 fL Final  . MCH 09/25/2013 28.4  26.0 - 34.0 pg Final  . MCHC 09/25/2013 34.7  30.0 - 36.0 g/dL Final  . RDW 09/25/2013 12.4  11.5 - 15.5 % Final  . Platelets 09/25/2013 211  150 - 400 K/uL Final  . Sodium 09/25/2013 139  137 - 147 mEq/L Final  . Potassium 09/25/2013 4.5  3.7 - 5.3 mEq/L Final  . Chloride 09/25/2013 100  96 - 112  mEq/L Final  . CO2 09/25/2013 29  19 - 32 mEq/L Final  . Glucose, Bld 09/25/2013 135* 70 - 99 mg/dL Final  . BUN 09/25/2013 17  6 - 23 mg/dL Final  . Creatinine, Ser 09/25/2013 1.04  0.50 - 1.35 mg/dL Final  . Calcium 09/25/2013 10.4  8.4 - 10.5 mg/dL Final  . GFR calc non Af Amer 09/25/2013 77* >90 mL/min Final  . GFR calc Af Amer 09/25/2013 89* >90 mL/min Final   Comment: (NOTE)                          The eGFR has been calculated using the CKD EPI equation.                          This calculation has not been validated in all clinical situations.                          eGFR's persistently <90 mL/min signify possible Chronic Kidney                          Disease.  Hospital Outpatient Visit on 09/17/2013  Component Date  Value Ref Range Status  . MRSA, PCR 09/17/2013 NEGATIVE  NEGATIVE Final  . Staphylococcus aureus 09/17/2013 NEGATIVE  NEGATIVE Final   Comment:                                 The Xpert SA Assay (FDA                          approved for NASAL specimens                          in patients over 69 years of age),                          is one component of                          a comprehensive surveillance                          program.  Test performance has                          been validated by [employer] for patients greater                          than or equal to 60 year old.                          It is not intended                          to diagnose infection nor to  guide or monitor treatment.  Marland Kitchen aPTT 09/17/2013 32  24 - 37 seconds Final  . WBC 09/17/2013 3.6* 4.0 - 10.5 K/uL Final  . RBC 09/17/2013 4.95  4.22 - 5.81 MIL/uL Final  . Hemoglobin 09/17/2013 14.2  13.0 - 17.0 g/dL Final  . HCT 09/17/2013 40.7  39.0 - 52.0 % Final  . MCV 09/17/2013 82.2  78.0 - 100.0 fL Final  . MCH 09/17/2013 28.7  26.0 - 34.0 pg Final  . MCHC 09/17/2013 34.9  30.0 - 36.0 g/dL Final  . RDW 09/17/2013 12.6  11.5 - 15.5 % Final  . Platelets 09/17/2013 262  150 - 400 K/uL Final  . Sodium 09/17/2013 139  137 - 147 mEq/L Final  . Potassium 09/17/2013 3.9  3.7 - 5.3 mEq/L Final  . Chloride 09/17/2013 101  96 - 112 mEq/L Final  . CO2 09/17/2013 25  19 - 32 mEq/L Final  . Glucose, Bld 09/17/2013 103* 70 - 99 mg/dL Final  . BUN 09/17/2013 18  6 - 23 mg/dL Final  . Creatinine, Ser 09/17/2013 1.06  0.50 - 1.35 mg/dL Final  . Calcium 09/17/2013 10.6* 8.4 - 10.5 mg/dL Final  . Total Protein 09/17/2013 6.9  6.0 - 8.3 g/dL Final  . Albumin 09/17/2013 4.0  3.5 - 5.2 g/dL Final  . AST 09/17/2013 20  0 - 37 U/L Final  . ALT 09/17/2013 20  0 - 53 U/L Final  . Alkaline Phosphatase 09/17/2013 46  39 - 117 U/L Final  . Total Bilirubin 09/17/2013  0.5  0.3 - 1.2 mg/dL Final  . GFR calc non Af Amer 09/17/2013 75* >90 mL/min Final  . GFR calc Af Amer 09/17/2013 87* >90 mL/min Final   Comment: (NOTE)                          The eGFR has been calculated using the CKD EPI equation.                          This calculation has not been validated in all clinical situations.                          eGFR's persistently <90 mL/min signify possible Chronic Kidney                          Disease.  Marland Kitchen Prothrombin Time 09/17/2013 12.4  11.6 - 15.2 seconds Final  . INR 09/17/2013 0.94  0.00 - 1.49 Final  . Color, Urine 09/17/2013 YELLOW  YELLOW Final  . APPearance 09/17/2013 CLEAR  CLEAR Final  . Specific Gravity, Urine 09/17/2013 1.021  1.005 - 1.030 Final  . pH 09/17/2013 6.0  5.0 - 8.0 Final  . Glucose, UA 09/17/2013 NEGATIVE  NEGATIVE mg/dL Final  . Hgb urine dipstick 09/17/2013 TRACE* NEGATIVE Final  . Bilirubin Urine 09/17/2013 NEGATIVE  NEGATIVE Final  . Ketones, ur 09/17/2013 NEGATIVE  NEGATIVE mg/dL Final  . Protein, ur 09/17/2013 NEGATIVE  NEGATIVE mg/dL Final  . Urobilinogen, UA 09/17/2013 1.0  0.0 - 1.0 mg/dL Final  . Nitrite 09/17/2013 NEGATIVE  NEGATIVE Final  . Leukocytes, UA 09/17/2013 NEGATIVE  NEGATIVE Final  . WBC, UA 09/17/2013 0-2  <3 WBC/hpf Final  . RBC / HPF 09/17/2013 3-6  <3 RBC/hpf Final     X-Rays:Dg Hip Complete Right  09/17/2013   CLINICAL DATA:  Total hip replacement on the right with pain  EXAM: RIGHT HIP - COMPLETE 2+ VIEW  COMPARISON:  June 23, 2003  FINDINGS: Frontal pelvis as well as frontal and lateral right hip images were obtained. There is a total hip prosthesis in the right which appears well seated. No fracture or dislocation. Note that there is moderate periacetabular loss of bony mineralization on the right. A lesser degree of periacetabular osteoporosis is noted on the left. There is mild narrowing of the left hip joint. There are several prostatic calculi.  IMPRESSION: Moderate periacetabular  osteoporosis on the right with total hip prosthesis in this region. No acute fracture or dislocation. There is a lesser degree of periacetabular osteoporosis on the left. Mild narrowing left hip joint.   Electronically Signed   By: Lowella Grip M.D.   On: 09/17/2013 10:53   Dg Pelvis Portable  09/24/2013   CLINICAL DATA:  Postop right hip.  EXAM: PORTABLE PELVIS 1-2 VIEWS  COMPARISON:  No prior.  FINDINGS: Good anatomic alignment status post total right hip replacement. No acute bony or joint abnormality.  IMPRESSION: No acute abnormality. Good anatomic alignment status post total right hip replacement .   Electronically Signed   By: Marcello Moores  Register   On: 09/24/2013 17:10    EKG: Orders placed in visit on 04/08/13  . EKG 12-LEAD     Hospital Course: Patient was admitted to [facility] and taken to the OR and underwent the above state procedure without complications.  Patient tolerated the procedure well and was later transferred to the recovery room and then to the orthopaedic floor for postoperative care.  They were given PO and IV analgesics for pain control following their surgery.  They were given 24 hours of postoperative antibiotics of  Anti-infectives   Start     Dose/Rate Route Frequency Ordered Stop   09/24/13 2000  ceFAZolin (ANCEF) IVPB 2 g/50 mL premix     2 g 100 mL/hr over 30 Minutes Intravenous Every 6 hours 09/24/13 1812 09/25/13 0315   09/24/13 1153  ceFAZolin (ANCEF) IVPB 2 g/50 mL premix     2 g 100 mL/hr over 30 Minutes Intravenous On call to O.R. 09/24/13 1153 09/24/13 1510     and started on DVT prophylaxis in the form of Aspirin.   PT and OT were ordered for total hip protocol.  The patient was allowed to be WBAT with therapy. Discharge planning was consulted to help with postop disposition and equipment needs.  Patient had a good night on the evening of surgery.  They started to get up OOB with therapy on day one.  Hemovac drain was pulled without  difficulty.  The knee immobilizer was removed and discontinued.  Patient was seen in rounds and felt that as long as he did well he would be ready to go home later that same day.Marland Kitchen  Discharge home with home health  Diet - Cardiac diet  Follow up - in 2 weeks  Activity - WBAT  Disposition - Home  Condition Upon Discharge - Good  D/C Meds - See DC Summary  DVT Prophylaxis - Aspirin 325 mg       Discharge Instructions   Call MD / Call 911    Complete by:  As directed   If you experience chest pain or shortness of breath, CALL 911 and be transported to the hospital emergency room.  If you develope a fever above 101 F, pus (white drainage) or increased drainage or  redness at the wound, or calf pain, call your surgeon's office.     Change dressing    Complete by:  As directed   You may change your dressing dressing daily with sterile 4 x 4 inch gauze dressing and paper tape.  Do not submerge the incision under water.     Constipation Prevention    Complete by:  As directed   Drink plenty of fluids.  Prune juice may be helpful.  You may use a stool softener, such as Colace (over the counter) 100 mg twice a day.  Use MiraLax (over the counter) for constipation as needed.     Diet - low sodium heart healthy    Complete by:  As directed      Discharge instructions    Complete by:  As directed   Pick up stool softner and laxative for home. Do not submerge incision under water. May shower starting on Friday. Continue to use ice for pain and swelling from surgery. Hip precautions.  Total Hip Protocol.  Aspirin 325 mg daily.     Do not sit on low chairs, stoools or toilet seats, as it may be difficult to get up from low surfaces    Complete by:  As directed      Driving restrictions    Complete by:  As directed   No driving until released by the physician.     Follow the hip precautions as taught in Physical Therapy    Complete by:  As directed      Increase activity slowly as tolerated     Complete by:  As directed      Lifting restrictions    Complete by:  As directed   No lifting until released by the physician.     Patient may shower    Complete by:  As directed   You may shower without a dressing once there is no drainage.  Do not wash over the wound.  If drainage remains, do not shower until drainage stops.     TED hose    Complete by:  As directed   Use stockings (TED hose) for 3 weeks on both leg(s).  You may remove them at night for sleeping.     Weight bearing as tolerated    Complete by:  As directed             Medication List         acetaminophen 500 MG tablet  Commonly known as:  TYLENOL  Take 500 mg by mouth every 6 (six) hours as needed for mild pain.     amLODipine 10 MG tablet  Commonly known as:  NORVASC  Take 10 mg by mouth every morning.     aspirin 325 MG EC tablet  Take 1 tablet (325 mg total) by mouth daily with breakfast.     Iron 18 MG Tbcr  Take 1 tablet by mouth daily.     methocarbamol 500 MG tablet  Commonly known as:  ROBAXIN  Take 1 tablet (500 mg total) by mouth every 6 (six) hours as needed for muscle spasms.     oxyCODONE 5 MG immediate release tablet  Commonly known as:  Oxy IR/ROXICODONE  Take 1-2 tablets (5-10 mg total) by mouth every 3 (three) hours as needed for moderate pain, severe pain or breakthrough pain.     traMADol 50 MG tablet  Commonly known as:  ULTRAM  Take 1-2 tablets (50-100 mg total) by mouth every  6 (six) hours as needed (mild to moderate pain).     valsartan-hydrochlorothiazide 320-25 MG per tablet  Commonly known as:  DIOVAN-HCT  Take 1 tablet by mouth every morning.       Follow-up Information   Follow up with Gearlean Alf, MD. Schedule an appointment as soon as possible for a visit in 2 weeks.   Specialty:  Orthopedic Surgery   Contact information:   [address] [place] [address] [phone]       Signed: Arlee Muslim, PA-C Orthopaedic  Surgery 10/04/2013, 12:56 PM

## 2013-09-25 NOTE — Evaluation (Signed)
Physical Therapy Evaluation Patient Details Name: Luis Mccann MRN: [id number] DOB: 10/25/1955 Today's Date: 09/25/2013   History of Present Illness  Pt is s/p R hip revision. Pt with PMH of HTN and hip replacement 2005 (per pt)  Clinical Impression  Pt s/p R THR revision presents with decreased R LE strength/ROM, post THP and post op pain limiting functional mobility.  Pt should progress to d/c home with follow up HHPT and assist of family    Follow Up Recommendations Home health PT    Equipment Recommendations  Rolling walker with 5" wheels    Recommendations for Other Services OT consult     Precautions / Restrictions Precautions Precautions: Posterior Hip Restrictions Weight Bearing Restrictions: No      Mobility  Bed Mobility Overal bed mobility: Needs Assistance Bed Mobility: Supine to Sit     Supine to sit: Min assist     General bed mobility comments: cues for sequence and use of R LE to self assist  Transfers Overall transfer level: Needs assistance Equipment used: Rolling walker (2 wheeled) Transfers: Sit to/from Stand Sit to Stand: Min guard         General transfer comment: verbal cues for THPs for LE management and also with hand placement.   Ambulation/Gait Ambulation/Gait assistance: Min assist;Min guard Ambulation Distance (Feet): 140 Feet Assistive device: Rolling walker (2 wheeled) Gait Pattern/deviations: Step-to pattern;Decreased step length - right;Decreased step length - left;Shuffle;Trunk flexed Gait velocity: decr   General Gait Details: cues for posture, sequence, stride length, and position from [employer]            Wheelchair Mobility    Modified Rankin (Stroke Patients Only)       Balance                                             Pertinent Vitals/Pain 2/10; premed, ice pack provided    Home Living Family/patient expects to be discharged to:: Private residence Living Arrangements:  Spouse/significant other Available Help at Discharge: Family Type of Home: House Home Access: Stairs to enter Entrance Stairs-Rails: None Entrance Stairs-Number of Steps: 2 Home Layout: Two level Home Equipment: Shower seat;Bedside commode;Adaptive equipment      Prior Function Level of Independence: Independent               Hand Dominance   Dominant Hand: Right    Extremity/Trunk Assessment   Upper Extremity Assessment: Overall WFL for tasks assessed           Lower Extremity Assessment: RLE deficits/detail RLE Deficits / Details: Hip strength 3-/5 with AAROM at hip to 85 flex and 25 abd    Cervical / Trunk Assessment: Normal  Communication   Communication: No difficulties  Cognition Arousal/Alertness: Awake/alert Behavior During Therapy: WFL for tasks assessed/performed Overall Cognitive Status: Within Functional Limits for tasks assessed                      General Comments      Exercises Total Joint Exercises Ankle Circles/Pumps: AROM;Supine;Both;15 reps Quad Sets: AROM;Both;10 reps;Supine Heel Slides: AAROM;15 reps;Supine;Right Hip ABduction/ADduction: AAROM;Right;15 reps;Supine      Assessment/Plan    PT Assessment Patient needs continued PT services  PT Diagnosis Difficulty walking   PT Problem List Decreased strength;Decreased range of motion;Decreased activity tolerance;Decreased mobility;Decreased knowledge of use of DME;Pain  PT  Treatment Interventions DME instruction;Gait training;Stair training;Functional mobility training;Therapeutic activities;Therapeutic exercise;Patient/family education   PT Goals (Current goals can be found in the Care Plan section) Acute Rehab PT Goals Patient Stated Goal: home PT Goal Formulation: With patient Time For Goal Achievement: 10/03/13 Potential to Achieve Goals: Good    Frequency 7X/week   Barriers to discharge        Co-evaluation               End of Session Equipment  Utilized During Treatment: Gait belt Activity Tolerance: Patient tolerated treatment well Patient left: in chair;with call bell/phone within reach Nurse Communication: Mobility status         Time: 1008-1050 PT Time Calculation (min): 42 min   Charges:   PT Evaluation $Initial PT Evaluation Tier I: 1 Procedure PT Treatments $Gait Training: 8-22 mins $Therapeutic Exercise: 8-22 mins   PT G Codes:          [name] 09/25/2013, 12:28 PM

## 2013-09-25 NOTE — Progress Notes (Signed)
Physical Therapy Treatment Patient Details Name: Luis Mccann MRN: [id number] DOB: 1955-05-19 Today's Date: 09/25/2013    History of Present Illness Pt is s/p R hip revision. Pt with PMH of HTN and hip replacement 2005 (per pt)    PT Comments    Spouse present during session.  Practiced a flight of steps using one rail and one crutch.  Also practiced going up 2 steps backward using RW.  Pt instructed on proper tech getting in/out car.  Instructed on use of ICE.  Instructed ob using belt to assist L LE on/off bed.  Reviewed THP and demon.  Follow Up Recommendations  Home health PT     Equipment Recommendations  Rolling walker with 5" wheels    Recommendations for Other Services       Precautions / Restrictions Precautions Precautions: Posterior Hip Precaution Comments: pt verbally recalls 3/3 THP Restrictions Weight Bearing Restrictions: No    Mobility  Bed Mobility               General bed mobility comments: Pt OOB in recliner  Transfers Overall transfer level: Needs assistance Equipment used: Rolling walker (2 wheeled) Transfers: Sit to/from Stand Sit to Stand: Supervision;Min guard         General transfer comment: 50% VC's on proper tech esp to avoid hip flex >90 degrees  Ambulation/Gait Ambulation/Gait assistance: Supervision;Min guard Ambulation Distance (Feet): 135 Feet Assistive device: Rolling walker (2 wheeled) Gait Pattern/deviations: Step-to pattern;Trunk flexed Gait velocity: decreased   General Gait Details: cues for posture, sequence, stride length, and position from RW plus increased time   Stairs Stairs: Yes Stairs assistance: Min guard Stair Management: No rails;One rail Right;With crutches Number of Stairs: 14 (12 steps forward with one crutch and one rail plus 2 steps backward with RW) General stair comments: spouse present during session for education on safe handling duering stair training.  Wheelchair Mobility    Modified  Rankin (Stroke Patients Only)       Balance                                    Cognition                            Exercises      General Comments        Pertinent Vitals/Pain     Home Living                      Prior Function            PT Goals (current goals can now be found in the care plan section) Acute Rehab PT Goals Patient Stated Goal: home Progress towards PT goals: Progressing toward goals    Frequency       PT Plan      Co-evaluation             End of Session Equipment Utilized During Treatment: Gait belt Activity Tolerance: Patient tolerated treatment well Patient left: in chair;with call bell/phone within reach     Time: 1414-1451 PT Time Calculation (min): 37 min  Charges:  $Gait Training: 8-22 mins $Therapeutic Activity: 8-22 mins                    G Codes:      Felecia Shelling  PTA [facility]  Acute  Rehab Pager      [phone]

## 2013-09-25 NOTE — Progress Notes (Signed)
   Subjective: 1 Day Post-Op Procedure(s) (LRB): RIGHT HIP BEARING SURFACE REVISION (Right) Patient reports pain as mild.   Patient seen in rounds for Dr. Lequita Halt. Patient is well, and has had no acute complaints or problems Patient is ready to go home  Objective: Vital signs in last 24 hours: Temp:  [97.5 F (36.4 C)-98.3 F (36.8 C)] 98 F (36.7 C) (05/27 0940) Pulse Rate:  [57-71] 63 (05/27 0940) Resp:  [12-20] 17 (05/27 0940) BP: (124-152)/(68-88) 146/88 mmHg (05/27 0940) SpO2:  [94 %-100 %] 98 % (05/27 0940) Weight:  [93.441 kg (206 lb)] 93.441 kg (206 lb) (05/26 1800)  Intake/Output from previous day:  Intake/Output Summary (Last 24 hours) at 09/25/13 1117 Last data filed at 09/25/13 0900  Gross per 24 hour  Intake 3943.33 ml  Output   1250 ml  Net 2693.33 ml    Intake/Output this shift: Total I/O In: -  Out: 10 [Drains:10]  Labs:  Recent Labs  09/25/13 0427  HGB 12.9*    Recent Labs  09/25/13 0427  WBC 9.2  RBC 4.54  HCT 37.2*  PLT 211    Recent Labs  09/25/13 0427  NA 139  K 4.5  CL 100  CO2 29  BUN 17  CREATININE 1.04  GLUCOSE 135*  CALCIUM 10.4   No results found for this basename: LABPT, INR,  in the last 72 hours  EXAM: General - Patient is Alert, Appropriate and Oriented Extremity - Neurovascular intact Sensation intact distally Dressing - clean, dry, no drainage Motor Function - intact, moving foot and toes well on exam.  Hemovac pulled.  Assessment/Plan: 1 Day Post-Op Procedure(s) (LRB): RIGHT HIP BEARING SURFACE REVISION (Right) Procedure(s) (LRB): RIGHT HIP BEARING SURFACE REVISION (Right) Past Medical History  Diagnosis Date  . Arthritis     osteoarthritis  . Obesity (BMI 30.0-34.9) 04/04/2013  . Hypertension   . Dysrhythmia 2000    single episode of afib with RVR with normal echo and rare atrial/ventricular ectopy on f/u Holter ([facility])  . Sleep apnea     do not wear mask/  . Pneumonia     hx of age 40  .  History of kidney stones    Principal Problem:   Prosthetic wear following total hip arthroplasty  Estimated body mass index is 30.41 kg/(m^2) as calculated from the following:   Height as of this encounter: 5\' 9"  (1.753 m).   Weight as of this encounter: 93.441 kg (206 lb). Up with therapy Discharge home with home health Diet - Cardiac diet Follow up - in 2 weeks Activity - WBAT Disposition - Home Condition Upon Discharge - Good D/C Meds - See DC Summary DVT Prophylaxis - Aspirin 325 mg  Avel Peace, PA-C Orthopaedic Surgery 09/25/2013, 11:17 AM

## 2013-09-25 NOTE — Progress Notes (Signed)
Utilization review completed.  

## 2013-09-26 NOTE — Progress Notes (Signed)
Discharge summary sent to payer through [facility]  

## 2013-11-11 ENCOUNTER — Emergency Department (HOSPITAL_COMMUNITY)
Admission: EM | Admit: 2013-11-11 | Discharge: 2013-11-12 | Disposition: A | Discharge status: Home / Self Care | Payer: BC Managed Care – PPO | Attending: Emergency Medicine | Admitting: Emergency Medicine

## 2013-11-11 ENCOUNTER — Encounter (HOSPITAL_COMMUNITY): Payer: Self-pay | Admitting: Emergency Medicine

## 2013-11-11 ENCOUNTER — Emergency Department (HOSPITAL_COMMUNITY): Payer: BC Managed Care – PPO

## 2013-11-11 DIAGNOSIS — Z79899 Other long term (current) drug therapy: Secondary | ICD-10-CM | POA: Insufficient documentation

## 2013-11-11 DIAGNOSIS — S73004A Unspecified dislocation of right hip, initial encounter: Secondary | ICD-10-CM

## 2013-11-11 DIAGNOSIS — Z96649 Presence of unspecified artificial hip joint: Secondary | ICD-10-CM | POA: Insufficient documentation

## 2013-11-11 DIAGNOSIS — Y9389 Activity, other specified: Secondary | ICD-10-CM | POA: Insufficient documentation

## 2013-11-11 DIAGNOSIS — W07XXXA Fall from chair, initial encounter: Secondary | ICD-10-CM | POA: Insufficient documentation

## 2013-11-11 DIAGNOSIS — Z8701 Personal history of pneumonia (recurrent): Secondary | ICD-10-CM | POA: Insufficient documentation

## 2013-11-11 DIAGNOSIS — I10 Essential (primary) hypertension: Secondary | ICD-10-CM | POA: Insufficient documentation

## 2013-11-11 DIAGNOSIS — Z6834 Body mass index (BMI) 34.0-34.9, adult: Secondary | ICD-10-CM | POA: Insufficient documentation

## 2013-11-11 DIAGNOSIS — Z87891 Personal history of nicotine dependence: Secondary | ICD-10-CM | POA: Insufficient documentation

## 2013-11-11 DIAGNOSIS — T84029A Dislocation of unspecified internal joint prosthesis, initial encounter: Secondary | ICD-10-CM | POA: Insufficient documentation

## 2013-11-11 DIAGNOSIS — Y929 Unspecified place or not applicable: Secondary | ICD-10-CM | POA: Insufficient documentation

## 2013-11-11 DIAGNOSIS — E669 Obesity, unspecified: Secondary | ICD-10-CM | POA: Insufficient documentation

## 2013-11-11 DIAGNOSIS — I499 Cardiac arrhythmia, unspecified: Secondary | ICD-10-CM | POA: Insufficient documentation

## 2013-11-11 DIAGNOSIS — M1990 Unspecified osteoarthritis, unspecified site: Secondary | ICD-10-CM | POA: Insufficient documentation

## 2013-11-11 DIAGNOSIS — Z7982 Long term (current) use of aspirin: Secondary | ICD-10-CM | POA: Insufficient documentation

## 2013-11-11 DIAGNOSIS — Z87442 Personal history of urinary calculi: Secondary | ICD-10-CM | POA: Insufficient documentation

## 2013-11-11 MED ORDER — ONDANSETRON HCL 4 MG/2ML IJ SOLN
4.0000 mg | Freq: Once | INTRAMUSCULAR | Status: AC
  Administered 2013-11-11: 4 mg via INTRAVENOUS
  Filled 2013-11-11: qty 2

## 2013-11-11 MED ORDER — PROPOFOL 10 MG/ML IV BOLUS
0.5000 mg/kg | Freq: Once | INTRAVENOUS | Status: AC
  Administered 2013-11-11: 60 mg via INTRAVENOUS
  Filled 2013-11-11: qty 1

## 2013-11-11 MED ORDER — SODIUM CHLORIDE 0.9 % IV SOLN
INTRAVENOUS | Status: DC
  Administered 2013-11-11: 15:00:00 via INTRAVENOUS

## 2013-11-11 MED ORDER — DIAZEPAM 5 MG/ML IJ SOLN
5.0000 mg | Freq: Once | INTRAMUSCULAR | Status: AC
  Administered 2013-11-11: 5 mg via INTRAVENOUS
  Filled 2013-11-11: qty 2

## 2013-11-11 MED ORDER — KETAMINE HCL 10 MG/ML IJ SOLN
0.5000 mg/kg | Freq: Once | INTRAMUSCULAR | Status: AC
  Administered 2013-11-11: 60 mg via INTRAVENOUS
  Filled 2013-11-11: qty 4.7

## 2013-11-11 MED ORDER — HYDROMORPHONE HCL PF 1 MG/ML IJ SOLN
1.0000 mg | Freq: Once | INTRAMUSCULAR | Status: AC
Start: 2013-11-11 — End: 2013-11-11
  Administered 2013-11-11: 1 mg via INTRAVENOUS
  Filled 2013-11-11: qty 1

## 2013-11-11 NOTE — Discharge Instructions (Signed)
Hip Dislocation °Hip dislocation is the displacement of the "ball" at the head of your thigh bone (femur) from its socket in the hip bone (pelvis). The ball-and-socket structure of the hip joint gives it a lot of stability, while allowing it to move freely. Therefore, a lot of force is required to displace the femur from its socket. A hip dislocation is an emergency. If you believe you have dislocated your hip and cannot move your leg, call for help immediately. Do not try to move. °CAUSES °The most common cause of hip dislocation is motor vehicle accidents. However, force from falls from a height (a ladder or building), injuries from contact sports, or injuries from industrial accidents can be enough to dislocate your hip. °SYMPTOMS °A hip dislocation is very painful. If you have a dislocated hip, you will not be able to move your hip. If you have nerve damage, you may not have feeling in your lower leg, foot, or ankle.  °DIAGNOSIS °Usually, your caregiver can diagnose a hip dislocation by looking at the position of your leg. Generally, X-ray exams are done to check for fractures in your femur or pelvis. The leg of the dislocated hip will appear shorter than the other leg, and your foot will be turned inward. °TREATMENT  °Your caregiver can manipulate your bones back into the joint (reduction). If there are no other complications involved with your dislocation, such as fractures or damage to blood vessels or nerves, this procedure can be done without surgery. Before this procedure, you will be given medicine so that you will not feel pain (anesthetic). Often specialized imaging exams are done after the reduction (magnetic resonance imaging [MRI] or computed tomography [CT]) to check for loose pieces of cartilage or bone in the joint. °If a manual reduction fails or you have nerve damage, damage to your blood vessels, or bone fractures, surgery will be necessary to perform the reduction.  °HOME CARE  INSTRUCTIONS °The following measures can help to reduce pain and speed up the healing process: °· Rest your injured joint. Do not move your joint if it is painful. Also, avoid activities similar to the one that caused your injury. °· Apply ice to your injured joint for 1 to 2 days after your reduction or as directed by your caregiver. Applying ice helps to reduce inflammation and pain. °¨ Put ice in a plastic bag. °¨ Place a towel between your skin and the bag. °¨ Leave the ice on for 15 to 20 minutes at a time, every 2 hours while you are awake. °· Use crutches or a walker as directed by your caregiver. °· Exercise your hip and leg as directed by your caregiver. °· Take over-the-counter or prescription medicine for pain as directed by your caregiver. °SEEK IMMEDIATE MEDICAL CARE IF: °· Your pain becomes worse rather than better. °· You feel like your hip has become dislocated again. °MAKE SURE YOU: °· Understand these instructions. °· Will watch your condition. °· Will get help right away if you are not doing well or get worse. °Document Released: 01/11/2001 Document Revised: 07/11/2011 Document Reviewed: 09/16/2010 °[employer]® Patient Information ©2015 [employer]. This information is not intended to replace advice given to you by your health care provider. Make sure you discuss any questions you have with your health care provider. ° °

## 2013-11-11 NOTE — ED Notes (Addendum)
Per EMS-pt states he just had right hip replacement on May 26. Pt states that he was sitting in a car and hip popped out of place. States that this all happened while he was seated. 150 Fent given.

## 2013-11-11 NOTE — ED Notes (Signed)
Bed: [id number] Expected date:  Expected time:  Means of arrival:  Comments: EMS- 58yo M, hip pain/deformity noted

## 2013-11-11 NOTE — ED Provider Notes (Signed)
CSN: [id number]     Arrival date & time 11/11/13  1323 History   First MD Initiated Contact with Patient 11/11/13 1348     Chief Complaint  Patient presents with  . Hip Pain     (Consider location/radiation/quality/duration/timing/severity/associated sxs/prior Treatment) HPI  58yM with R hip pain. Pt was getting up from a chair when he felt like his hip moved and now in severe pain. Happened just before arrival. S/p revision of prosthetic hip in the middle of May. Had been doing well up to this. No numbness or tingling. No prior dislocation. No other complaints.   Past Medical History  Diagnosis Date  . Arthritis     osteoarthritis  . Obesity (BMI 30.0-34.9) 04/04/2013  . Hypertension   . Dysrhythmia 2000    single episode of afib with RVR with normal echo and rare atrial/ventricular ectopy on f/u Holter ([facility])  . Sleep apnea     do not wear mask/  . Pneumonia     hx of age 55  . History of kidney stones    Past Surgical History  Procedure Laterality Date  . Joint replacement  1995    right hip  . Colonoscopy    . Lithotripsy      x4  . Kidney stone surgery    . Nasal septum surgery  1980's  . Achilles tendon repair Left 1987 and 1990  . Shoulder surgery Left 1990's    for burr  . Shoulder surgery Right 2010, 2012    abuttment and rotator cuff repair  . Nasal septoplasty w/ turbinoplasty Bilateral 01/02/2013    Procedure: NASAL SEPTOPLASTY WITH TURBINATE REDUCTION;  Surgeon: Flo Shanks, MD;  Location: [facility];  Service: ENT;  Laterality: Bilateral;  . Hip surgery Right     arthoscopic x1, one other surgery   . Incision and drainage hip Right 09/24/2013    Procedure: RIGHT HIP BEARING SURFACE REVISION;  Surgeon: Loanne Drilling, MD;  Location: [facility] ORS;  Service: Orthopedics;  Laterality: Right;   History reviewed. No pertinent family history. History  Substance Use Topics  . Smoking status: Former Smoker    Types: Cigarettes  . Smokeless tobacco: Never Used   Comment: quit 30 years ago-social smoker  . Alcohol Use: Yes     Comment: occasional    Review of Systems  All systems reviewed and negative, other than as noted in HPI.   Allergies  Review of patient's allergies indicates no known allergies.  Home Medications   Prior to Admission medications   Medication Sig Start Date End Date Taking? Authorizing Provider  amLODipine (NORVASC) 10 MG tablet Take 10 mg by mouth every morning.   Yes Historical Provider, MD  aspirin EC 325 MG EC tablet Take 1 tablet (325 mg total) by mouth daily with breakfast. 09/25/13  Yes Avel Peace, PA-C  Ferrous Fumarate (IRON) 18 MG TBCR Take 1 tablet by mouth daily.   Yes Historical Provider, MD  oxyCODONE (OXY IR/ROXICODONE) 5 MG immediate release tablet Take 1-2 tablets (5-10 mg total) by mouth every 3 (three) hours as needed for moderate pain, severe pain or breakthrough pain. 09/25/13  Yes Avel Peace, PA-C  valsartan-hydrochlorothiazide (DIOVAN-HCT) 320-25 MG per tablet Take 1 tablet by mouth every morning.   Yes Historical Provider, MD   BP 149/97  Pulse 64  Temp(Src) 98.4 F (36.9 C)  Resp 18  Wt 205 lb 0.4 oz (93 kg)  SpO2 99% Physical Exam  Nursing note and vitals reviewed. Constitutional:  He appears well-developed and well-nourished. No distress.  HENT:  Head: Normocephalic and atraumatic.  Eyes: Conjunctivae are normal. Right eye exhibits no discharge. Left eye exhibits no discharge.  Neck: Neck supple.  Cardiovascular: Normal rate, regular rhythm and normal heart sounds.  Exam reveals no gallop and no friction rub.   No murmur heard. Pulmonary/Chest: Effort normal and breath sounds normal. No respiratory distress.  Abdominal: Soft. He exhibits no distension. There is no tenderness.  Musculoskeletal: He exhibits no edema and no tenderness.  R hip partially flexed. Severe pain with attempted ROM. NVI distally. Surgical site appears to be healing well.   Neurological: He is alert.  Skin:  Skin is warm and dry.  Psychiatric: He has a normal mood and affect. His behavior is normal. Thought content normal.    ED Course  Reduction of dislocation - Closed R Hip Date/Time: 11/11/2013 4:10 PM Performed by: Raeford Razor Authorized by: Raeford Razor Consent: Verbal consent obtained. written consent obtained. Risks and benefits: risks, benefits and alternatives were discussed Consent given by: patient Required items: required blood products, implants, devices, and special equipment available Patient identity confirmed: verbally with patient and provided demographic data Time out: Immediately prior to procedure a "time out" was called to verify the correct patient, procedure, equipment, support staff and site/side marked as required. Local anesthesia used: no Patient sedated: yes Sedation type: moderate (conscious) sedation Sedatives: ketamine and propofol Analgesia: fentanyl Vitals: Vital signs were monitored during sedation. Patient tolerance: Patient tolerated the procedure well with no immediate complications.    Procedural Sedation:  Preprocedure  Pre-anesthesia/induction confirmation of laterality/correct procedure site including "time-out."  Provider confirms review of the nurses' note, allergies, medications, pertinent labs, PMH, pre-induction vital signs, pulse oximetry, pain level, and ECG (as applicable), and patient condition satisfactory for commencing with order for sedation and procedure.  Medication: 60 mg propofol IV                    60 mg ketamine IV  Total time of sedation/monitoring: 30 minutes   Patient tolerated procedure and procedural sedation component as expected without apparent immediate complications.  Physician confirms procedural medication orders as administered, patient was assessed by physician post-procedure, and confirms post-sedation plan of care and disposition.  (including critical care time)   Labs Review Labs Reviewed - No  data to display  Imaging Review No results found.  Dg Hip Complete Right  11/11/2013   CLINICAL DATA:  Right hip pain.  EXAM: RIGHT HIP - COMPLETE 2+ VIEW  COMPARISON:  Sep 17, 2013.  FINDINGS: Status post right total hip arthroplasty. Superior and posterior dislocation of the femoral prosthesis from the acetabular component is noted. No definite fracture is noted.  IMPRESSION: Superior and posterior dislocation of right hip prosthesis.   Electronically Signed   By: Roque Lias M.D.   On: 11/11/2013 14:51   Dg Hip Portable 1 View Right  11/11/2013   CLINICAL DATA:  Right hip reduction  EXAM: PORTABLE RIGHT HIP - 1 VIEW  COMPARISON:  Same dates  FINDINGS: There has been apparent relocation of the right femoral component of total hip arthroplasty with respect to the acetabular component. No fracture line is identified. No evidence for hardware failure.  IMPRESSION: Apparent right prosthetic hip relocation.   Electronically Signed   By: Christiana Pellant M.D.   On: 11/11/2013 16:50    EKG Interpretation None      MDM   Final diagnoses:  Dislocation closed, hip, right,  initial encounter    58yM with closed prosthetic R hip dislocation. Revision less than 2 months ago. Discussed with Dr Shelle IronBeane, orthopedics. He is fine with myself attempting reduction. Reduced w/o apparent complication. Pt placed in knee immobilizer. Needs to follow-up with Dr Lequita HaltAluisio.     Raeford RazorStephen Kenyia Wambolt, MD 11/14/13 [phone]

## 2013-11-25 ENCOUNTER — Encounter: Payer: Self-pay | Admitting: Cardiovascular Disease

## 2013-11-25 ENCOUNTER — Ambulatory Visit (INDEPENDENT_AMBULATORY_CARE_PROVIDER_SITE_OTHER): Payer: BC Managed Care – PPO | Admitting: Cardiovascular Disease

## 2013-11-25 VITALS — BP 142/84 | HR 65 | Resp 16 | Ht 69.0 in | Wt 212.5 lb

## 2013-11-25 DIAGNOSIS — I4891 Unspecified atrial fibrillation: Secondary | ICD-10-CM

## 2013-11-25 DIAGNOSIS — I480 Paroxysmal atrial fibrillation: Secondary | ICD-10-CM

## 2013-11-25 DIAGNOSIS — R011 Cardiac murmur, unspecified: Secondary | ICD-10-CM

## 2013-11-25 DIAGNOSIS — G4733 Obstructive sleep apnea (adult) (pediatric): Secondary | ICD-10-CM

## 2013-11-25 DIAGNOSIS — I10 Essential (primary) hypertension: Secondary | ICD-10-CM

## 2013-11-25 NOTE — Progress Notes (Addendum)
Patient ID: Luis Mccann, male   DOB: 06-Aug-1955, 58 y.o.   MRN: [id number]      Reason for office visit Atrial fibrillation, hypertension, obstructive sleep apnea   Luis Mccann is a 58 year old gentleman who first came under cardiology care when he presented with an episode of paroxysmal atrial fibrillation in the year 2000. At that time he was in the midst of a renal colic/nephrolithiasis and also had some orthopedic problems. He was treated with beta blockers but did not tolerate these well due to fatigue. He has not had any atrial fibrillation recurrence that we are aware of in the last 14 years. He was diagnosed with obstructive sleep apnea and now is much more comfortable with CPAP after undergoing a nasal surgery by Dr.Wolicki.   He required a prosthetic hip revision in May and then had a hip dislocation in July 2015. He has had limited opportunity to exercise because of this. He weighs 2 pounds more than he did at his last appointment. He has no cardiac complaints.    No Known Allergies  Current Outpatient Prescriptions  Medication Sig Dispense Refill  . amLODipine (NORVASC) 10 MG tablet Take 10 mg by mouth every morning.      Marland Kitchen aspirin EC 325 MG EC tablet Take 1 tablet (325 mg total) by mouth daily with breakfast.  28 tablet  0  . DOXYLAMINE SUCCINATE, SLEEP, PO Take 1 tablet by mouth at bedtime.      . Ferrous Fumarate (IRON) 18 MG TBCR Take 1 tablet by mouth daily.      Marland Kitchen oxyCODONE (OXY IR/ROXICODONE) 5 MG immediate release tablet Take 1-2 tablets (5-10 mg total) by mouth every 3 (three) hours as needed for moderate pain, severe pain or breakthrough pain.  80 tablet  0  . valsartan-hydrochlorothiazide (DIOVAN-HCT) 320-25 MG per tablet Take 1 tablet by mouth every morning.       No current facility-administered medications for this visit.    Past Medical History  Diagnosis Date  . Arthritis     osteoarthritis  . Obesity (BMI 30.0-34.9) 04/04/2013  . Hypertension   . Dysrhythmia  2000    single episode of afib with RVR with normal echo and rare atrial/ventricular ectopy on f/u Holter ([facility])  . Sleep apnea     do not wear mask/  . Pneumonia     hx of age 58  . History of kidney stones     Past Surgical History  Procedure Laterality Date  . Joint replacement  1995    right hip  . Colonoscopy    . Lithotripsy      x4  . Kidney stone surgery    . Nasal septum surgery  1980's  . Achilles tendon repair Left 1987 and 1990  . Shoulder surgery Left 1990's    for burr  . Shoulder surgery Right 2010, 2012    abuttment and rotator cuff repair  . Nasal septoplasty w/ turbinoplasty Bilateral 01/02/2013    Procedure: NASAL SEPTOPLASTY WITH TURBINATE REDUCTION;  Surgeon: Flo Shanks, MD;  Location: [facility];  Service: ENT;  Laterality: Bilateral;  . Hip surgery Right     arthoscopic x1, one other surgery   . Incision and drainage hip Right 09/24/2013    Procedure: RIGHT HIP BEARING SURFACE REVISION;  Surgeon: Loanne Drilling, MD;  Location: [facility] ORS;  Service: Orthopedics;  Laterality: Right;    No family history on file.  History   Social History  . Marital Status: Married  Spouse Name: N/A    Number of Children: N/A  . Years of Education: N/A   Occupational History  . Not on file.   Social History Main Topics  . Smoking status: Former Smoker    Types: Cigarettes  . Smokeless tobacco: Never Used     Comment: quit 30 years ago-social smoker  . Alcohol Use: Yes     Comment: occasional  . Drug Use: No  . Sexual Activity: Not on file   Other Topics Concern  . Not on file   Social History Narrative  . No narrative on file    Review of systems: The patient specifically denies any chest pain at rest or with exertion, dyspnea at rest or with exertion, orthopnea, paroxysmal nocturnal dyspnea, syncope, palpitations, focal neurological deficits, intermittent claudication, lower extremity edema, unexplained weight gain, cough, hemoptysis or wheezing.  The  patient also denies abdominal pain, nausea, vomiting, dysphagia, diarrhea, constipation, polyuria, polydipsia, dysuria, hematuria, frequency, urgency, abnormal bleeding or bruising, fever, chills, unexpected weight changes, mood swings, change in skin or hair texture, change in voice quality, auditory or visual problems, allergic reactions or rashes, new musculoskeletal complaints other than usual "aches and pains".   PHYSICAL EXAM BP 142/84  Pulse 65  Resp 16  Ht 5\' 9"  (1.753 m)  Wt 212 lb 8 oz (96.389 kg)  BMI 31.37 kg/m2  General: Alert, oriented x3, no distress Head: no evidence of trauma, PERRL, EOMI, no exophtalmos or lid lag, no myxedema, no xanthelasma; normal ears, nose and oropharynx Neck: normal jugular venous pulsations and no hepatojugular reflux; brisk carotid pulses without delay and no carotid bruits Chest: clear to auscultation, no signs of consolidation by percussion or palpation, normal fremitus, symmetrical and full respiratory excursions Cardiovascular: normal position and quality of the apical impulse, regular rhythm, normal first and second heart sounds, new 1-2/6 early peaking systolic murmur at the right upper sternal border, rubs or gallops Abdomen: no tenderness or distention, no masses by palpation, no abnormal pulsatility or arterial bruits, normal bowel sounds, no hepatosplenomegaly Extremities: no clubbing, cyanosis or edema; 2+ radial, ulnar and brachial pulses bilaterally; 2+ right femoral, posterior tibial and dorsalis pedis pulses; 2+ left femoral, posterior tibial and dorsalis pedis pulses; no subclavian or femoral bruits Neurological: grossly nonfocal   EKG: Normal sinus rhythm, left axis deviation, no change  BMET    Component Value Date/Time   NA 139 09/25/2013 0427   K 4.5 09/25/2013 0427   CL 100 09/25/2013 0427   CO2 29 09/25/2013 0427   GLUCOSE 135* 09/25/2013 0427   BUN 17 09/25/2013 0427   CREATININE 1.04 09/25/2013 0427   CALCIUM 10.4 09/25/2013  0427   GFRNONAA 77* 09/25/2013 0427   GFRAA 89* 09/25/2013 0427     ASSESSMENT AND PLAN  Paroxysmal atrial fibrillation  No clinically apparent recurrence since 2000. CHADSVasc score is low. Anticoagulants not indicated.   OSA (obstructive sleep apnea)  CPAP compliant  Obesity (BMI 30.0-34.9)  He remains mildly obese with a body mass index that is just over 30. His pants size is dramatically less. His target should be a waistline of 34 in. I don't think his cardiac problems precluded the use of anorexiant drugs, but this should be done cautiously, with supervision for development of arrhythmia or other cardiac problems.   HTN His blood pressure today is borderline high, but hopefully will be better with weight loss and continued treatment for OSA.  Murmur Is a new finding. Suggest an aortic valve obstructive  lesion. May just BE aortic valve sclerosis. Check echocardiography.    Orders Placed This Encounter  Procedures  . EKG 12-Lead  . 2D Echocardiogram without contrast   Meds ordered this encounter  Medications  . DOXYLAMINE SUCCINATE, SLEEP, PO    Sig: Take 1 tablet by mouth at bedtime.    Junious SilkROITORU,Angell Pincock  Demitrious Mccannon, MD, [facility] [phone] office [phone] pager

## 2013-11-25 NOTE — Patient Instructions (Signed)
Your physician has requested that you have an echocardiogram. Echocardiography is a painless test that uses sound waves to create images of your heart. It provides your doctor with information about the size and shape of your heart and how well your heart's chambers and valves are working. This procedure takes approximately one hour. There are no restrictions for this procedure.  Your physician recommends that you schedule a follow-up appointment in: 12 months with Dr.Croitoru

## 2013-12-02 ENCOUNTER — Telehealth: Payer: Self-pay | Admitting: Cardiovascular Disease

## 2013-12-02 NOTE — Telephone Encounter (Signed)
Please call,pt is going to donate  blood on Wednesday. Having an echo on Thursday,will donating the blood effect his echo?

## 2013-12-02 NOTE — Telephone Encounter (Signed)
Spoke with pt, okay given for pt to give blood

## 2013-12-05 ENCOUNTER — Ambulatory Visit (HOSPITAL_COMMUNITY)
Admission: RE | Admit: 2013-12-05 | Discharge: 2013-12-05 | Disposition: A | Discharge status: Home / Self Care | Payer: BC Managed Care – PPO | Source: Ambulatory Visit | Attending: Cardiovascular Disease | Admitting: Cardiovascular Disease

## 2013-12-05 DIAGNOSIS — I519 Heart disease, unspecified: Secondary | ICD-10-CM

## 2013-12-05 DIAGNOSIS — R011 Cardiac murmur, unspecified: Secondary | ICD-10-CM | POA: Insufficient documentation

## 2013-12-05 NOTE — Progress Notes (Signed)
2D Echo Performed 12/05/2013    Christna Kulick, RCS  

## 2014-01-22 ENCOUNTER — Ambulatory Visit (INDEPENDENT_AMBULATORY_CARE_PROVIDER_SITE_OTHER): Payer: BC Managed Care – PPO | Admitting: Cardiovascular Disease

## 2014-01-22 VITALS — BP 159/101 | HR 68 | Ht 69.0 in | Wt 211.1 lb

## 2014-01-22 DIAGNOSIS — I10 Essential (primary) hypertension: Secondary | ICD-10-CM

## 2014-01-22 DIAGNOSIS — N200 Calculus of kidney: Secondary | ICD-10-CM

## 2014-01-22 DIAGNOSIS — G4733 Obstructive sleep apnea (adult) (pediatric): Secondary | ICD-10-CM

## 2014-01-22 DIAGNOSIS — E669 Obesity, unspecified: Secondary | ICD-10-CM

## 2014-01-22 NOTE — Patient Instructions (Addendum)
Your physician recommends that you schedule a follow-up appointment in: PRN with Dr. Tresa Endo

## 2014-01-26 ENCOUNTER — Encounter: Payer: Self-pay | Admitting: Cardiovascular Disease

## 2014-01-26 NOTE — Progress Notes (Addendum)
Patient ID: Luis Mccann, male   DOB: [birth year], 58 y.o.   MRN: [id number]     HPI: Luis Mccann is a 58 y.o. male who is followed by Dr. Royann Shivers for his cardiology care.  He presents for sleep clinic evaluation following initiation of CPAP therapy for obstructive sleep apnea.  Luis Mccann is a 58 year old male who has a history of paroxysmal atrial fibrillation, nephrolithiasis, and hypertension.  He has undergone nasal surgery by Dr. Lazarus Salines.  In May 2015 he was referred for a diagnostic polysomnogram.  At that time, he has significant hypersomnolence.  His Epworth scale indoors to 14.  There was evidence for loud snoring, witnessed apnea, and nonrestorative sleep.  He typically would go to bed at 11 PM and awakens at 6 AM.  His sleep study was a split night protocol.  The baseline portion of the study severe sleep apnea was demonstrated with an AHI of 86.4.  REM sleep was not achievable.  His oxygen saturation decreased to 84% with non-REM sleep.  He was started on CPAP therapy, and that and it was titrated up to 8 cm water pressure.  He also was noted to have periodic lid movements with a significant index of 70 per hour with 13.6 per hour leading to arousal.  A download was obtained on the CPAP unit from 12/21/2013 through 01/19/2014.  He is 100% compliant with these use and usage greater than 4 hours was 77% of the days.  He was averaging 5 hours and 9 minutes of use at a fixed CPAP pressure of 9 cm.  His AHI was improved from baseline, but moderate sleep apnea, was still demonstrated with AHI of 15.1, and an apnea index of 13.3.  However, with further questioning, oftentimes, the patient has the mask off and it is still blowing, suggesting that some of his elevated AHI may be artifactual due to leak.  He has been using a [employer] FX nasal pillow medium mask.  Since initiating CPAP therapy, he does have more energy.  His hypersomnolence has improved.  He is not aware of bruxism.  He is unaware of  painful restless legs.  An Epworth scale was recalculated today and this is significantly improved as noted below.   Epworth Sleepiness Scale: Situation   Chance of Dozing/Sleeping (0 = never , 1 = slight chance , 2 = moderate chance , 3 = high chance )   sitting and reading 0   watching TV 0   sitting inactive in a public place 0   being a passenger in a motor vehicle for an hour or more 0   lying down in the afternoon 0   sitting and talking to someone 0   sitting quietly after lunch (no alcohol) 1   while stopped for a few minutes in traffic as the driver 0   Total Score  1    Past Medical History  Diagnosis Date  . Arthritis     osteoarthritis  . Obesity (BMI 30.0-34.9) 04/04/2013  . Hypertension   . Dysrhythmia 2000    single episode of afib with RVR with normal echo and rare atrial/ventricular ectopy on f/u Holter ([facility])  . Sleep apnea     do not wear mask/  . Pneumonia     hx of age 60  . History of kidney stones     Past Surgical History  Procedure Laterality Date  . Joint replacement  1995    right hip  . Colonoscopy    .  Lithotripsy      x4  . Kidney stone surgery    . Nasal septum surgery  1980's  . Achilles tendon repair Left 1987 and 1990  . Shoulder surgery Left 1990's    for burr  . Shoulder surgery Right 2010, 2012    abuttment and rotator cuff repair  . Nasal septoplasty w/ turbinoplasty Bilateral 01/02/2013    Procedure: NASAL SEPTOPLASTY WITH TURBINATE REDUCTION;  Surgeon: Flo Shanks, MD;  Location: [facility] OR;  Service: ENT;  Laterality: Bilateral;  . Hip surgery Right     arthoscopic x1, one other surgery   . Incision and drainage hip Right 09/24/2013    Procedure: RIGHT HIP BEARING SURFACE REVISION;  Surgeon: Loanne Drilling, MD;  Location: [facility] ORS;  Service: Orthopedics;  Laterality: Right;    No Known Allergies  Current Outpatient Prescriptions  Medication Sig Dispense Refill  . amLODipine (NORVASC) 10 MG tablet Take 10 mg by mouth every  morning.      Marland Kitchen DOXYLAMINE SUCCINATE, SLEEP, PO Take 1 tablet by mouth at bedtime.      . Ferrous Fumarate (IRON) 18 MG TBCR Take 1 tablet by mouth daily.      . phentermine 37.5 MG capsule Take 37.5 mg by mouth every morning.      . valsartan-hydrochlorothiazide (DIOVAN-HCT) 320-25 MG per tablet Take 1 tablet by mouth every morning.       No current facility-administered medications for this visit.    History   Social History  . Marital Status: Married    Spouse Name: N/A    Number of Children: N/A  . Years of Education: N/A   Occupational History  . Not on file.   Social History Main Topics  . Smoking status: Former Smoker    Types: Cigarettes  . Smokeless tobacco: Never Used     Comment: quit 30 years ago-social smoker  . Alcohol Use: Yes     Comment: occasional  . Drug Use: No  . Sexual Activity: Not on file   Other Topics Concern  . Not on file   Social History Narrative  . No narrative on file     ROS General: Negative; No fevers, chills, or night sweats HEENT: Status post nasal surgery by Dr. Valeta Harms; No changes in vision or hearing, sinus congestion, difficulty swallowing Pulmonary: Negative; No cough, wheezing, shortness of breath, hemoptysis Cardiovascular: Negative; No chest pain, presyncope, syncope, palpatations GI: Negative; No nausea, vomiting, diarrhea, or abdominal pain GU: History of nephrolithiasis, currently asymptomatic; No dysuria, hematuria, or difficulty voiding Musculoskeletal: Negative; no myalgias, joint pain, or weakness Hematologic: Negative; no easy bruising, bleeding Endocrine: Negative; no heat/cold intolerance Neuro: Negative; no changes in balance, headaches Skin: Negative; No rashes or skin lesions Psychiatric: Negative; No behavioral problems, depression Sleep: Positive for sleep apnea; improved daytime sleepiness and hypersomnolence, no bruxism, denies painful restless legs, no hypnogognic hallucinations, no  cataplexy   Physical Exam BP 159/101  Pulse 68  Ht  (1.753 m)  Wt 211 lb 1.6 oz (95.754 kg)  BMI 31.16 kg/m2  Repeat blood pressure by me 156/94 General: Alert, oriented, no distress.  Skin: normal turgor, no rashes HEENT: Normocephalic, atraumatic. Pupils round and reactive; sclera anicteric; extraocular muscles intact; Fundi without hemorrhages or exudates Nose without nasal septal hypertrophy Mouth/Parynx benign; Mallinpatti scale 3 Neck: No JVD, no carotid briuts Lungs: clear to ausculatation and percussion; no wheezing or rales  Chest wall: No tenderness to palpation Heart: RRR, s1 s2 normal .  Lower 6 systolic murmur.  No S3, gallop and no diastolic murmur. Abdomen: soft, nontender; no hepatosplenomehaly, BS+; abdominal aorta nontender and not dilated by palpation. Back: No CVA tenderness Pulses 2+ Extremities: no clubbinbg cyanosis or edema, Homan's sign negative  Neurologic: grossly nonfocal; cranial nerves intact. Psychological: Normal affect and mood.   LABS:  BMET    Component Value Date/Time   NA 139 09/25/2013 0427   K 4.5 09/25/2013 0427   CL 100 09/25/2013 0427   CO2 29 09/25/2013 0427   GLUCOSE 135* 09/25/2013 0427   BUN 17 09/25/2013 0427   CREATININE 1.04 09/25/2013 0427   CALCIUM 10.4 09/25/2013 0427   GFRNONAA 77* 09/25/2013 0427   GFRAA 89* 09/25/2013 0427     Hepatic Function Panel     Component Value Date/Time   PROT 6.9 09/17/2013 1000   ALBUMIN 4.0 09/17/2013 1000   AST 20 09/17/2013 1000   ALT 20 09/17/2013 1000   ALKPHOS 46 09/17/2013 1000   BILITOT 0.5 09/17/2013 1000     CBC    Component Value Date/Time   WBC 9.2 09/25/2013 0427   RBC 4.54 09/25/2013 0427   HGB 12.9* 09/25/2013 0427   HCT 37.2* 09/25/2013 0427   PLT 211 09/25/2013 0427   MCV 81.9 09/25/2013 0427   MCH 28.4 09/25/2013 0427   MCHC 34.7 09/25/2013 0427   RDW 12.4 09/25/2013 0427     BNP No results found for this basename: probnp    Lipid Panel  No results found for  this basename: chol, trig, hdl, cholhdl, vldl, ldlcalc, ldldirect     RADIOLOGY: No results found.    ASSESSMENT AND PLAN: Luis Mccann was referred for a sleep study, which confirmed severe obstructive sleep apnea with a AHI of 86.4/hr on the baseline portion of a split night protocol.  He had abnormal sleep architecture and was unable to achieve REM sleep.  He has been using CPAP therapy with benefit.  I  discussed the importance of treatment of his sleep apnea particularly with reference to his history of paroxysmal atrial fibrillation and his hypertension.  His AHI is improved, but remains elevated, but this may also be somewhat artifactual particularly since oftentimes the mask is off and is still blowing. I am changing his setting to an AutoSet mode rather than a fixed set pressure.  He has a new ResMed here since 10 AutoSet unit.  His blood pressure today is elevated.  I suspect medication adjustment will be necessary.  He is currently taking amlodipine 10 mg and valsartan HCT 320/25.  I recommended a followup with Dr. Royann Shivers for his blood pressure.  We discussed the importance of weight loss.  We also discussed the adverse consequences of untreated sleep apnea with reference to increase cardiovascular morbidity.  He is always been using his unit 5 hours and 9 minutes per night.  We discussed the importance of increased sleep duration.  On the baseline portion of his diagnostic study, he did have significantly increased periodic leg movements.  This may improve with continued CPAP therapy.  If he does note painful restless legs, and the urge to move, particularly during early portion of the evening a trial of requip may be considered.  A follow-up download will be obtained with his AutoSet mode and adjustment will be made if, necessary.  I will see him in the office as needed from sleep perspective.  Time spent: 30 min   Lennette Bihari, MD, [facility]  01/26/2014 8:51 AM

## 2014-03-30 ENCOUNTER — Other Ambulatory Visit: Payer: Self-pay | Admitting: Cardiovascular Disease

## 2014-03-31 NOTE — Telephone Encounter (Signed)
Rx was sent to pharmacy electronically. 

## 2014-04-02 ENCOUNTER — Other Ambulatory Visit: Payer: Self-pay | Admitting: Cardiovascular Disease

## 2014-04-03 NOTE — Telephone Encounter (Signed)
Rx was sent to pharmacy electronically. 

## 2014-06-19 ENCOUNTER — Telehealth: Payer: Self-pay | Admitting: Cardiovascular Disease

## 2014-06-19 NOTE — Telephone Encounter (Signed)
Spoke with pt, he noticed right after starting to use the CPAP he will wake up with his hands tingling, like they are asleep. It goes away after getting up. He talked to the people at the company and they had never heard of this being related to the use of CPAP. He wanted to know if dr Tresa Endokelly had ever heard of this. [facility].

## 2014-06-19 NOTE — Telephone Encounter (Signed)
Pt is using a C Pap.He says he  is experiencing tingling in his hands and they are going to sleep. He says this usually happen early in the morning.

## 2014-06-22 NOTE — Telephone Encounter (Signed)
Should not be due to CPAP; may be due to the was he is sleeping with pressure at his elbows

## 2014-06-24 NOTE — Telephone Encounter (Signed)
Left message of dr [facility] In[facility]elly's recommendations.

## 2014-09-15 ENCOUNTER — Ambulatory Visit: Payer: Self-pay | Admitting: Dietician

## 2014-10-30 ENCOUNTER — Telehealth: Payer: Self-pay | Admitting: Cardiovascular Disease

## 2014-10-30 NOTE — Telephone Encounter (Signed)
Pt just wanted you to know that [facility] will be sending refills for his medicine.He needs his medicine,if he need appt please let him know.

## 2014-10-31 ENCOUNTER — Other Ambulatory Visit: Payer: Self-pay | Admitting: *Deleted

## 2014-10-31 MED ORDER — VALSARTAN-HYDROCHLOROTHIAZIDE 320-25 MG PO TABS: 1.0000 | ORAL_TABLET | Freq: Every day | ORAL | Status: DC

## 2014-10-31 MED ORDER — AMLODIPINE BESYLATE 10 MG PO TABS: 10.0000 mg | ORAL_TABLET | Freq: Every morning | ORAL | Status: DC

## 2014-10-31 NOTE — Telephone Encounter (Signed)
Rx(s) sent to pharmacy electronically. Patient scheduled to see Dr. Salena Saner 02/05/15 @ 11am

## 2015-02-05 ENCOUNTER — Ambulatory Visit: Payer: Self-pay | Admitting: Cardiovascular Disease

## 2015-02-06 ENCOUNTER — Other Ambulatory Visit: Payer: Self-pay | Admitting: Cardiovascular Disease

## 2015-02-19 ENCOUNTER — Ambulatory Visit (INDEPENDENT_AMBULATORY_CARE_PROVIDER_SITE_OTHER): Payer: 59 | Admitting: Cardiovascular Disease

## 2015-02-19 ENCOUNTER — Encounter: Payer: Self-pay | Admitting: Cardiovascular Disease

## 2015-02-19 VITALS — BP 152/84 | HR 61 | Ht 69.0 in | Wt 205.0 lb

## 2015-02-19 DIAGNOSIS — I10 Essential (primary) hypertension: Secondary | ICD-10-CM | POA: Diagnosis not present

## 2015-02-19 DIAGNOSIS — G4733 Obstructive sleep apnea (adult) (pediatric): Secondary | ICD-10-CM

## 2015-02-19 DIAGNOSIS — I480 Paroxysmal atrial fibrillation: Secondary | ICD-10-CM | POA: Diagnosis not present

## 2015-02-19 NOTE — Patient Instructions (Signed)
Dr. Croitoru recommends that you schedule a follow-up appointment in: ONE YEAR   

## 2015-02-21 NOTE — Progress Notes (Signed)
Patient ID: Luis Mccann, male   DOB: 04/19/1956, 59 y.o.   MRN: [id number]     Cardiology Office Note   Date:  02/21/2015   ID:  [name], DOB 21-Jul-1955, MRN [id number]  PCP:  Neldon Labella, MD  Cardiologist:   Thurmon Fair, MD   Chief Complaint  Patient presents with  . Annual Exam      History of Present Illness: Luis Mccann is a 59 y.o. male who presents for  History of paroxysmal atrial fibrillation, obstructive sleep apnea, essential hypertension   [name] has not had any new major health problems since his last appointment. He has not had spells of palpitations to suggest recurrent atrial fibrillation. He is compliant with CPAP therapy for sleep apnea. Recently his blood pressure has been high. He blames this on the fact that he is suffering from 2 major stressors. He recently lost his job (about a week ago. He was let go after new management took over his company. His mother-in-law is also very ill. He has not initially 7 pounds over the last year and is now only borderline obese. His latest lipid profile is excellent.    Past Medical History  Diagnosis Date  . Arthritis     osteoarthritis  . Obesity (BMI 30.0-34.9) 04/04/2013  . Hypertension   . Dysrhythmia 2000    single episode of afib with RVR with normal echo and rare atrial/ventricular ectopy on f/u Holter (SEHV)  . Severe obstructive sleep apnea     do not wear mask/  . Pneumonia     hx of age 15  . History of kidney stones     Past Surgical History  Procedure Laterality Date  . Joint replacement  1995    right hip  . Colonoscopy    . Lithotripsy      x4  . Kidney stone surgery    . Nasal septum surgery  1980's  . Achilles tendon repair Left 1987 and 1990  . Shoulder surgery Left 1990's    for burr  . Shoulder surgery Right 2010, 2012    abuttment and rotator cuff repair  . Nasal septoplasty w/ turbinoplasty Bilateral 01/02/2013    Procedure: NASAL SEPTOPLASTY WITH TURBINATE REDUCTION;   Surgeon: Flo Shanks, MD;  Location: [facility] OR;  Service: ENT;  Laterality: Bilateral;  . Hip surgery Right     arthoscopic x1, one other surgery   . Incision and drainage hip Right 09/24/2013    Procedure: RIGHT HIP BEARING SURFACE REVISION;  Surgeon: Loanne Drilling, MD;  Location: [facility] ORS;  Service: Orthopedics;  Laterality: Right;  . Sleep study  10/25/2011    AHI during total sleep time - 88.39/hr, AHI during REM 75.00/hr  . Renal artery duplex  09/22/2004    abdominal aorta-normal taper with no suggestion of aneurysmal dilatation/diameter reduction. normal patency of right and left kidney arteries. right and left kidneys equal in size, symmetrical in shape with normal cortex and medulla with normal resistance indices and normal echogencity with no evidence of hydronephrosis.  . Doppler echocardiography  06/12/1998    normal doppler examination, normal overall LV systolic function     Current Outpatient Prescriptions  Medication Sig Dispense Refill  . amLODipine (NORVASC) 10 MG tablet Take 1 tablet by mouth  every morning 90 tablet 0  . Ascorbic Acid (VITAMIN C) 1000 MG tablet Take 1,000 mg by mouth daily.    . Ferrous Fumarate (IRON) 18 MG TBCR Take 1 tablet by mouth  daily.    Marland Kitchen HYDROcodone-acetaminophen (NORCO/VICODIN) 5-325 MG tablet TAKE 1 TO 2 TABLETS BY MOUTH EVERY DAY AS NEEDED FOR PAIN  0  . Multiple Vitamins-Minerals (MULTIVITAMIN ADULT PO) Take by mouth.    . traMADol (ULTRAM) 50 MG tablet Take 50 mg by mouth every 6 (six) hours as needed.    . valsartan-hydrochlorothiazide (DIOVAN-HCT) 320-25 MG tablet Take 1 tablet by mouth  daily 90 tablet 0   No current facility-administered medications for this visit.    Allergies:   Review of patient's allergies indicates no known allergies.    Social History:  The patient  reports that he has quit smoking. His smoking use included Cigarettes. He has never used smokeless tobacco. He reports that he drinks alcohol. He reports that he does  not use illicit drugs.   Family History:  The patient's family history includes Cancer (age of onset: 73) in his sister.    ROS:  Please see the history of present illness.    Otherwise, review of systems positive for none.   All other systems are reviewed and negative.    PHYSICAL EXAM: VS:  BP 152/84 mmHg  Pulse 61  Ht  (1.753 m)  Wt 205 lb (92.987 kg)  BMI 30.26 kg/m2 , BMI Body mass index is 30.26 kg/(m^2).  blood pressure recheck 141/88 mmHg General: Alert, oriented x3, no distress Head: no evidence of trauma, PERRL, EOMI, no exophtalmos or lid lag, no myxedema, no xanthelasma; normal ears, nose and oropharynx Neck: normal jugular venous pulsations and no hepatojugular reflux; brisk carotid pulses without delay and no carotid bruits Chest: clear to auscultation, no signs of consolidation by percussion or palpation, normal fremitus, symmetrical and full respiratory excursions Cardiovascular: normal position and quality of the apical impulse, regular rhythm, normal first and second heart sounds, no  murmurs, rubs or gallops Abdomen: no tenderness or distention, no masses by palpation, no abnormal pulsatility or arterial bruits, normal bowel sounds, no hepatosplenomegaly Extremities: no clubbing, cyanosis or edema; 2+ radial, ulnar and brachial pulses bilaterally; 2+ right femoral, posterior tibial and dorsalis pedis pulses; 2+ left femoral, posterior tibial and dorsalis pedis pulses; no subclavian or femoral bruits Neurological: grossly nonfocal Psych: euthymic mood, full affect   EKG:  EKG is ordered today. The ekg ordered today demonstrates  Normal sinus rhythm, incomplete left bundle branch block, QRS 118 ms, QTC 406 ms   Recent Labs: No results found for requested labs within last 365 days.    Lipid Panel No results found for: CHOL, TRIG, HDL, CHOLHDL, VLDL, LDLCALC, LDLDIRECT    Wt Readings from Last 3 Encounters:  02/19/15 205 lb (92.987 kg)  01/22/14 211 lb  1.6 oz (95.754 kg)  11/25/13 212 lb 8 oz (96.389 kg)      ASSESSMENT AND PLAN:  1.  Remote history of paroxysmal atrial fibrillation without recurrence. CHADSVasc score 1, on ASA. 2.  Essential hypertension with fair control 3.  Borderline obesity -  Encouraged to continue efforts at weight loss 4.  Obstructive sleep apnea compliant with CPAP    Current medicines are reviewed at length with the patient today.  The patient does not have concerns regarding medicines.  The following changes have been made:  no change  Labs/ tests ordered today include:  No orders of the defined types were placed in this encounter.    Patient Instructions  Dr. Royann Shivers recommends that you schedule a follow-up appointment in: ONE YEAR.     Joie Bimler, MD  02/21/2015 9:06 PM    Thurmon FairMihai Elanor Cale, MD, [facility] [phone] office [phone] pager

## 2015-03-03 ENCOUNTER — Encounter: Payer: Self-pay | Admitting: Cardiovascular Disease

## 2015-05-18 ENCOUNTER — Other Ambulatory Visit: Payer: Self-pay | Admitting: *Deleted

## 2015-05-18 MED ORDER — AMLODIPINE BESYLATE 10 MG PO TABS
10.0000 mg | ORAL_TABLET | Freq: Every day | ORAL | Status: DC
Start: 2015-05-18 — End: 2015-11-19

## 2015-05-18 MED ORDER — VALSARTAN-HYDROCHLOROTHIAZIDE 320-25 MG PO TABS: 1.0000 | ORAL_TABLET | Freq: Every day | ORAL | Status: DC

## 2015-09-30 ENCOUNTER — Telehealth: Payer: Self-pay | Admitting: Cardiovascular Disease

## 2015-09-30 NOTE — Telephone Encounter (Signed)
PATIENT CALLED .  Patient states his PCP Dr Hyacinth MeekerMiller would for him start a statin for preventative purpose. Patient would like Dr Royann Shiversroitoru opinion - since Dr C.follows blood pressure ,per patient Patient did not know which statin  Dr Hyacinth MeekerMiller wanted to start.   RN informed patient that did sound reasonably,the lab results are not present ,  RN called to have results faxed -will call and let patient know that results are present   Will defer to Dr Royann ShiversROITORU to review and contact patient

## 2015-09-30 NOTE — Telephone Encounter (Signed)
I'm not sure I understand. He had an excellent (very low) LDL cholesterol on his last labs from May 22. As far as I can tell, he does not need a statin.

## 2015-09-30 NOTE — Telephone Encounter (Signed)
New Message  Pt spoke w/ PCP today about adding a statin to his meds. Pt wanted to discuss w/ RN. Please call back and discuss.

## 2015-09-30 NOTE — Telephone Encounter (Signed)
Left message for patient   patient would like to use [phone] cell number   received lab results from Dr Rondel BatonMiller's office- placed to be scan

## 2015-10-01 NOTE — Telephone Encounter (Signed)
LEFT MESSAGE TO CALL BACK

## 2015-10-02 NOTE — Telephone Encounter (Signed)
NO ANSWER , PHONE RANG X 5

## 2015-10-05 ENCOUNTER — Encounter: Payer: Self-pay | Admitting: *Deleted

## 2015-10-05 ENCOUNTER — Telehealth: Payer: Self-pay | Admitting: Cardiovascular Disease

## 2015-10-05 NOTE — Telephone Encounter (Signed)
Unable to speak to patient left several message. Mailed letter with information

## 2015-10-05 NOTE — Telephone Encounter (Signed)
Informed patient   results - statin not needed Verbalized understanding.

## 2015-10-05 NOTE — Telephone Encounter (Signed)
New message ° ° ° ° °Returning a call to the nurse °

## 2015-11-19 ENCOUNTER — Other Ambulatory Visit: Payer: Self-pay | Admitting: *Deleted

## 2015-11-19 MED ORDER — AMLODIPINE BESYLATE 10 MG PO TABS: 10.0000 mg | ORAL_TABLET | Freq: Every day | ORAL | Status: DC

## 2015-11-19 MED ORDER — VALSARTAN-HYDROCHLOROTHIAZIDE 320-25 MG PO TABS: 1.0000 | ORAL_TABLET | Freq: Every day | ORAL | Status: DC

## 2015-11-27 ENCOUNTER — Telehealth: Payer: Self-pay | Admitting: Cardiovascular Disease

## 2015-11-27 NOTE — Telephone Encounter (Signed)
Please review for any advisements on neurontin dosing.

## 2015-11-27 NOTE — Telephone Encounter (Signed)
New message   Pt orthopedics is going to prescribe him Neurntin for nerve pain and he wants to now if it is okay for him to take with his other drugs

## 2015-11-27 NOTE — Telephone Encounter (Signed)
Recommend to avoid hydrocodone products with gabapentin (Neurontin), can increase CNS depressive effects.  Fine with his cardiac meds

## 2015-11-30 NOTE — Telephone Encounter (Signed)
Left detailed msg on cell phone VM w/ advice.

## 2015-12-14 ENCOUNTER — Telehealth: Payer: Self-pay | Admitting: Cardiovascular Disease

## 2015-12-14 NOTE — Telephone Encounter (Signed)
Patient notified of MD advice. Voiced understanding.  

## 2015-12-14 NOTE — Telephone Encounter (Signed)
Patient would like to know if OK to use Lidocaine patch.  Deferred to Dr. Salena Saner & clinical pharmacy staff to review

## 2015-12-14 NOTE — Telephone Encounter (Signed)
New message       Pt wants to know if the doc thinks it's ok to use a Lanacane patch that is 5% on his shoulder for pain. Please call.

## 2015-12-14 NOTE — Telephone Encounter (Signed)
Okay to use lidocaine patch

## 2015-12-17 ENCOUNTER — Telehealth: Payer: Self-pay | Admitting: Cardiovascular Disease

## 2015-12-17 NOTE — Telephone Encounter (Signed)
Returned call to patient.He stated he has pain in left arm from a torn rotary cuff and wanted to take lidocaine patch for pain.Stated PCP wanted him to take NTG patch 0.1% instead.Stated he would like Dr.Croitoru's opinion he knows NTG is for your heart and he wants to make sure this is safe.Also said orthopedic Dr.prescribed Gabapentin 300 mg three times a day.He is afraid to take that may cause drowsiness.Message sent to Dr.Croitoru.

## 2015-12-17 NOTE — Telephone Encounter (Signed)
Returned call to patient Dr.Croitoru's recommendation given. 

## 2015-12-17 NOTE — Telephone Encounter (Signed)
New message        Pt wants to know how the doctor feels about a nitroglycerin patch .01% on his arm for pain. Does the doc for see any problems with that? Please call.

## 2015-12-17 NOTE — Telephone Encounter (Signed)
I have read about some reports of using local nitroglycerin patches for rotator cuff related pain, although it seems to be a controversial use. It is quite likely the patch could give a headache, less likely that would reduce his blood pressure a little more than his regular medications. He does not have a contraindication to the use of nitroglycerin. As far as the gabapentin is concerned ,that is indeed a relatively high dose and I would have him discuss with his orthopedic doctor whether he could start at a lower dose first, to make sure he won't have excessive drowsiness.

## 2016-02-24 ENCOUNTER — Ambulatory Visit (INDEPENDENT_AMBULATORY_CARE_PROVIDER_SITE_OTHER): Payer: BC Managed Care – PPO | Admitting: Cardiovascular Disease

## 2016-02-24 ENCOUNTER — Encounter: Payer: Self-pay | Admitting: Cardiovascular Disease

## 2016-02-24 VITALS — BP 130/88 | HR 50 | Ht 69.0 in | Wt 204.2 lb

## 2016-02-24 DIAGNOSIS — I480 Paroxysmal atrial fibrillation: Secondary | ICD-10-CM

## 2016-02-24 DIAGNOSIS — E66811 Obesity, class 1: Secondary | ICD-10-CM

## 2016-02-24 DIAGNOSIS — G4733 Obstructive sleep apnea (adult) (pediatric): Secondary | ICD-10-CM

## 2016-02-24 DIAGNOSIS — E669 Obesity, unspecified: Secondary | ICD-10-CM | POA: Diagnosis not present

## 2016-02-24 DIAGNOSIS — I10 Essential (primary) hypertension: Secondary | ICD-10-CM

## 2016-02-24 NOTE — Progress Notes (Signed)
Cardiology Office Note    Date:  02/24/2016   ID:  Luis Mccann, DOB 11-May-1955, MRN [id number]  PCP:  Neldon LabellaMILLER,LISA LYNN, MD  Cardiologist:   Thurmon FairMihai Camillia Marcy, MD   Chief Complaint  Patient presents with  . Follow-up    History of Present Illness:  Luis Mccann is a 60 y.o. male with remote history of a single episode of atrial fibrillation, OSA on CPAP, essential hypertension, borderline obesity, but without known structural heart disease returns for follow-up.  She generally feels well with the exception of complaints of soreness in his joints after exercising. He still has a tough time dealing psychologically with a loss of his job and the financial income it associated. His mother-in-law passed away about 6 months ago. Overall he feels better after switching CPAP to automatic settings and gets better rest. He denies syncope, excessive daytime sleepiness, palpitations, focal neurological deficits, leg edema, claudication, shortness of breath or chest pain. He tries to exercise as often as possible, but often has to take "days off" due to joint complaints.   Past Medical History:  Diagnosis Date  . Arthritis    osteoarthritis  . Dysrhythmia 2000   single episode of afib with RVR with normal echo and rare atrial/ventricular ectopy on f/u Holter (SEHV)  . History of kidney stones   . Hypertension   . Obesity (BMI 30.0-34.9) 04/04/2013  . Pneumonia    hx of age 56  . Severe obstructive sleep apnea    do not wear mask/    Past Surgical History:  Procedure Laterality Date  . ACHILLES TENDON REPAIR Left 1987 and 1990  . COLONOSCOPY    . DOPPLER ECHOCARDIOGRAPHY  06/12/1998   normal doppler examination, normal overall LV systolic function  . HIP SURGERY Right    arthoscopic x1, one other surgery   . INCISION AND DRAINAGE HIP Right 09/24/2013   Procedure: RIGHT HIP BEARING SURFACE REVISION;  Surgeon: Loanne DrillingFrank Aluisio V, MD;  Location: [facility] OR[facility];  Service: Orthopedics;  Laterality: Right;    . JOINT REPLACEMENT  1995   right hip  . KIDNEY STONE SURGERY    . LITHOTRIPSY     x4  . NASAL SEPTOPLASTY W/ TURBINOPLASTY Bilateral 01/02/2013   Procedure: NASAL SEPTOPLASTY WITH TURBINATE REDUCTION;  Surgeon: Flo ShanksKarol Wolicki, MD;  [facility];  Service: ENT;  Laterality: Bilateral;  . NASAL SEPTUM SURGERY  1980's  . RENAL ARTERY DUPLEX  09/22/2004   abdominal aorta-normal taper with no suggestion of aneurysmal dilatation/diameter reduction. normal patency of right and left kidney arteries. right and left kidneys equal in size, symmetrical in shape with normal cortex and medulla with normal resistance indices and normal echogencity with no evidence of hydronephrosis.  Marland Kitchen. SHOULDER SURGERY Left 1990's   for burr  . SHOULDER SURGERY Right 2010, 2012   abuttment and rotator cuff repair  . SLEEP STUDY  10/25/2011   AHI during total sleep time - 88.39/hr, AHI during REM 75.00/hr    Current Medications: Outpatient Medications Prior to Visit  Medication Sig Dispense Refill  . HYDROcodone-acetaminophen (NORCO/VICODIN) 5-325 MG tablet TAKE 1 TO 2 TABLETS BY MOUTH EVERY DAY AS NEEDED FOR PAIN  0  . Multiple Vitamins-Minerals (MULTIVITAMIN ADULT PO) Take by mouth.    . traMADol (ULTRAM) 50 MG tablet Take 50 mg by mouth as needed.     Marland Kitchen. amLODipine (NORVASC) 10 MG tablet Take 1 tablet (10 mg total) by mouth daily. 90 tablet 3  . Ascorbic Acid (VITAMIN  C) 1000 MG tablet Take 1,000 mg by mouth daily.    . valsartan-hydrochlorothiazide (DIOVAN-HCT) 320-25 MG tablet Take 1 tablet by mouth daily. 90 tablet 1  . Ferrous Fumarate (IRON) 18 MG TBCR Take 1 tablet by mouth daily.     No facility-administered medications prior to visit.      Allergies:   Review of patient's allergies indicates no known allergies.   Social History   Social History  . Marital status: Married    Spouse name: N/A  . Number of children: N/A  . Years of education: N/A   Social History Main Topics  . Smoking status:  Former Smoker    Types: Cigarettes  . Smokeless tobacco: Never Used     Comment: quit 30 years ago-social smoker  . Alcohol use Yes     Comment: occasional  . Drug use: No  . Sexual activity: Not Asked   Other Topics Concern  . None   Social History Narrative  . None     Family History:  The patient's family history includes Cancer (age of onset: 46) in his sister.   ROS:   Please see the history of present illness.    ROS All other systems reviewed and are negative.   PHYSICAL EXAM:   VS:  BP 130/88 (BP Location: Right Arm, Patient Position: Sitting, Cuff Size: Normal)   Pulse (!) 50   Ht 5\' 9"  (1.753 m)   Wt 204 lb 3.2 oz (92.6 kg)   BMI 30.16 kg/m    GEN: Well nourished, well developed, in no acute distress  HEENT: normal  Neck: no JVD, carotid bruits, or masses Cardiac: RRR; no murmurs, rubs, or gallops,no edema  Respiratory:  clear to auscultation bilaterally, normal work of breathing GI: soft, nontender, nondistended, + BS MS: no deformity or atrophy  Skin: warm and dry, no rash Neuro:  Alert and Oriented x 3, Strength and sensation are intact Psych: euthymic mood, full affect  Wt Readings from Last 3 Encounters:  02/24/16 204 lb 3.2 oz (92.6 kg)  02/19/15 205 lb (93 kg)  01/22/14 211 lb 1.6 oz (95.8 kg)      Studies/Labs Reviewed:   EKG:  EKG is ordered today.  The ekg ordered today demonstrates Sinus bradycardia, incomplete right bundle branch block with QRS 110 ms, QTC 401 ms  Recent Labs: No results found for requested labs within last 8760 hours.    ASSESSMENT:    1. Paroxysmal atrial fibrillation (HCC)   2. Essential hypertension   3. OSA (obstructive sleep apnea)   4. Obesity (BMI 30.0-34.9)      PLAN:  In order of problems listed above:  1. AFib: Letrell only had one episode of atrial fibrillation that occurred more than 15 years ago and has not been documented since. His embolic risk is low and he takes only aspirin.Marland KitchenCHADSVasc 1 (HTN).  Baseline bradycardia precludes use of beta blockers. 2. HTN: Well controlled 3. OSA: Compliant with CPAP with symptomatic improvement 4. Obesity: Mild, discussed ways to lose weight above and beyond exercise. Especially reviewing dietary changes such as restriction of sweets and carbohydrates with high glycemic index, increased intake of high protein and increased intake of unsaturated fat.    Medication Adjustments/Labs and Tests Ordered: Current medicines are reviewed at length with the patient today.  Concerns regarding medicines are outlined above.  Medication changes, Labs and Tests ordered today are listed in the Patient Instructions below. Patient Instructions  Dr Royann Shivers recommends that you schedule  a follow-up appointment in 1 year. You will receive a reminder letter in the mail two months in advance. If you don't receive a letter, please call our office to schedule the follow-up appointment.  If you need a refill on your cardiac medications before your next appointment, please call your pharmacy.    Signed, Thurmon Fair, MD  02/24/2016 5:01 PM    [facility] [address] [place], [place], [place]  [address] Phone: [phone]; Fax: [phone]

## 2016-02-24 NOTE — Patient Instructions (Signed)
Dr Croitoru recommends that you schedule a follow-up appointment in 1 year. You will receive a reminder letter in the mail two months in advance. If you don't receive a letter, please call our office to schedule the follow-up appointment.  If you need a refill on your cardiac medications before your next appointment, please call your pharmacy. 

## 2016-05-13 ENCOUNTER — Telehealth: Payer: Self-pay | Admitting: Cardiovascular Disease

## 2016-05-13 NOTE — Telephone Encounter (Signed)
FORWARD TO  WANDA, CHELLEY

## 2016-05-13 NOTE — Telephone Encounter (Signed)
New message     FYI Calling to let the doctor know that [facility] will fax over an order for approval for CPAP head gear. If we do not get the order within the next couple of days, please let pt know.

## 2016-05-30 ENCOUNTER — Telehealth: Payer: Self-pay | Admitting: Cardiovascular Disease

## 2016-05-30 MED ORDER — VALSARTAN-HYDROCHLOROTHIAZIDE 320-25 MG PO TABS: 1.0000 | ORAL_TABLET | Freq: Every day | ORAL | 2 refills | Status: DC

## 2016-05-30 NOTE — Telephone Encounter (Signed)
Prescription e- sent  To [facility] - left message on patient voicemail if need to be sent somewhere else please call and let us know.     still working on [facility]  - prescription for head gear for C-PAP

## 2016-05-30 NOTE — Telephone Encounter (Signed)
Please be on the look out for his Valsartan refill,they said they were sending it on Saturday.Also did Dr C receive a request from [facility] for a prescription for a C-Pap head gear?

## 2016-05-31 ENCOUNTER — Other Ambulatory Visit: Payer: Self-pay

## 2016-05-31 MED ORDER — VALSARTAN-HYDROCHLOROTHIAZIDE 320-25 MG PO TABS: 1.0000 | ORAL_TABLET | Freq: Every day | ORAL | 2 refills | Status: DC

## 2016-06-03 ENCOUNTER — Telehealth: Payer: Self-pay | Admitting: *Deleted

## 2016-06-03 NOTE — Telephone Encounter (Signed)
Please advise 

## 2016-06-03 NOTE — Telephone Encounter (Signed)
Faxed to [facility]

## 2016-06-03 NOTE — Telephone Encounter (Signed)
Patient left a msg on the refill vm stating that he had spoke with someone from the office earlier today and was informed that his orders for cpap supplies had been sent to [facility]. Per patient [facility] does not have the orders. He stated that he has been working on this for a few weeks and would like to get it resolved. Thanks, MI

## 2016-06-09 NOTE — Telephone Encounter (Signed)
refaxed  Orders previously sent to [facility] by Chelley Truitt.

## 2016-11-25 ENCOUNTER — Telehealth: Payer: Self-pay | Admitting: Cardiovascular Disease

## 2016-11-25 ENCOUNTER — Telehealth: Payer: Self-pay

## 2016-11-25 MED ORDER — OLMESARTAN MEDOXOMIL-HCTZ 40-25 MG PO TABS: 1.0000 | ORAL_TABLET | Freq: Every day | ORAL | 1 refills | Status: DC

## 2016-11-25 MED ORDER — AMLODIPINE BESYLATE 10 MG PO TABS: 10.0000 mg | ORAL_TABLET | Freq: Every day | ORAL | 0 refills | Status: DC

## 2016-11-25 NOTE — Telephone Encounter (Signed)
[facility]  [facility]. Patient has home BP cuff, will take BP at home every 2-3 days for 2 weeks to ensure no significant changes in BP. Pt  will call with any BP changes -or- s/e for further direction. Refill sent to pharmacy as requested. He will call back with BP and request RF for his mail order pharmacy

## 2016-11-25 NOTE — Telephone Encounter (Signed)
Rx has been sent to the pharmacy electronically. ° °

## 2016-11-25 NOTE — Telephone Encounter (Signed)
New Message   *STAT* If patient is at the pharmacy, call can be transferred to refill team.   1. Which medications need to be refilled? (please list name of each medication and dose if known) Amlodipine 10mg    2. Which pharmacy/location (including street and city if local pharmacy) is medication to be sent to? [facility]  3. Do they need a 30 day or 90 day supply? 90

## 2016-11-25 NOTE — Telephone Encounter (Signed)
S/w pt he is asking if he was wondering if he needs to have a follow up appt, he states that he has not been seen by Dr Tresa EndoKelly since seeing when CPAP was ordered in 2015. He states that they do the electronic monitoring, he is asking if the machine need to be adjusted will this happen? Or does he need to make an appt for this? He thinks that it may need some adjusting and the company states that he is getting 4 hours sleep with the machine on and that is all he needs. Is this true? Please advise

## 2016-11-25 NOTE — Telephone Encounter (Signed)
New Message  Pt c/o medication issue:  1. Name of Medication: Valsartan  2. How are you currently taking this medication (dosage and times per day)? 320-25mg    3. Are you having a reaction (difficulty breathing--STAT)? No   4. What is your medication issue? Recall  Pt call to put in a refill for is medication and will need a refill on the alternative medication being prescribed. Pt would like to discuss his refill for his medication to be at the same time, instead of going to the pharmacy more than once. Please call back to discuss

## 2016-11-27 ENCOUNTER — Encounter: Payer: Self-pay | Admitting: Cardiovascular Disease

## 2016-11-27 NOTE — Telephone Encounter (Signed)
Obtain a Nurse, adultdownload.  He needs more that 4 hrs of cpap use daily for optimal benefit. Can arrange a f/u appointment to assess further or make adjustments post download obtained.

## 2016-11-28 NOTE — Telephone Encounter (Signed)
download completed, will have Dr. Tresa EndoKelly review.

## 2016-12-06 ENCOUNTER — Telehealth: Payer: Self-pay | Admitting: *Deleted

## 2016-12-06 ENCOUNTER — Encounter: Payer: Self-pay | Admitting: *Deleted

## 2016-12-06 NOTE — Telephone Encounter (Signed)
This encounter was created in error - please disregard.

## 2016-12-06 NOTE — Telephone Encounter (Signed)
Attempt to call patient regarding recent CPAP download.  Left message to call back.   Dr. Tresa EndoKelly reviewed, no changes needed at this time, will see on a as needed basis.

## 2016-12-13 NOTE — Telephone Encounter (Signed)
Returned call-left detailed message (ok per DPR).   Advised of Dr. Tresa EndoKelly recommendations after reviewing CPAP download.   No changes recommended at this time, follow up as needed with Dr. Tresa EndoKelly.    Advised to call with further questions or concerns.

## 2016-12-13 NOTE — Telephone Encounter (Signed)
Follow up      Returning a call to the nurse to get CPAP follow up

## 2016-12-20 ENCOUNTER — Telehealth: Payer: Self-pay | Admitting: Cardiovascular Disease

## 2016-12-20 NOTE — Telephone Encounter (Signed)
Patient does not have any complaints at this time.

## 2016-12-20 NOTE — Telephone Encounter (Signed)
New message    Patient is calling to report his BP readings.  8/15-152/94  8/16-145/89 8/17-164/97 8/20-167/100 8/21-159/100

## 2016-12-21 NOTE — Telephone Encounter (Signed)
Those readings are consistently too high. Please confirm that he is still taking olmesartan - HCTZ  40/25 and amlodipine 10 daily. Has this home BP monitor been checked against an office device? If the answer to both is yes, please make him [facility] appointment with me or HTN clinic first available.

## 2016-12-22 ENCOUNTER — Telehealth: Payer: Self-pay | Admitting: Cardiovascular Disease

## 2016-12-22 DIAGNOSIS — Z79899 Other long term (current) drug therapy: Secondary | ICD-10-CM

## 2016-12-22 NOTE — Telephone Encounter (Signed)
Returning call.

## 2016-12-22 NOTE — Telephone Encounter (Signed)
Left message for pt to call.

## 2016-12-22 NOTE — Telephone Encounter (Signed)
Left message to call back  

## 2016-12-22 NOTE — Telephone Encounter (Signed)
There are no straightforward additional medications we can just add on. He is already on maximum usual doses of the 3 agents that he is taking. Many medications that we commonly for blood pressure cannot be used since he has had problems with slow heartbeats in the past. Okay to try spironolactone 25 mg once daily. This will not affect his heart rate. The full effect on his blood pressure will "kick in" in about 2 weeks. We'll need to recheck blood tests for kidney function and potassium level after his been on it for about a month. MCr

## 2016-12-22 NOTE — Telephone Encounter (Signed)
Returned the call to the patient to inform him of Dr. Erin Hearing recommendation. The patient bought a new blood pressure monitor, [employer], and he is taking the medication listed below.    Those readings are consistently too high. Please confirm that he is still taking olmesartan - HCTZ  40/25 and amlodipine 10 daily. Has this home BP monitor been checked against an office device? If the answer to both is yes, please make him [facility] appointment with me or HTN clinic first available.  The patient stated that he does not want to come in for an appointment because it costs him $85 per visit. He would rather have a new medication called in for him and doesn't understand the need for him to have to come in for the appointment . Quite a bit of time was spent trying to explain to the patient the need to come in for an assessment and blood pressure check but he declined. He stated that he will call his PCP and get their advice. He was informed to call back if he changed his mind and we would be happy to make that appointment for him. He verbalized his understanding.

## 2016-12-22 NOTE — Telephone Encounter (Signed)
Returned the call to the patient. There was no answer or any voicemail. Will try back tomorrow.

## 2016-12-23 MED ORDER — SPIRONOLACTONE 25 MG PO TABS: 25.0000 mg | ORAL_TABLET | Freq: Every day | ORAL | 3 refills | Status: DC

## 2016-12-23 MED ORDER — OLMESARTAN MEDOXOMIL-HCTZ 40-25 MG PO TABS: 1.0000 | ORAL_TABLET | Freq: Every day | ORAL | 3 refills | Status: DC

## 2016-12-23 NOTE — Telephone Encounter (Signed)
Patient has been called and informed to start spironolactone 25 mg daily and to return to have his blood drawn in a month to check his kidney function and potassium level. He has also been instructed to keep checking his blood pressures and to keep a log of the readings. He should bring this with him to his next appointment on 10/29 with Dr. Royann Shivers. He verbalized his understanding and appreciation.

## 2016-12-30 ENCOUNTER — Ambulatory Visit (INDEPENDENT_AMBULATORY_CARE_PROVIDER_SITE_OTHER): Payer: BC Managed Care – PPO | Admitting: Podiatry

## 2016-12-30 ENCOUNTER — Other Ambulatory Visit: Payer: Self-pay | Admitting: Podiatry

## 2016-12-30 ENCOUNTER — Encounter: Payer: Self-pay | Admitting: Podiatry

## 2016-12-30 ENCOUNTER — Ambulatory Visit (INDEPENDENT_AMBULATORY_CARE_PROVIDER_SITE_OTHER): Payer: BC Managed Care – PPO

## 2016-12-30 VITALS — BP 165/101 | HR 59

## 2016-12-30 DIAGNOSIS — M2040 Other hammer toe(s) (acquired), unspecified foot: Secondary | ICD-10-CM | POA: Diagnosis not present

## 2016-12-30 DIAGNOSIS — M79673 Pain in unspecified foot: Secondary | ICD-10-CM | POA: Diagnosis not present

## 2016-12-30 NOTE — Progress Notes (Signed)
   Subjective:    Patient ID: Luis Mccann, male    DOB: [birth year], 61 y.o.   MRN: [id number]  HPI  Chief Complaint  Patient presents with  . Toes curling       Review of Systems  Musculoskeletal: Positive for arthralgias and back pain.  All other systems reviewed and are negative.      Objective:   Physical Exam        Assessment & Plan:

## 2016-12-30 NOTE — Progress Notes (Signed)
Subjective:    Patient ID: Luis Mccann, male   DOB: 61 y.o.   MRN: [id number]   HPI patient states that he had tendon released in the past of his digits and he's having trouble with the fourth and fifth toes on the right foot and he feels like that they're creating pain when he tries to walk on them. States it's been gradually more of an issue and becomes more relevant    Review of Systems  All other systems reviewed and are negative.       Objective:  Physical Exam  Constitutional: He appears well-developed and well-nourished.  Cardiovascular: Intact distal pulses.   Pulmonary/Chest: Effort normal.  Musculoskeletal: Normal range of motion.  Neurological: He is alert.  Skin: Skin is warm.  Nursing note and vitals reviewed.  neurovascular status intact muscle strength adequate range of motion within normal limits with patient found to have significant rotation of the lesser digits right over left fourth and fifth toes with rotated digits and what appears to be walking on the ends of the toes. The left shows some of the same issues but not to the same degree and they are not painful     Assessment:   Hammertoe of the distal joints digits 45 right over left with rotated component and discomfort with most likely the pain due to walking on them. Negative for neuroma symptomatology currently upon testing      Plan:    H&P condition reviewed and explained. Today I went ahead and I discussed treatment options and he is already had tenotomy is already had buttress padding and padding and I do think distal arthroplasties of the fourth and fifth toes right would be the best alternative even though I absolutely explaining there is no guarantee this would solve his problem. Patient does want to undergo this treatment and is can decide what's best for his schedule and we'll call for consultation for procedure  X-rays indicate that the patient's fourth and fifth toes right and left foot a rotated  with previous tenotomy which does not appear to be contributory to problem

## 2017-02-21 ENCOUNTER — Telehealth: Payer: Self-pay | Admitting: Cardiovascular Disease

## 2017-02-21 NOTE — Telephone Encounter (Signed)
New Message  Pt c/o medication issue:  1. Name of Medication: Diclofenac- SOD-EC 75mg  and Meloxicam   2. How are you currently taking this medication (dosage and times per day)?   3. Are you having a reaction (difficulty breathing--STAT)? No per pt is not taking this medication   4. What is your medication issue? Per pt would like to know if it would be okay for him to take this medication with the side effects. Please callback to discuss

## 2017-02-21 NOTE — Telephone Encounter (Signed)
Returned call to patient no answer.Left message on personal voice mail I will send message to Dr.Croitoru for advice.

## 2017-02-22 NOTE — Telephone Encounter (Signed)
Taking either one of those medications can increase the BP and may harm kidney function, especially if taken long term. Not an absolute contraindication though. If he is to take them on a daily basis, please record BP and send us a log after about 2 weeks on the meds. Would also recheck kidney function (BMET) in about 1-2 months. MCr

## 2017-02-23 NOTE — Telephone Encounter (Signed)
Returned call to patient Dr.Croitoru's recommendations given.He stated if he decides to take he will follow up B/P and bmet with PCP.

## 2017-02-27 ENCOUNTER — Encounter: Payer: Self-pay | Admitting: Cardiovascular Disease

## 2017-02-27 ENCOUNTER — Ambulatory Visit (INDEPENDENT_AMBULATORY_CARE_PROVIDER_SITE_OTHER): Payer: BC Managed Care – PPO | Admitting: Cardiovascular Disease

## 2017-02-27 VITALS — BP 140/74 | HR 54 | Ht 69.0 in | Wt 202.0 lb

## 2017-02-27 DIAGNOSIS — I10 Essential (primary) hypertension: Secondary | ICD-10-CM | POA: Diagnosis not present

## 2017-02-27 DIAGNOSIS — I480 Paroxysmal atrial fibrillation: Secondary | ICD-10-CM | POA: Diagnosis not present

## 2017-02-27 DIAGNOSIS — M2550 Pain in unspecified joint: Secondary | ICD-10-CM | POA: Insufficient documentation

## 2017-02-27 DIAGNOSIS — M25551 Pain in right hip: Secondary | ICD-10-CM

## 2017-02-27 DIAGNOSIS — Z79899 Other long term (current) drug therapy: Secondary | ICD-10-CM

## 2017-02-27 DIAGNOSIS — G4733 Obstructive sleep apnea (adult) (pediatric): Secondary | ICD-10-CM

## 2017-02-27 MED ORDER — AMLODIPINE BESYLATE 10 MG PO TABS: 10.0000 mg | ORAL_TABLET | Freq: Every day | ORAL | 0 refills | Status: DC

## 2017-02-27 NOTE — Patient Instructions (Signed)
Dr Royann Shiversroitoru recommends that you schedule a follow-up appointment in 12 months. You will receive a reminder letter in the mail two months in advance. If you don't receive a letter, please call our office to schedule the follow-up appointment.  If you need a refill on your cardiac medications before your next appointment, please call your pharmacy.  Once you start arthritis medications, please monitor your blood pressure daily. Please use the same machine to check your blood pressure daily. Keep a record of your blood pressures using the log sheet provided. In 2 weeks, please report your readings back to Dr C. You may use our online patient portal 'MyChart' or you can call the office to speak with a nurse.  1 month after starting arthritis medications, please have blood work done.

## 2017-02-27 NOTE — Addendum Note (Signed)
Addended by: Kandice RobinsonsYOUNG, Shauntea Lok T on: 02/27/2017 03:09 PM   Modules accepted: Orders

## 2017-02-27 NOTE — Progress Notes (Signed)
Cardiology Office Note    Date:  02/27/2017   ID:  Luis Mccann, DOB [birth year], MRN [id number]  PCP:  Sigmund Hazel, MD  Cardiologist:   Thurmon Fair, MD   No chief complaint on file.   History of Present Illness:  Luis Mccann is a 61 y.o. male with remote history of a single episode of atrial fibrillation, OSA on CPAP, essential hypertension, borderline obesity, but without known structural heart disease returns for follow-up.  He has a lot of joint problems.  He complains of pain in his right shoulder where he has had a previous subacromial resection rotator cuff repair, his right hip which has been replaced twice, lumbar spine where he has a known problem with L4, both knees.  Inquires about the safety of taking long-acting NSAIDs such as diclofenac or meloxicam.  His blood pressure has been difficult to control and he is on maximum doses of amlodipine and ARB as well as thiazide diuretic and aldosterone antagonist.  The patient specifically denies any chest pain at rest exertion, dyspnea at rest or with exertion, orthopnea, paroxysmal nocturnal dyspnea, syncope, palpitations, focal neurological deficits, intermittent claudication, lower extremity edema, unexplained weight gain, cough, hemoptysis or wheezing.  Compliant with CPAP. He denies excessive daytime sleepiness.  Past Medical History:  Diagnosis Date  . Arthritis    osteoarthritis  . Dysrhythmia 2000   single episode of afib with RVR with normal echo and rare atrial/ventricular ectopy on f/u Holter ([facility])  . History of kidney stones   . Hypertension   . Obesity (BMI 30.0-34.9) 04/04/2013  . Pneumonia    hx of age 24  . Severe obstructive sleep apnea    do not wear mask/    Past Surgical History:  Procedure Laterality Date  . ACHILLES TENDON REPAIR Left 1987 and 1990  . COLONOSCOPY    . DOPPLER ECHOCARDIOGRAPHY  06/12/1998   normal doppler examination, normal overall LV systolic function  . HIP SURGERY Right    arthoscopic x1, one other surgery   . INCISION AND DRAINAGE HIP Right 09/24/2013   Procedure: RIGHT HIP BEARING SURFACE REVISION;  Surgeon: Loanne Drilling, MD;  Location: [facility] ORS;  Service: Orthopedics;  Laterality: Right;  . JOINT REPLACEMENT  1995   right hip  . KIDNEY STONE SURGERY    . LITHOTRIPSY     x4  . NASAL SEPTOPLASTY W/ TURBINOPLASTY Bilateral 01/02/2013   Procedure: NASAL SEPTOPLASTY WITH TURBINATE REDUCTION;  Surgeon: Flo Shanks, MD;  Location: [facility] OR;  Service: ENT;  Laterality: Bilateral;  . NASAL SEPTUM SURGERY  1980's  . RENAL ARTERY DUPLEX  09/22/2004   abdominal aorta-normal taper with no suggestion of aneurysmal dilatation/diameter reduction. normal patency of right and left kidney arteries. right and left kidneys equal in size, symmetrical in shape with normal cortex and medulla with normal resistance indices and normal echogencity with no evidence of hydronephrosis.  Marland Kitchen SHOULDER SURGERY Left 1990's   for burr  . SHOULDER SURGERY Right 2010, 2012   abuttment and rotator cuff repair  . SLEEP STUDY  10/25/2011   AHI during total sleep time - 88.39/hr, AHI during REM 75.00/hr    Current Medications: Outpatient Medications Prior to Visit  Medication Sig Dispense Refill  . amLODipine (NORVASC) 10 MG tablet Take 1 tablet (10 mg total) by mouth daily. 90 tablet 0  . Ascorbic Acid (VITAMIN C) 1000 MG tablet Take 1,000 mg by mouth daily.    . Ferrous Sulfate (IRON) 28 MG TABS  Take 1 tablet by mouth. Monday Wednesday Friday    . ibuprofen (ADVIL,MOTRIN) 800 MG tablet     . Multiple Vitamins-Minerals (MULTIVITAMIN ADULT PO) Take by mouth.    . olmesartan-hydrochlorothiazide (BENICAR HCT) 40-25 MG tablet Take 1 tablet by mouth daily. 90 tablet 3  . spironolactone (ALDACTONE) 25 MG tablet Take 1 tablet (25 mg total) by mouth daily. 90 tablet 3  . traMADol (ULTRAM) 50 MG tablet Take 50 mg by mouth as needed.      No facility-administered medications prior to visit.       Allergies:   Patient has no known allergies.   Social History   Social History  . Marital status: Married    Spouse name: N/A  . Number of children: N/A  . Years of education: N/A   Social History Main Topics  . Smoking status: Former Smoker    Types: Cigarettes  . Smokeless tobacco: Never Used     Comment: quit 30 years ago-social smoker  . Alcohol use Yes     Comment: occasional  . Drug use: No  . Sexual activity: Not Asked   Other Topics Concern  . None   Social History Narrative  . None     Family History:  The patient's family history includes Cancer (age of onset: 40) in his sister.   ROS:   Please see the history of present illness.    ROS All other systems reviewed and are negative.   PHYSICAL EXAM:   VS:  BP 140/74   Pulse (!) 54   Ht 5\' 9"  (1.753 m)   Wt 202 lb (91.6 kg)   BMI 29.83 kg/m     General: Alert, oriented x3, no distress Head: no evidence of trauma, PERRL, EOMI, no exophtalmos or lid lag, no myxedema, no xanthelasma; normal ears, nose and oropharynx Neck: normal jugular venous pulsations and no hepatojugular reflux; brisk carotid pulses without delay and no carotid bruits Chest: clear to auscultation, no signs of consolidation by percussion or palpation, normal fremitus, symmetrical and full respiratory excursions Cardiovascular: normal position and quality of the apical impulse, regular rhythm, normal first and second heart sounds, no murmurs, rubs or gallops Abdomen: no tenderness or distention, no masses by palpation, no abnormal pulsatility or arterial bruits, normal bowel sounds, no hepatosplenomegaly Extremities: no clubbing, cyanosis or edema; 2+ radial, ulnar and brachial pulses bilaterally; 2+ right femoral, posterior tibial and dorsalis pedis pulses; 2+ left femoral, posterior tibial and dorsalis pedis pulses; no subclavian or femoral bruits Neurological: grossly nonfocal Psych: Normal mood and affect   Wt Readings from Last 3  Encounters:  02/27/17 202 lb (91.6 kg)  02/24/16 204 lb 3.2 oz (92.6 kg)  02/19/15 205 lb (93 kg)      Studies/Labs Reviewed:   EKG:  EKG is ordered today.  The ekg ordered today demonstrates sinus bradycardia, minor nonspecific IVCD with sharp Q waves in the inferior leads seconds Recent Labs: No results found for requested labs within last 8760 hours.    ASSESSMENT:    1. Paroxysmal atrial fibrillation (HCC)   2. Medication management   3. Essential hypertension   4. OSA (obstructive sleep apnea)   5. Pain of right hip joint      PLAN:  In order of problems listed above:  1. AFib: Not had atrial fibrillation since the initial episode 15 years ago.  Embolic risk is low. CHADSVasc 1 (HTN). Baseline bradycardia precludes use of beta blockers.  Anticoagulation is even less  appealing if he is to start NSAIDs. 2. HTN: Usually better controlled than today, requires 4 different agents. 3. OSA: Compliant with CPAP.  He has also lost weight and is no longer obese.  4. Overweight: Progress with weight loss, but still needs to lose additional weight 5. DJD: we reviewed the fact that NSAIDs can worsen kidney function and raise blood pressure.  Today are not absolutely contraindicated but we need to keep a close eye on his blood pressure, renal function and potassium levels.  Keep a daily log of blood pressure check labs within a month of starting the medication.  He has concerned that he has inflammation "all over".  His complaints can be explained by previous trauma or surgery to particular joints, but could consider a rheumatology evaluation.    Medication Adjustments/Labs and Tests Ordered: Current medicines are reviewed at length with the patient today.  Concerns regarding medicines are outlined above.  Medication changes, Labs and Tests ordered today are listed in the Patient Instructions below. Patient Instructions  Dr Royann Shiversroitoru recommends that you schedule a follow-up appointment in  12 months. You will receive a reminder letter in the mail two months in advance. If you don't receive a letter, please call our office to schedule the follow-up appointment.  If you need a refill on your cardiac medications before your next appointment, please call your pharmacy.  Once you start arthritis medications, please monitor your blood pressure daily. Please use the same machine to check your blood pressure daily. Keep a record of your blood pressures using the log sheet provided. In 2 weeks, please report your readings back to Dr C. You may use our online patient portal 'MyChart' or you can call the office to speak with a nurse.  1 month after starting arthritis medications, please have blood work done.    Signed, Thurmon FairMihai Cristiana Yochim, MD  02/27/2017 3:03 PM    [facility] [address], [place], [place]  [address] Phone: [phone]; Fax: [phone]

## 2017-05-17 ENCOUNTER — Other Ambulatory Visit: Payer: Self-pay | Admitting: Cardiovascular Disease

## 2017-05-17 NOTE — Telephone Encounter (Signed)
REFILL 

## 2017-06-05 ENCOUNTER — Ambulatory Visit (INDEPENDENT_AMBULATORY_CARE_PROVIDER_SITE_OTHER): Payer: BC Managed Care – PPO

## 2017-06-05 ENCOUNTER — Encounter: Payer: Self-pay | Admitting: Podiatry

## 2017-06-05 ENCOUNTER — Ambulatory Visit: Payer: BC Managed Care – PPO | Admitting: Podiatry

## 2017-06-05 DIAGNOSIS — M2040 Other hammer toe(s) (acquired), unspecified foot: Secondary | ICD-10-CM | POA: Diagnosis not present

## 2017-06-06 ENCOUNTER — Telehealth: Payer: Self-pay

## 2017-06-06 NOTE — Telephone Encounter (Signed)
I had a message from [place] Liberty[place] and I'm returning her call. I saw Dr. Charlsie Merlesegal yesterday. My phone number is [phone]. Thank you.

## 2017-06-06 NOTE — Telephone Encounter (Signed)
LVM as a follow up for after visit questions or concerns

## 2017-06-07 ENCOUNTER — Telehealth: Payer: Self-pay | Admitting: Podiatry

## 2017-06-07 NOTE — Telephone Encounter (Signed)
Hello Shanda BumpsJessica this is Inge RiseDavid Carpino again. I saw Dr. Charlsie Merlesegal a few days ago and you called to see if I had questions and this is the third message I've left. If I let Dr. Charlsie Merlesegal, say he removes the distal head I guess would a person be able to do rise and fall like they do in dance? I just wanted to know you know taking out part of the end of the toe I'm just curious to see if that is something people can't do anymore because that toe doesn't have all that bone in it. Uh please call me back at [phone]. Thank you.

## 2017-06-07 NOTE — Progress Notes (Signed)
Subjective:   Patient ID: Luis Mccann, male   DOB: 62 y.o.   MRN: [id number]   HPI Patient presents concerned about his fourth and fifth toes of his right over left foot and states he had tenotomies done in the year 17 and that he feels weaker   ROS      Objective:  Physical Exam  Neurovascular status intact with patient found to have rotation of the fourth and fifth toes of both feet with no indication currently of dysfunction of the tendon group with mild rotation noted upon gait evaluation no corn callus porokeratotic lesion formation     Assessment:  Appears to be some form of digital deformity with rotation which seems to create irritation for the patient with an absence of corn callus formation     Plan:  H&P conditions reviewed at great length and I discussed with him derotational arthroplasties to try to straighten his toes and take pressure off the bottom of his toes.  He wants assurances that this will solve his problem and I can simply not do this given the fact he has no corn callus formation and had previous tenotomies.  I did spend a great deal time going over that with him and that did not seem to satisfy him so I recommended he seek another opinion for consideration of procedures.  X-rays indicate rotation of the fourth and fifth digits at the distal interphalangeal joint bilateral

## 2017-06-07 NOTE — Telephone Encounter (Signed)
Luis BlowerHey Luis Mccann, this is Luis Mccann again. I left a message much earlier today. I did see Dr. Charlsie Merlesegal and you called me back asking me if I had all my questions answered and I find that interesting because I didn't. I know he was kind of in a hurry and he kind of apologized which I appreciated. One of the reason I did pay the $80.00 is because it was kind of like a consultation. The funny thing is I've been to more than one podiatrist and they are all like that and they all do that. They don't want to explain the procedure. Only thing I can think of is they are afraid it will scare people off if they really start talking about it. I just want to understand exactly the total outcome so I can decide wether I want to do it or not. They just don't seem to really want to get into the details and it's hard for me to say yeah go ahead and cut into my toes when I really don't understand what the final result will be. I've had a tenotomy done on my toes and I didn't know what the final result was going to be and I thought I did. Please give me a call back at [phone]. Bye.

## 2017-06-08 ENCOUNTER — Telehealth: Payer: Self-pay

## 2017-06-08 NOTE — Telephone Encounter (Signed)
LVM informing patient that he may benefit from coming in for another consultation to see Dr Charlsie Merlesegal for further answers to questions regarding surgery. I am only able to answer certain questions, the questions that he has would most likely need to be answered by Dr Charlsie Merlesegal himself since he was performing the surgery. I advised him that he was more than welcome to call me back to try to discuss things further if he wanted

## 2017-06-09 ENCOUNTER — Telehealth: Payer: Self-pay | Admitting: Podiatry

## 2017-06-09 NOTE — Telephone Encounter (Signed)
This message is for the nurse Shanda BumpsJessica. I appreciate you returning my calls. You talked about me talking with Dr. Charlsie Merlesegal. I mean I would love to because I feel like he is very good obviously. I was there and I had an appointment and I paid $80.00 and from my perspective it didn't go very well. I mean he mentioned he had to get to his other patients. I don't know, I think maybe they get questioned out by the patients and they don't have time. I'm just saying you know I would really love to get his opinion on somebody who has operated on a lot of people and seen the results before I decide to do that. I mean I could decide not to. I came in for a consultation not to say yes to having surgery. I'm just trying to get to a point where I will or will not do it. Okay, thank you.

## 2017-06-09 NOTE — Telephone Encounter (Signed)
Luis Mccann I'm calling back again because I wanted to express I'm open to talk to Dr. Charlsie Merlesegal but I want to make sure that he's open to it. You know what I'm saying because I thought he got a little irritated when I was asking a lot of questions last time. I looked on the Internet but I can't imagine a better source than him. If he's open to the idea then I'm open to the idea because I feel like maybe I need to do something maybe I shouldn't but I can really decide because I don't know enough my foot to know okay. So anyway thank you.

## 2017-06-12 ENCOUNTER — Telehealth: Payer: Self-pay

## 2017-07-17 ENCOUNTER — Other Ambulatory Visit: Payer: Self-pay | Admitting: *Deleted

## 2017-07-17 MED ORDER — OLMESARTAN MEDOXOMIL-HCTZ 40-25 MG PO TABS: 1.0000 | ORAL_TABLET | Freq: Every day | ORAL | 1 refills | Status: DC

## 2017-07-17 NOTE — Telephone Encounter (Signed)
Rx has been sent to the pharmacy electronically. ° °

## 2017-07-18 DIAGNOSIS — M2041 Other hammer toe(s) (acquired), right foot: Secondary | ICD-10-CM | POA: Insufficient documentation

## 2017-08-18 DIAGNOSIS — M48061 Spinal stenosis, lumbar region without neurogenic claudication: Secondary | ICD-10-CM | POA: Insufficient documentation

## 2017-08-18 DIAGNOSIS — M419 Scoliosis, unspecified: Secondary | ICD-10-CM | POA: Insufficient documentation

## 2017-09-20 DIAGNOSIS — M25561 Pain in right knee: Secondary | ICD-10-CM | POA: Insufficient documentation

## 2017-09-21 NOTE — Telephone Encounter (Signed)
error 

## 2017-10-16 ENCOUNTER — Telehealth: Payer: Self-pay | Admitting: *Deleted

## 2017-10-16 NOTE — Telephone Encounter (Signed)
Patient walked into office stating he needed a "part" for his CPAP machine from [facility]. I called and spoke with Jasmine DecemberSharon who advised me the patient needed a water chamber. Order faxed to [facility]  for patient to recieve a water chamber.

## 2017-10-19 ENCOUNTER — Telehealth: Payer: Self-pay | Admitting: *Deleted

## 2017-10-19 NOTE — Telephone Encounter (Signed)
Faxed CPAP supply order to  [facility]. 

## 2017-11-15 ENCOUNTER — Other Ambulatory Visit: Payer: Self-pay | Admitting: Cardiovascular Disease

## 2017-12-07 ENCOUNTER — Other Ambulatory Visit: Payer: Self-pay | Admitting: Cardiovascular Disease

## 2018-01-12 DIAGNOSIS — M7062 Trochanteric bursitis, left hip: Secondary | ICD-10-CM | POA: Insufficient documentation

## 2018-01-19 ENCOUNTER — Encounter (HOSPITAL_COMMUNITY): Payer: Self-pay

## 2018-01-19 ENCOUNTER — Other Ambulatory Visit: Payer: Self-pay

## 2018-01-19 ENCOUNTER — Inpatient Hospital Stay (HOSPITAL_COMMUNITY)
Admission: EM | Admit: 2018-01-19 | Discharge: 2018-01-24 | DRG: 500 | Disposition: A | Discharge status: Home Health | Payer: BC Managed Care – PPO | Attending: Family Medicine | Admitting: Family Medicine

## 2018-01-19 ENCOUNTER — Inpatient Hospital Stay: Payer: Self-pay

## 2018-01-19 DIAGNOSIS — Z87442 Personal history of urinary calculi: Secondary | ICD-10-CM | POA: Diagnosis not present

## 2018-01-19 DIAGNOSIS — M4316 Spondylolisthesis, lumbar region: Secondary | ICD-10-CM | POA: Diagnosis present

## 2018-01-19 DIAGNOSIS — Z96641 Presence of right artificial hip joint: Secondary | ICD-10-CM | POA: Diagnosis present

## 2018-01-19 DIAGNOSIS — R262 Difficulty in walking, not elsewhere classified: Secondary | ICD-10-CM | POA: Diagnosis present

## 2018-01-19 DIAGNOSIS — M419 Scoliosis, unspecified: Secondary | ICD-10-CM | POA: Diagnosis present

## 2018-01-19 DIAGNOSIS — E669 Obesity, unspecified: Secondary | ICD-10-CM | POA: Diagnosis present

## 2018-01-19 DIAGNOSIS — I480 Paroxysmal atrial fibrillation: Secondary | ICD-10-CM | POA: Diagnosis present

## 2018-01-19 DIAGNOSIS — Z807 Family history of other malignant neoplasms of lymphoid, hematopoietic and related tissues: Secondary | ICD-10-CM

## 2018-01-19 DIAGNOSIS — M48061 Spinal stenosis, lumbar region without neurogenic claudication: Secondary | ICD-10-CM | POA: Diagnosis present

## 2018-01-19 DIAGNOSIS — Z79891 Long term (current) use of opiate analgesic: Secondary | ICD-10-CM | POA: Diagnosis not present

## 2018-01-19 DIAGNOSIS — M8618 Other acute osteomyelitis, other site: Secondary | ICD-10-CM | POA: Diagnosis not present

## 2018-01-19 DIAGNOSIS — M6088 Other myositis, other site: Secondary | ICD-10-CM | POA: Diagnosis present

## 2018-01-19 DIAGNOSIS — G8929 Other chronic pain: Secondary | ICD-10-CM | POA: Diagnosis present

## 2018-01-19 DIAGNOSIS — Z79899 Other long term (current) drug therapy: Secondary | ICD-10-CM | POA: Diagnosis not present

## 2018-01-19 DIAGNOSIS — G4733 Obstructive sleep apnea (adult) (pediatric): Secondary | ICD-10-CM | POA: Diagnosis present

## 2018-01-19 DIAGNOSIS — Z6830 Body mass index (BMI) 30.0-30.9, adult: Secondary | ICD-10-CM | POA: Diagnosis not present

## 2018-01-19 DIAGNOSIS — M4626 Osteomyelitis of vertebra, lumbar region: Principal | ICD-10-CM | POA: Diagnosis present

## 2018-01-19 DIAGNOSIS — B348 Other viral infections of unspecified site: Secondary | ICD-10-CM | POA: Diagnosis present

## 2018-01-19 DIAGNOSIS — Z791 Long term (current) use of non-steroidal anti-inflammatories (NSAID): Secondary | ICD-10-CM

## 2018-01-19 DIAGNOSIS — M1990 Unspecified osteoarthritis, unspecified site: Secondary | ICD-10-CM | POA: Diagnosis present

## 2018-01-19 DIAGNOSIS — G061 Intraspinal abscess and granuloma: Secondary | ICD-10-CM | POA: Diagnosis not present

## 2018-01-19 DIAGNOSIS — K6812 Psoas muscle abscess: Secondary | ICD-10-CM | POA: Diagnosis present

## 2018-01-19 DIAGNOSIS — B963 Hemophilus influenzae [H. influenzae] as the cause of diseases classified elsewhere: Secondary | ICD-10-CM | POA: Diagnosis not present

## 2018-01-19 DIAGNOSIS — Z87891 Personal history of nicotine dependence: Secondary | ICD-10-CM

## 2018-01-19 DIAGNOSIS — M4640 Discitis, unspecified, site unspecified: Secondary | ICD-10-CM | POA: Diagnosis present

## 2018-01-19 DIAGNOSIS — I10 Essential (primary) hypertension: Secondary | ICD-10-CM | POA: Diagnosis present

## 2018-01-19 DIAGNOSIS — M4646 Discitis, unspecified, lumbar region: Secondary | ICD-10-CM

## 2018-01-19 DIAGNOSIS — M869 Osteomyelitis, unspecified: Secondary | ICD-10-CM

## 2018-01-19 LAB — BASIC METABOLIC PANEL
ANION GAP: 10 (ref 5–15)
BUN: 26 mg/dL — ABNORMAL HIGH (ref 8–23)
CO2: 27 mmol/L (ref 22–32)
Calcium: 10.2 mg/dL (ref 8.9–10.3)
Chloride: 98 mmol/L (ref 98–111)
Creatinine, Ser: 1.17 mg/dL (ref 0.61–1.24)
GFR calc Af Amer: >60 mL/min (ref >60)
GFR calc non Af Amer: >60 mL/min (ref >60)
GLUCOSE: 98 mg/dL (ref 70–99)
POTASSIUM: 3.8 mmol/L (ref 3.5–5.1)
Sodium: 135 mmol/L (ref 135–145)

## 2018-01-19 LAB — CBC
HEMATOCRIT: 44.1 % (ref 39.0–52.0)
HEMOGLOBIN: 14.4 g/dL (ref 13.0–17.0)
MCH: 28.3 pg (ref 26.0–34.0)
MCHC: 32.7 g/dL (ref 30.0–36.0)
MCV: 86.8 fL (ref 78.0–100.0)
Platelets: 349 10*3/uL (ref 150–400)
RBC: 5.08 MIL/uL (ref 4.22–5.81)
RDW: 13.1 % (ref 11.5–15.5)
WBC: 10.5 10*3/uL (ref 4.0–10.5)

## 2018-01-19 LAB — C-REACTIVE PROTEIN: CRP: 9.5 mg/dL — ABNORMAL HIGH (ref <1.0)

## 2018-01-19 LAB — SEDIMENTATION RATE: Sed Rate: 60 mm/hr — ABNORMAL HIGH (ref 0–16)

## 2018-01-19 MED ORDER — TRAMADOL HCL 50 MG PO TABS
100.0000 mg | ORAL_TABLET | Freq: Three times a day (TID) | ORAL | Status: DC | PRN
  Administered 2018-01-19 – 2018-01-21 (×2): 100 mg via ORAL
  Filled 2018-01-19 (×2): qty 2

## 2018-01-19 MED ORDER — VITAMIN C 500 MG PO TABS
1000.0000 mg | ORAL_TABLET | Freq: Every day | ORAL | Status: DC
  Administered 2018-01-21 – 2018-01-24 (×4): 1000 mg via ORAL
  Filled 2018-01-19 (×5): qty 2

## 2018-01-19 MED ORDER — SPIRONOLACTONE 25 MG PO TABS
25.0000 mg | ORAL_TABLET | Freq: Every day | ORAL | Status: DC
  Administered 2018-01-20 – 2018-01-24 (×5): 25 mg via ORAL
  Filled 2018-01-19 (×5): qty 1

## 2018-01-19 MED ORDER — SODIUM CHLORIDE 0.9 % IV SOLN
INTRAVENOUS | Status: DC
  Administered 2018-01-19 – 2018-01-24 (×7): via INTRAVENOUS

## 2018-01-19 MED ORDER — HYDROCHLOROTHIAZIDE 25 MG PO TABS
25.0000 mg | ORAL_TABLET | Freq: Every day | ORAL | Status: DC
  Administered 2018-01-20 – 2018-01-24 (×5): 25 mg via ORAL
  Filled 2018-01-19 (×5): qty 1

## 2018-01-19 MED ORDER — MORPHINE SULFATE (PF) 4 MG/ML IV SOLN
3.0000 mg | INTRAVENOUS | Status: DC | PRN
  Administered 2018-01-19 (×2): 3 mg via INTRAVENOUS
  Filled 2018-01-19 (×2): qty 1

## 2018-01-19 MED ORDER — VANCOMYCIN HCL IN DEXTROSE 1-5 GM/200ML-% IV SOLN: 1000.0000 mg | Freq: Two times a day (BID) | INTRAVENOUS | Status: DC

## 2018-01-19 MED ORDER — TRAMADOL HCL 50 MG PO TABS: 100.0000 mg | ORAL_TABLET | Freq: Three times a day (TID) | ORAL | Status: DC | PRN

## 2018-01-19 MED ORDER — PIPERACILLIN-TAZOBACTAM 3.375 G IVPB
3.3750 g | Freq: Three times a day (TID) | INTRAVENOUS | Status: DC
  Filled 2018-01-19: qty 50

## 2018-01-19 MED ORDER — IRBESARTAN 300 MG PO TABS
300.0000 mg | ORAL_TABLET | Freq: Every day | ORAL | Status: DC
  Administered 2018-01-20 – 2018-01-24 (×5): 300 mg via ORAL
  Filled 2018-01-19 (×5): qty 1

## 2018-01-19 MED ORDER — OLMESARTAN MEDOXOMIL-HCTZ 40-25 MG PO TABS: 1.0000 | ORAL_TABLET | Freq: Every day | ORAL | Status: DC

## 2018-01-19 MED ORDER — GABAPENTIN 300 MG PO CAPS
300.0000 mg | ORAL_CAPSULE | Freq: Three times a day (TID) | ORAL | Status: DC
  Administered 2018-01-19 – 2018-01-24 (×14): 300 mg via ORAL
  Filled 2018-01-19 (×14): qty 1

## 2018-01-19 MED ORDER — METHOCARBAMOL 500 MG PO TABS
500.0000 mg | ORAL_TABLET | Freq: Three times a day (TID) | ORAL | Status: DC | PRN
  Administered 2018-01-19 – 2018-01-24 (×8): 500 mg via ORAL
  Filled 2018-01-19 (×9): qty 1

## 2018-01-19 MED ORDER — FERROUS SULFATE 325 (65 FE) MG PO TABS
325.0000 mg | ORAL_TABLET | ORAL | Status: DC
  Administered 2018-01-22 – 2018-01-24 (×2): 325 mg via ORAL
  Filled 2018-01-19 (×3): qty 1

## 2018-01-19 MED ORDER — MORPHINE SULFATE (PF) 4 MG/ML IV SOLN
4.0000 mg | Freq: Once | INTRAVENOUS | Status: AC
  Administered 2018-01-19: 4 mg via INTRAVENOUS
  Filled 2018-01-19: qty 1

## 2018-01-19 MED ORDER — MORPHINE SULFATE (PF) 4 MG/ML IV SOLN
4.0000 mg | INTRAVENOUS | Status: DC | PRN
  Administered 2018-01-20 – 2018-01-22 (×12): 4 mg via INTRAVENOUS
  Filled 2018-01-19 (×14): qty 1

## 2018-01-19 MED ORDER — ACETAMINOPHEN 500 MG PO TABS
500.0000 mg | ORAL_TABLET | Freq: Four times a day (QID) | ORAL | Status: DC | PRN
  Administered 2018-01-20 – 2018-01-21 (×2): 500 mg via ORAL
  Filled 2018-01-19 (×2): qty 1

## 2018-01-19 MED ORDER — AMLODIPINE BESYLATE 10 MG PO TABS
10.0000 mg | ORAL_TABLET | Freq: Every day | ORAL | Status: DC
  Administered 2018-01-20 – 2018-01-24 (×5): 10 mg via ORAL
  Filled 2018-01-19 (×5): qty 1

## 2018-01-19 MED ORDER — DICLOFENAC SODIUM 75 MG PO TBEC
75.0000 mg | DELAYED_RELEASE_TABLET | Freq: Two times a day (BID) | ORAL | Status: DC
  Administered 2018-01-20 – 2018-01-24 (×7): 75 mg via ORAL
  Filled 2018-01-19 (×11): qty 1

## 2018-01-19 MED ORDER — ADULT MULTIVITAMIN W/MINERALS CH
1.0000 | ORAL_TABLET | Freq: Every morning | ORAL | Status: DC
  Administered 2018-01-21 – 2018-01-24 (×4): 1 via ORAL
  Filled 2018-01-19 (×6): qty 1

## 2018-01-19 MED ORDER — VITAMIN A 10000 UNITS PO CAPS: 10000.0000 [IU] | ORAL_CAPSULE | Freq: Every day | ORAL | Status: DC

## 2018-01-19 MED ORDER — ENOXAPARIN SODIUM 40 MG/0.4ML ~~LOC~~ SOLN: 40.0000 mg | SUBCUTANEOUS | Status: DC

## 2018-01-19 MED ORDER — PIPERACILLIN-TAZOBACTAM 3.375 G IVPB 30 MIN
3.3750 g | Freq: Once | INTRAVENOUS | Status: AC
  Administered 2018-01-19: 3.375 g via INTRAVENOUS
  Filled 2018-01-19: qty 50

## 2018-01-19 MED ORDER — VANCOMYCIN HCL 10 G IV SOLR
1750.0000 mg | Freq: Once | INTRAVENOUS | Status: AC
  Administered 2018-01-19: 1750 mg via INTRAVENOUS
  Filled 2018-01-19: qty 1750

## 2018-01-19 NOTE — ED Provider Notes (Signed)
[facility]ERGENCY DEPARTMENT Provider Note   CSN: [id number] Arrival date & time: 01/19/18  1222     History   Chief Complaint Chief Complaint  Patient presents with  . Back Pain    HPI Luis Mccann is a 62 y.o. male.  HPI  62 year old male sent to the emergency department from the orthopedic office with MRI findings of lumbar discitis and developing abscess status post epidural injection on 5 September for worsening back and hip pain.  Reports worsening discomfort and pain in his low back not improving with epidural injection.  Called by his orthopedic surgeon Dr. Tonita Cong and told to come the emergency department for admission for IV antibiotics and additional treatment.  Patient found to have psoas abscess and L4 discitis and developing osteomyelitis mainly of the left facet joint.  No epidural spread.  Patient's pain is moderate in severity at this time.  No other complaints.  No new weakness of his legs.  Past Medical History:  Diagnosis Date  . Arthritis    osteoarthritis  . Dysrhythmia 2000   single episode of afib with RVR with normal echo and rare atrial/ventricular ectopy on f/u Holter ([facility])  . History of kidney stones   . Hypertension   . Obesity (BMI 30.0-34.9) 04/04/2013  . Pneumonia    hx of age 4  . Severe obstructive sleep apnea    do not wear mask/    Patient Active Problem List   Diagnosis Date Noted  . Arthralgia 02/27/2017  . Prosthetic wear following total hip arthroplasty ([place]) 09/24/2013  . Paroxysmal atrial fibrillation ([place]) 04/04/2013  . HTN (hypertension) 04/04/2013  . OSA (obstructive sleep apnea) 04/04/2013  . Overweight 04/04/2013  . Nephrolithiasis 04/04/2013    Past Surgical History:  Procedure Laterality Date  . ACHILLES TENDON REPAIR Left 1987 and 1990  . COLONOSCOPY    . DOPPLER ECHOCARDIOGRAPHY  06/12/1998   normal doppler examination, normal overall LV systolic function  . HIP SURGERY Right    arthoscopic  x1, one other surgery   . INCISION AND DRAINAGE HIP Right 09/24/2013   Procedure: RIGHT HIP BEARING SURFACE REVISION;  Surgeon: Gearlean Alf, MD;  Location: [facility];  Service: Orthopedics;  Laterality: Right;  . JOINT REPLACEMENT  1995   right hip  . KIDNEY STONE SURGERY    . LITHOTRIPSY     x4  . NASAL SEPTOPLASTY W/ TURBINOPLASTY Bilateral 01/02/2013   Procedure: NASAL SEPTOPLASTY WITH TURBINATE REDUCTION;  Surgeon: Jodi Marble, MD;  Location: [facility];  Service: ENT;  Laterality: Bilateral;  . NASAL SEPTUM SURGERY  1980's  . RENAL ARTERY DUPLEX  09/22/2004   abdominal aorta-normal taper with no suggestion of aneurysmal dilatation/diameter reduction. normal patency of right and left kidney arteries. right and left kidneys equal in size, symmetrical in shape with normal cortex and medulla with normal resistance indices and normal echogencity with no evidence of hydronephrosis.  Marland Kitchen SHOULDER SURGERY Left 1990's   for burr  . SHOULDER SURGERY Right 2010, 2012   abuttment and rotator cuff repair  . SLEEP STUDY  10/25/2011   AHI during total sleep time - 88.39/hr, AHI during REM 75.00/hr        Home Medications    Prior to Admission medications   Medication Sig Start Date End Date Taking? Authorizing Provider  acetaminophen (TYLENOL) 500 MG tablet Take 500 mg by mouth as needed.    [provider]  amLODipine (NORVASC) 10 MG tablet TAKE 1 TABLET  DAILY 11/15/17   Croitoru, Mihai, MD  Ascorbic Acid (VITAMIN C) 1000 MG tablet Take 1,000 mg by mouth daily.    [provider]  Ferrous Sulfate (IRON) 28 MG TABS Take 1 tablet by mouth. Monday Wednesday Friday    [provider]  Glutamine 500 MG CAPS Take 1 capsule by mouth 2 (two) times daily.    [provider]  ibuprofen (ADVIL,MOTRIN) 800 MG tablet  10/27/16   [provider]  Multiple Vitamins-Minerals (MULTIVITAMIN ADULT PO) Take by mouth.    [provider]  naproxen sodium (ALEVE) 220 MG  tablet Take 220 mg by mouth as needed.    [provider]  NON FORMULARY Renatrophin supplement Take 2 tabs twice a day    [provider]  olmesartan-hydrochlorothiazide (BENICAR HCT) 40-25 MG tablet Take 1 tablet by mouth daily. 07/17/17   Croitoru, Mihai, MD  olmesartan-hydrochlorothiazide (BENICAR HCT) 40-25 MG tablet Take 1 tablet by mouth daily. NEED OV. 12/07/17   Croitoru, Mihai, MD  spironolactone (ALDACTONE) 25 MG tablet Take 1 tablet (25 mg total) by mouth daily. NEED OV. 12/07/17   Croitoru, Mihai, MD  vitamin A 10000 UNIT capsule Take 10,000 Units by mouth daily.    [provider]    Family History Family History  Problem Relation Age of Onset  . Cancer Sister 74       Multiple myeloma    Social History Social History   Tobacco Use  . Smoking status: Former Smoker    Types: Cigarettes  . Smokeless tobacco: Never Used  . Tobacco comment: quit 30 years ago-social smoker  Substance Use Topics  . Alcohol use: Yes    Comment: occasional  . Drug use: No     Allergies   Patient has no known allergies.   Review of Systems Review of Systems  All other systems reviewed and are negative.    Physical Exam Updated Vital Signs BP 120/83 (BP Location: Right Arm)   Pulse 72   Temp 98.5 F (36.9 C) (Oral)   Ht '5\' 8"'  (1.727 m)   Wt 89.8 kg   SpO2 100%   BMI 30.11 kg/m   Physical Exam  Constitutional: He is oriented to person, place, and time. He appears well-developed and well-nourished.  HENT:  Head: Normocephalic and atraumatic.  Eyes: EOM are normal.  Neck: Normal range of motion.  Cardiovascular: Normal rate, regular rhythm and normal heart sounds.  Pulmonary/Chest: Effort normal and breath sounds normal. No respiratory distress.  Abdominal: Soft. He exhibits no distension. There is no tenderness.  Musculoskeletal: Normal range of motion.  No lower extremity weakness.  Mild L-spine tenderness without overlying skin changes.  No  thoracic tenderness  Neurological: He is alert and oriented to person, place, and time.  Skin: Skin is warm and dry.  Psychiatric: He has a normal mood and affect. Judgment normal.  Nursing note and vitals reviewed.    ED Treatments / Results  Labs (all labs ordered are listed, but only abnormal results are displayed) Labs Reviewed  CBC  BASIC METABOLIC PANEL    EKG None  Radiology No results found.  Procedures Procedures (including critical care time)  Medications Ordered in ED Medications  morphine 4 MG/ML injection 4 mg (has no administration in time range)     Initial Impression / Assessment and Plan / ED Course  I have reviewed the triage vital signs and the nursing notes.  Pertinent labs & imaging results that were available during  my care of the patient were reviewed by me and considered in my medical decision making (see chart for details).     Patient with evidence of developing discitis osteomyelitis as well as a 3.5 cm left psoas abscess.  I discussed the case with his orthopedic surgeon Dr. Tonita Cong who recommended admission to the hospitalist service with infectious disease consultation and neurosurgical involvement.  I discussed the case with Dr. Dierdre Harness who felt as though this likely could be managed by CT-guided aspirate by interventional radiology.  Patient will receive broad-spectrum antibiotics now.  Blood cultures obtained.  I spoke with Dr. Kathlene Cote of interventional radiology who reports they will likely be able to get to this tomorrow.  Patient is stable and nontoxic at this time.  No indication for emergent neurosurgical intervention.  Primary admitting team to contact neurosurgery if they need formal consultation.  Final Clinical Impressions(s) / ED Diagnoses   Final diagnoses:  Discitis of lumbar region    ED Discharge Orders    None       Jola Schmidt, MD 01/19/18 1733

## 2018-01-19 NOTE — ED Notes (Addendum)
MD Shelle IronBeane [phone] (MDs personal Cell). Sent pt for Discitis of lumbar spine. MD Beane would like patient to be admitted for an Infectious Disease Consult. MD Shelle IronBeane has patients MRI. For any further questions or to request records please contact MD Beane at his personal number.

## 2018-01-19 NOTE — ED Triage Notes (Signed)
Pt has been seen for back pain "over the years" reports that he has had worsening pain over the past 2 weeks. Dr. Jillyn HiddenBean wanted pt to come to ED to be admitted by "Infectious disease for discitis". Had spinal injection in September of this year without relief. Notes on MRI show discitis, osteomyelitis, and abscess.

## 2018-01-19 NOTE — Progress Notes (Addendum)
Pharmacy Antibiotic Note  Luis Mccann is a 62 y.o. male admitted on 01/19/2018 with lumbar discitis found on MRI at orthopedic office. He received a epidural injection for back/hip pain on 01/04/17. Pharmacy has been consulted for vancomycin and zosyn dosing. WBC 10.5, afebrile. CrCl~70  Plan: Vancomycin 1750mg  x1, then 1000mg  IV Q12H Zosyn 3.375mg  Q8H F/u LOT/de-escalation, renal function, cultures, troughs prn  Height: 5\' 8"  (172.7 cm) Weight: 198 lb (89.8 kg) IBW/kg (Calculated) : 68.4  Temp (24hrs), Avg:98.5 F (36.9 C), Min:98.5 F (36.9 C), Max:98.5 F (36.9 C)  Recent Labs  Lab 01/19/18 1357  WBC 10.5  CREATININE 1.17    Estimated Creatinine Clearance: 70.4 mL/min (by C-G formula based on SCr of 1.17 mg/dL).    No Known Allergies  Antimicrobials this admission: vanc 9/20 >>  zosyn 9/20 >>   Dose adjustments this admission:   Microbiology results: 9/20 BCx:    Thank you for allowing pharmacy to be a part of this patient's care.  Ewing Schleinolton Whiteside, PharmD PGY1 Pharmacy Resident 01/19/2018    3:28 PM

## 2018-01-19 NOTE — Progress Notes (Signed)
Pt refuse CPAP for the night, stated "that Im not doing that tonight". Pt is stable no distress or complications noted

## 2018-01-19 NOTE — H&P (Signed)
History and Physical    Luis Mccann RXV:[id number] DOB: [birth year] DOA: 01/19/2018  Referring MD/NP/PA: Dr. Dian Situ PCP: Kathyrn Lass, MD  Patient coming from: home  Chief Complaint: Lower back pain, difficulty walking, night sweats and chills.  HPI: Luis Mccann is a 62 y.o. male with a past medical history significant for paroxysmal atrial fibrillation, obstructive sleep apnea, hypertension and degenerative joint disease with chronic pain; who presented to the emergency department secondary to worsening lower back pain and difficulty walking.  Patient expressed approximately 2 to 3 weeks of discomfort in his lower back and during this time has been evaluated by Dr. Tonita Cong (orthopedic surgeon) with intended relief for his pain after applying epidural shot in his back.  Patient reports no improvement whatsoever on his symptoms, which led physician to perform an MRI of his back, demonstrating acute discitis/osteomyelitis and abscess at L3-L4 and psoas muscles.  Patient reported the pain as lightning twisting, not radiated, movement makes the pain worse and rest because just minimal improvement.  He reported associated chills but no fever.  Pain is 9-10 out of 10 at the worst in intensity.  Patient expressed chills and night sweats intermittently but denies frank fever; patient denies chest pain, shortness of breath, nausea, vomiting, abdominal pain, hematuria, dysuria, melena, hematochezia, urine/feces incontinence or retention.  In the ED blood work was unremarkable; morphine was given for pain control and interventional radiologist was contacted with anticipated plan for aspiration/culture taken on 9/21.  Past Medical/Surgical History: Past Medical History:  Diagnosis Date  . Arthritis    osteoarthritis  . Dysrhythmia 2000   single episode of afib with RVR with normal echo and rare atrial/ventricular ectopy on f/u Holter ([facility])  . History of kidney stones   . Hypertension   . Obesity (BMI  30.0-34.9) 04/04/2013  . Pneumonia    hx of age 69  . Severe obstructive sleep apnea    do not wear mask/    Past Surgical History:  Procedure Laterality Date  . ACHILLES TENDON REPAIR Left 1987 and 1990  . COLONOSCOPY    . DOPPLER ECHOCARDIOGRAPHY  06/12/1998   normal doppler examination, normal overall LV systolic function  . HIP SURGERY Right    arthoscopic x1, one other surgery   . INCISION AND DRAINAGE HIP Right 09/24/2013   Procedure: RIGHT HIP BEARING SURFACE REVISION;  Surgeon: Gearlean Alf, MD;  Location: [facility];  Service: Orthopedics;  Laterality: Right;  . JOINT REPLACEMENT  1995   right hip  . KIDNEY STONE SURGERY    . LITHOTRIPSY     x4  . NASAL SEPTOPLASTY W/ TURBINOPLASTY Bilateral 01/02/2013   Procedure: NASAL SEPTOPLASTY WITH TURBINATE REDUCTION;  Surgeon: Jodi Marble, MD;  Location: [facility];  Service: ENT;  Laterality: Bilateral;  . NASAL SEPTUM SURGERY  1980's  . RENAL ARTERY DUPLEX  09/22/2004   abdominal aorta-normal taper with no suggestion of aneurysmal dilatation/diameter reduction. normal patency of right and left kidney arteries. right and left kidneys equal in size, symmetrical in shape with normal cortex and medulla with normal resistance indices and normal echogencity with no evidence of hydronephrosis.  Marland Kitchen SHOULDER SURGERY Left 1990's   for burr  . SHOULDER SURGERY Right 2010, 2012   abuttment and rotator cuff repair  . SLEEP STUDY  10/25/2011   AHI during total sleep time - 88.39/hr, AHI during REM 75.00/hr    Social History:  reports that he has quit smoking. His smoking use included cigarettes. He has never  used smokeless tobacco. He reports that he drinks alcohol. He reports that he does not use drugs.  Allergies: No Known Allergies  Family History:  Family History  Problem Relation Age of Onset  . Cancer Sister 22       Multiple myeloma    Prior to Admission medications   Medication Sig Start Date End Date Taking? Authorizing Provider    acetaminophen (TYLENOL) 500 MG tablet Take 500 mg by mouth as needed.   Yes [provider]  amLODipine (NORVASC) 10 MG tablet TAKE 1 TABLET DAILY 11/15/17  Yes Croitoru, Mihai, MD  Ascorbic Acid (VITAMIN C) 1000 MG tablet Take 1,000 mg by mouth daily.   Yes [provider]  diclofenac (VOLTAREN) 75 MG EC tablet Take 75 mg by mouth 2 (two) times daily. 01/10/18  Yes [provider]  Ferrous Sulfate (IRON) 28 MG TABS Take 1 tablet by mouth. Monday Wednesday Friday   Yes [provider]  gabapentin (NEURONTIN) 300 MG capsule Take 300 mg by mouth 3 (three) times daily. 01/08/18  Yes [provider]  methocarbamol (ROBAXIN) 500 MG tablet Take 500 mg by mouth 3 (three) times daily as needed for pain. 01/16/18  Yes [provider]  Multiple Vitamins-Minerals (MULTIVITAMIN ADULT PO) Take by mouth.   Yes [provider]  olmesartan-hydrochlorothiazide (BENICAR HCT) 40-25 MG tablet Take 1 tablet by mouth daily. 07/17/17  Yes Croitoru, Mihai, MD  spironolactone (ALDACTONE) 25 MG tablet Take 1 tablet (25 mg total) by mouth daily. NEED OV. 12/07/17  Yes Croitoru, Mihai, MD  traMADol (ULTRAM) 50 MG tablet Take 100 mg by mouth 3 (three) times daily as needed for pain. 01/17/18  Yes [provider]  vitamin A 10000 UNIT capsule Take 10,000 Units by mouth daily.   Yes [provider]  olmesartan-hydrochlorothiazide (BENICAR HCT) 40-25 MG tablet Take 1 tablet by mouth daily. NEED OV. Patient not taking: Reported on 01/19/2018 12/07/17   Croitoru, Dani Gobble, MD    Review of Systems: Negative except as otherwise mentioned in HPI.  Physical Exam: Vitals:   01/19/18 1345 01/19/18 1400 01/19/18 1500 01/19/18 1707  BP: 125/86 117/72 134/90 (!) 146/85  Pulse: 73 66 63 64  Resp:    18  Temp:    98.5 F (36.9 C)  TempSrc:    Oral  SpO2: 99% 100% 98% 99%  Weight:      Height:       Constitutional: In mild distress secondary to lower back pain.   Currently afebrile, no chest pain, no shortness of breath, no nausea, no vomiting.   Eyes: PERRL, lids and conjunctivae normal; no icterus, no nystagmus. ENMT: Mucous membranes are moist. Posterior pharynx clear of any exudate or lesions.Normal dentition.  Neck: normal, supple, no masses, no thyromegaly Respiratory: clear to auscultation bilaterally, no wheezing, no crackles. Normal respiratory effort. No accessory muscle use.  Cardiovascular: Regular rate and rhythm, no murmurs / rubs / gallops. No extremity edema. 2+ pedal pulses. No carotid bruits.  Abdomen: no tenderness, no masses palpated. No hepatosplenomegaly. Bowel sounds positive.  Musculoskeletal: no clubbing / cyanosis. No joint deformity upper and lower extremities. Good ROM, no contractures. Normal muscle tone.  Patient reports significant pain on his lower back (left side more than right side). Skin: no rashes, lesions, ulcers. No induration Neurologic: CN 2-12 grossly intact. Sensation intact, DTR normal. Strength 3-4/5 in LOWER extremities due to poor effort and pain. Normal strength on his upper extremeties bilaterally. all 4.  Psychiatric: Normal  judgment and insight. Alert and oriented x 3. Normal mood.    Labs on Admission: I have personally reviewed the following labs and imaging studies  CBC: Recent Labs  Lab 01/19/18 1357  WBC 10.5  HGB 14.4  HCT 44.1  MCV 86.8  PLT 676   Basic Metabolic Panel: Recent Labs  Lab 01/19/18 1357  NA 135  K 3.8  CL 98  CO2 27  GLUCOSE 98  BUN 26*  CREATININE 1.17  CALCIUM 10.2   GFR: Estimated Creatinine Clearance: 70.4 mL/min (by C-G formula based on SCr of 1.17 mg/dL).  Urine analysis:    Component Value Date/Time   COLORURINE YELLOW 09/24/2013 [address] 09/24/2013 1154   LABSPEC 1.020 09/24/2013 1154   PHURINE 6.0 09/24/2013 1154   GLUCOSEU NEGATIVE 09/24/2013 1154   HGBUR TRACE (A) 09/24/2013 1154   BILIRUBINUR NEGATIVE 09/24/2013 1154    KETONESUR NEGATIVE 09/24/2013 1154   PROTEINUR NEGATIVE 09/24/2013 1154   UROBILINOGEN 0.2 09/24/2013 1154   NITRITE NEGATIVE 09/24/2013 1154   LEUKOCYTESUR TRACE (A) 09/24/2013 1154   Radiological Exams on Admission: Dg Outside Films Spine  Result Date: 01/19/2018 This examination belongs to an outside facility and is stored here for comparison purposes only.  Contact the originating outside institution for any associated report or interpretation.   EKG: none   Assessment/Plan 1-Discitis/osteomyelitis and abscess of lumbar region -Area affected as per MRI results L3-L4. -Patient experiencing night sweats and intermittent chills at home; no frank fever reported. -At this particular moment we will hold antibiotics and pain for aspiration by IR on 9/21. -Following aspiration patient can be started on vancomycin and Rocephin while waiting on culture results. -PRN analgesic therapy -Physical therapy evaluation. -Provide IV fluids and follow clinical response. -PRN antipyretics has been ordered as well.  2-essential hypertension -Well-controlled and is stable. -Continue Norvasc, Avapro, HCTZ and aldactone  3-Paroxysmal atrial fibrillation (HCC) -Rate control and is stable -Patient currently sinus rhythm. -Chronically has not been on any anticoagulation. -CHADsVASC score 1  4-OSA (obstructive sleep apnea) -Continue CPAP nightly  5-chronic back pain -continue PRN analgesics and Neurontin.   DVT prophylaxis: SCD's. Code Status: Full code Family Communication: Wife at bedside. Disposition Plan: To be determined.  Hopefully discharge back home once infection treated. Consults called: IR (Dr. Kathlene Cote with anticipated plan for CT-guided aspiration on 01/20/18).  Case was also discussed with Dr. Linus Salmons (ID) recommendations were to hold on antibiotic therapy until aspiration completed and then to start the patient on vancomycin and Rocephin.  ED discussed with Dr. Vertell Limber (neurosurgery)  who expressed no need for neurosurgery consultation at this point; IR to perform aspiration/culture as needed and ID to dilate antibiotic therapy. Admission status: Inpatient, MedSurg, LOS more than 2 midnights.   Time Spent: 70 minutes  Barton Dubois MD [facility] Pager [phone]  If 7PM-7AM, please contact night-coverage [email or web address] Password [facility]  01/19/2018, 5:16 PM

## 2018-01-20 ENCOUNTER — Inpatient Hospital Stay (HOSPITAL_COMMUNITY): Payer: BC Managed Care – PPO

## 2018-01-20 LAB — CBC
HCT: 40 % (ref 39.0–52.0)
Hemoglobin: 13.1 g/dL (ref 13.0–17.0)
MCH: 28.2 pg (ref 26.0–34.0)
MCHC: 32.8 g/dL (ref 30.0–36.0)
MCV: 86.2 fL (ref 78.0–100.0)
Platelets: 307 10*3/uL (ref 150–400)
RBC: 4.64 MIL/uL (ref 4.22–5.81)
RDW: 13 % (ref 11.5–15.5)
WBC: 8.5 10*3/uL (ref 4.0–10.5)

## 2018-01-20 LAB — BASIC METABOLIC PANEL
Anion gap: 12 (ref 5–15)
BUN: 21 mg/dL (ref 8–23)
CO2: 21 mmol/L — ABNORMAL LOW (ref 22–32)
Calcium: 9.6 mg/dL (ref 8.9–10.3)
Chloride: 103 mmol/L (ref 98–111)
Creatinine, Ser: 0.95 mg/dL (ref 0.61–1.24)
GFR calc Af Amer: >60 mL/min (ref >60)
GFR calc non Af Amer: >60 mL/min (ref >60)
Glucose, Bld: 95 mg/dL (ref 70–99)
Potassium: 4.2 mmol/L (ref 3.5–5.1)
Sodium: 136 mmol/L (ref 135–145)

## 2018-01-20 LAB — PROTIME-INR
INR: 1.09
Prothrombin Time: 14 seconds (ref 11.4–15.2)

## 2018-01-20 LAB — HIV ANTIBODY (ROUTINE TESTING W REFLEX): HIV Screen 4th Generation wRfx: NONREACTIVE

## 2018-01-20 MED ORDER — FENTANYL CITRATE (PF) 100 MCG/2ML IJ SOLN
INTRAMUSCULAR | Status: AC
  Filled 2018-01-20: qty 2

## 2018-01-20 MED ORDER — LIDOCAINE HCL 1 % IJ SOLN
INTRAMUSCULAR | Status: AC
  Filled 2018-01-20: qty 20

## 2018-01-20 MED ORDER — SODIUM CHLORIDE 0.9 % IV SOLN
2.0000 g | INTRAVENOUS | Status: DC
  Administered 2018-01-20 – 2018-01-24 (×5): 2 g via INTRAVENOUS
  Filled 2018-01-20 (×5): qty 20

## 2018-01-20 MED ORDER — MIDAZOLAM HCL 2 MG/2ML IJ SOLN
INTRAMUSCULAR | Status: AC
  Filled 2018-01-20: qty 2

## 2018-01-20 MED ORDER — MIDAZOLAM HCL 2 MG/2ML IJ SOLN
INTRAMUSCULAR | Status: AC | PRN
  Administered 2018-01-20: 1 mg via INTRAVENOUS

## 2018-01-20 MED ORDER — FENTANYL CITRATE (PF) 100 MCG/2ML IJ SOLN
INTRAMUSCULAR | Status: AC | PRN
  Administered 2018-01-20: 50 ug via INTRAVENOUS

## 2018-01-20 NOTE — Consult Note (Signed)
Chief Complaint: Patient was seen in consultation today for CT guided aspiration/possible drainage of lumbar paraspinous fluid collection Chief Complaint  Patient presents with  . Back Pain    Referring Physician(s): [place]  Supervising Physician: Aletta Edouard  Patient Status: [facility] - In-pt  History of Present Illness: Luis Mccann is a 62 yo male with  past medical history significant for paroxysmal atrial fibrillation, obstructive sleep apnea, hypertension and degenerative joint disease with chronic pain who presented to the [facility] emergency department yesterday secondary to worsening lower back pain and difficulty walking.  Patient expressed approximately 2 to 3 weeks of discomfort in his lower back and underwent epidural injection on 01/04/18 by Dr. Nelva Bush.  Patient reports no improvement whatsoever with his symptoms, which led physician to perform an MRI of his back, demonstrating acute discitis/osteomyelitis and abscess at L3-L4 and psoas muscles.  Patient reported the pain as lightning twisting, not radiated, movement makes the pain worse and rest because just minimal improvement.  He reported associated chills but no fever.  Pain is 9-10 out of 10 at the worst in intensity.  Patient expressed chills and night sweats intermittently but denies frank fever; patient denies chest pain, shortness of breath, nausea, vomiting, abdominal pain, hematuria, dysuria, melena, hematochezia, urine/feces incontinence or retention.  In the ED blood work was unremarkable; morphine was given for pain control.  Request now received for image guided lumbar paraspinous aspiration for further evaluation.   Past Medical History:  Diagnosis Date  . Arthritis    osteoarthritis  . Dysrhythmia 2000   single episode of afib with RVR with normal echo and rare atrial/ventricular ectopy on f/u Holter ([facility])  . History of kidney stones   . Hypertension   . Obesity (BMI 30.0-34.9) 04/04/2013  . Pneumonia    hx of age 46  . Severe obstructive sleep apnea    do not wear mask/    Past Surgical History:  Procedure Laterality Date  . ACHILLES TENDON REPAIR Left 1987 and 1990  . COLONOSCOPY    . DOPPLER ECHOCARDIOGRAPHY  06/12/1998   normal doppler examination, normal overall LV systolic function  . HIP SURGERY Right    arthoscopic x1, one other surgery   . INCISION AND DRAINAGE HIP Right 09/24/2013   Procedure: RIGHT HIP BEARING SURFACE REVISION;  Surgeon: Gearlean Alf, MD;  Location: [facility];  Service: Orthopedics;  Laterality: Right;  . JOINT REPLACEMENT  1995   right hip  . KIDNEY STONE SURGERY    . LITHOTRIPSY     x4  . NASAL SEPTOPLASTY W/ TURBINOPLASTY Bilateral 01/02/2013   Procedure: NASAL SEPTOPLASTY WITH TURBINATE REDUCTION;  Surgeon: Jodi Marble, MD;  Location: [facility];  Service: ENT;  Laterality: Bilateral;  . NASAL SEPTUM SURGERY  1980's  . RENAL ARTERY DUPLEX  09/22/2004   abdominal aorta-normal taper with no suggestion of aneurysmal dilatation/diameter reduction. normal patency of right and left kidney arteries. right and left kidneys equal in size, symmetrical in shape with normal cortex and medulla with normal resistance indices and normal echogencity with no evidence of hydronephrosis.  Marland Kitchen SHOULDER SURGERY Left 1990's   for burr  . SHOULDER SURGERY Right 2010, 2012   abuttment and rotator cuff repair  . SLEEP STUDY  10/25/2011   AHI during total sleep time - 88.39/hr, AHI during REM 75.00/hr    Allergies: Patient has no known allergies.  Medications: Prior to Admission medications   Medication Sig Start Date End Date Taking? Authorizing Provider  acetaminophen (  TYLENOL) 500 MG tablet Take 500 mg by mouth as needed.   Yes [provider]  amLODipine (NORVASC) 10 MG tablet TAKE 1 TABLET DAILY 11/15/17  Yes Croitoru, Mihai, MD  Ascorbic Acid (VITAMIN C) 1000 MG tablet Take 1,000 mg by mouth daily.   Yes [provider]  diclofenac (VOLTAREN) 75 MG EC tablet  Take 75 mg by mouth 2 (two) times daily. 01/10/18  Yes [provider]  Ferrous Sulfate (IRON) 28 MG TABS Take 1 tablet by mouth. Monday Wednesday Friday   Yes [provider]  gabapentin (NEURONTIN) 300 MG capsule Take 300 mg by mouth 3 (three) times daily. 01/08/18  Yes [provider]  methocarbamol (ROBAXIN) 500 MG tablet Take 500 mg by mouth 3 (three) times daily as needed for pain. 01/16/18  Yes [provider]  Multiple Vitamins-Minerals (MULTIVITAMIN ADULT PO) Take by mouth.   Yes [provider]  olmesartan-hydrochlorothiazide (BENICAR HCT) 40-25 MG tablet Take 1 tablet by mouth daily. 07/17/17  Yes Croitoru, Mihai, MD  spironolactone (ALDACTONE) 25 MG tablet Take 1 tablet (25 mg total) by mouth daily. NEED OV. 12/07/17  Yes Croitoru, Mihai, MD  traMADol (ULTRAM) 50 MG tablet Take 100 mg by mouth 3 (three) times daily as needed for pain. 01/17/18  Yes [provider]  vitamin A 10000 UNIT capsule Take 10,000 Units by mouth daily.   Yes [provider]  olmesartan-hydrochlorothiazide (BENICAR HCT) 40-25 MG tablet Take 1 tablet by mouth daily. NEED OV. Patient not taking: Reported on 01/19/2018 12/07/17   Croitoru, Dani Gobble, MD     Family History  Problem Relation Age of Onset  . Cancer Sister 54       Multiple myeloma    Social History   Socioeconomic History  . Marital status: Married    Spouse name: Not on file  . Number of children: Not on file  . Years of education: Not on file  . Highest education level: Not on file  Occupational History  . Not on file  Social Needs  . Financial resource strain: Not on file  . Food insecurity:    Worry: Not on file    Inability: Not on file  . Transportation needs:    Medical: Not on file    Non-medical: Not on file  Tobacco Use  . Smoking status: Former Smoker    Types: Cigarettes  . Smokeless tobacco: Never Used  . Tobacco comment: quit 30 years ago-social smoker  Substance and  Sexual Activity  . Alcohol use: Yes    Comment: occasional  . Drug use: No  . Sexual activity: Not on file  Lifestyle  . Physical activity:    Days per week: Not on file    Minutes per session: Not on file  . Stress: Not on file  Relationships  . Social connections:    Talks on phone: Not on file    Gets together: Not on file    Attends religious service: Not on file    Active member of club or organization: Not on file    Attends meetings of clubs or organizations: Not on file    Relationship status: Not on file  Other Topics Concern  . Not on file  Social History Narrative  . Not on file      Review of Systems see above  Vital Signs: BP 140/89 (BP Location: Left Arm)   Pulse 61   Temp 98.1 F (36.7 C)   Resp 17  Ht '5\' 8"'  (1.727 m)   Wt 198 lb (89.8 kg)   SpO2 97%   BMI 30.11 kg/m   Physical Exam awake, alert.  Chest clear to auscultation bilaterally.  Heart with regular rate and rhythm.  Abdomen soft, mild generalized tenderness which patient attributes to constipation, positive bowel sounds, no lower extremity edema.  Imaging: Dg Outside Films Spine  Result Date: 01/19/2018 This examination belongs to an outside facility and is stored here for comparison purposes only.  Contact the originating outside institution for any associated report or interpretation.   Labs:  CBC: Recent Labs    01/19/18 1357 01/20/18 0623  WBC 10.5 8.5  HGB 14.4 13.1  HCT 44.1 40.0  PLT 349 307    COAGS: Recent Labs    01/20/18 0910  INR 1.09    BMP: Recent Labs    01/19/18 1357 01/20/18 0623  NA 135 136  K 3.8 4.2  CL 98 103  CO2 27 21*  GLUCOSE 98 95  BUN 26* 21  CALCIUM 10.2 9.6  CREATININE 1.17 0.95  GFRNONAA >60 >60  GFRAA >60 >60    LIVER FUNCTION TESTS: No results for input(s): BILITOT, AST, ALT, ALKPHOS, PROT, ALBUMIN in the last 8760 hours.  TUMOR MARKERS: No results for input(s): AFPTM, CEA, CA199, CHROMGRNA in the last 8760  hours.  Assessment and Plan: Patient with recent epidural injection for acute on chronic back pain without symptom relief; now with activity limiting worsening back pain and imaging findings c/w probable lumbar discitis/osteomyelitis with extension into paraspinal tissues.  Request received for image guided lumbar paraspinous aspiration for further evaluation.  Outside imaging studies have been reviewed by Dr. Kathlene Cote.  There is no discrete focal abscess appreciated for drainage. Details/risks of procedure, including but ot limited to, internal bleeding, infection, injury to adjacent structures discussed with patient with his understanding and consent.  Procedure scheduled for today.   Thank you for this interesting consult.  I greatly enjoyed meeting MISHON BLUBAUGH and look forward to participating in their care.  A copy of this report was sent to the requesting provider on this date.  Electronically Signed: D. Rowe Robert, PA-C 01/20/2018, 10:30 AM   I spent a total of  30 minutes   in face to face in clinical consultation, greater than 50% of which was counseling/coordinating care for image guided lumbar paraspinous aspiration

## 2018-01-20 NOTE — Procedures (Signed)
Interventional Radiology Procedure Note  Procedure: CT guided right paraspinous aspiration in psoas muscle  Complications: None  Estimated Blood Loss: < 10 mL  Findings: Aspiration via 5 Fr centesis catheter within enlarged right psoas muscle at L3/4 level yielded 4 mL of bloody fluid.  Sent for culture analysis.  Jodi MarbleGlenn T. Fredia SorrowYamagata, M.D Pager:  [phone]

## 2018-01-20 NOTE — Progress Notes (Signed)
Asked patient if he would like CPAP tonight?  Patient refused and stated he has not had it this whole time and it has been going fine.

## 2018-01-20 NOTE — Progress Notes (Signed)
PROGRESS NOTE    BRAILON DON  WUJ:[id number] DOB: [birth year] DOA: 01/19/2018 PCP: Sigmund Hazel, MD     Brief Narrative:  62 y.o. male with a past medical history significant for paroxysmal atrial fibrillation, obstructive sleep apnea, hypertension and degenerative joint disease with chronic pain; who presented to the emergency department secondary to worsening lower back pain and difficulty walking.  Patient expressed approximately 2 to 3 weeks of discomfort in his lower back and during this time has been evaluated by Dr. Shelle Iron (orthopedic surgeon) with intended relief for his pain after applying epidural shot in his back.  Patient reports no improvement whatsoever on his symptoms, which led physician to perform an MRI of his back, demonstrating acute discitis/osteomyelitis and abscess at L3-L4 and psoas muscles.  Patient reported the pain as lightning twisting, not radiated, movement makes the pain worse and rest because just minimal improvement.  He reported associated chills but no fever.  Pain is 9-10 out of 10 at the worst in intensity.  Patient expressed chills and night sweats intermittently but denies frank fever; patient denies chest pain, shortness of breath, nausea, vomiting, abdominal pain, hematuria, dysuria, melena, hematochezia, urine/feces incontinence or retention.   Assessment & Plan: 1-discitis/osteomyelitis/myositis appreciated abscess on the lumbar region -Area affected as per MRI results L3-L4. -Patient continued to have chills -CT-guided aspiration per IR has been performed today; cultures and fluid analysis pending -Following ID recommendations will start Rocephin and vancomycin for empiric coverage. -Continue PRN analgesics and muscle relaxant as already ordered. -Follow clinical response.  2-essential hypertension -Stable and well-controlled.   -Continue Norvasc, Avapro, HCTZ and Aldactone. -Heart healthy diet has been ordered -Follow vital  signs.  3-paroxysmal atrial fibrillation -On physical exam patient's heart rate continued to be control and cyanosis. -CHADsVASC score is 1 -Not on any anticoagulation currently.  4-obstructive sleep apnea -Continue CPAP nightly.  5-chronic back pain -Continue as needed analgesics and the use of Neurontin -PT/OT will be ordered.  DVT prophylaxis: SCD's. Code Status: Full code Family Communication: No family at bedside. Disposition Plan: Follow cultures results, start vancomycin and Rocephin as indicated by ID; continue PRN analgesics and supportive care.  Follow clinical response.  Consultants:   IR (Dr. Fredia Sorrow)  ID (Dr. Luciana Axe)  EDP consulted neurosurgery (Dr. Venetia Maxon).  Procedures:   CT guided aspiration of Lumbar abscess. 01/20/18  Antimicrobials:  Anti-infectives (From admission, onward)   Start     Dose/Rate Route Frequency Ordered Stop   01/20/18 1400  cefTRIAXone (ROCEPHIN) 2 g in sodium chloride 0.9 % 100 mL IVPB     2 g 200 mL/hr over 30 Minutes Intravenous Every 24 hours 01/20/18 1346     01/20/18 0430  vancomycin (VANCOCIN) IVPB 1000 mg/200 mL premix  Status:  Discontinued     1,000 mg 200 mL/hr over 60 Minutes Intravenous Every 12 hours 01/19/18 1542 01/19/18 1711   01/19/18 2200  piperacillin-tazobactam (ZOSYN) IVPB 3.375 g  Status:  Discontinued     3.375 g 12.5 mL/hr over 240 Minutes Intravenous Every 8 hours 01/19/18 1542 01/19/18 1711   01/19/18 1530  vancomycin (VANCOCIN) 1,750 mg in sodium chloride 0.9 % 500 mL IVPB     1,750 mg 250 mL/hr over 120 Minutes Intravenous  Once 01/19/18 1507 01/19/18 1811   01/19/18 1515  piperacillin-tazobactam (ZOSYN) IVPB 3.375 g     3.375 g 100 mL/hr over 30 Minutes Intravenous  Once 01/19/18 1507 01/19/18 1711       Subjective: Patient reports chills in the  morning; no nausea, no vomiting, no chest pain, no shortness of breath, no abdominal pain.  Continued to have lower back discomfort (left side more than  right) and also feeling weak.  Objective: Vitals:   01/20/18 1220 01/20/18 1225 01/20/18 1230 01/20/18 1245  BP: 140/90  (!) 143/89 (!) 146/90  Pulse: 66 68 68 67  Resp: 10  10 12   Temp:      TempSrc:      SpO2: 97%  98% 99%  Weight:      Height:        Intake/Output Summary (Last 24 hours) at 01/20/2018 1347 Last data filed at 01/20/2018 [id number] Gross per 24 hour  Intake 1209.11 ml  Output 1500 ml  Net -290.89 ml   Filed Weights   01/19/18 1244  Weight: 89.8 kg    Examination: General exam: Alert, awake, oriented x 3; patient reports some improvement in his lower back pain; but still having discomfort and feeling weak. Reports chills this morning. No fever.  Respiratory system: Clear to auscultation. Respiratory effort normal. Cardiovascular system:RRR. No murmurs, rubs, gallops. Gastrointestinal system: Abdomen is nondistended, soft and nontender. No organomegaly or masses felt. Normal bowel sounds heard. Central nervous system: Alert and oriented. No focal neurological deficits. Extremities: No C/C/E, +pedal pulses Skin: No rashes, lesions or ulcers Psychiatry: Judgement and insight appear normal. Mood & affect appropriate.     Data Reviewed: I have personally reviewed following labs and imaging studies  CBC: Recent Labs  Lab 01/19/18 1357 01/20/18 0623  WBC 10.5 8.5  HGB 14.4 13.1  HCT 44.1 40.0  MCV 86.8 86.2  PLT 349 307   Basic Metabolic Panel: Recent Labs  Lab 01/19/18 1357 01/20/18 0623  NA 135 136  K 3.8 4.2  CL 98 103  CO2 27 21*  GLUCOSE 98 95  BUN 26* 21  CREATININE 1.17 0.95  CALCIUM 10.2 9.6   GFR: Estimated Creatinine Clearance: 86.7 mL/min (by C-G formula based on SCr of 0.95 mg/dL).  Coagulation Profile: Recent Labs  Lab 01/20/18 0910  INR 1.09   Urine analysis:    Component Value Date/Time   COLORURINE YELLOW 09/24/2013 1154   APPEARANCEUR CLEAR 09/24/2013 1154   LABSPEC 1.020 09/24/2013 1154   PHURINE 6.0 09/24/2013 1154    GLUCOSEU NEGATIVE 09/24/2013 1154   HGBUR TRACE (A) 09/24/2013 1154   BILIRUBINUR NEGATIVE 09/24/2013 1154   KETONESUR NEGATIVE 09/24/2013 1154   PROTEINUR NEGATIVE 09/24/2013 1154   UROBILINOGEN 0.2 09/24/2013 1154   NITRITE NEGATIVE 09/24/2013 1154   LEUKOCYTESUR TRACE (A) 09/24/2013 1154    Recent Results (from the past 240 hour(s))  Blood culture (routine x 2)     Status: None (Preliminary result)   Collection Time: 01/19/18  2:58 PM  Result Value Ref Range Status   Specimen Description BLOOD LEFT ANTECUBITAL  Final   Special Requests   Final    BOTTLES DRAWN AEROBIC AND ANAEROBIC Blood Culture adequate volume   Culture   Final    NO GROWTH < 24 HOURS Performed at [facility], [address] [place], [place] [address]    Report Status PENDING  Incomplete  Culture, blood (Routine X 2) w Reflex to ID Panel     Status: None (Preliminary result)   Collection Time: 01/19/18  3:03 PM  Result Value Ref Range Status   Specimen Description BLOOD RIGHT HAND  Final   Special Requests   Final    BOTTLES DRAWN AEROBIC AND ANAEROBIC Blood Culture  results may not be optimal due to an excessive volume of blood received in culture bottles   Culture   Final    NO GROWTH < 24 HOURS Performed at [facility], [address] [place], [place] [address]    Report Status PENDING  Incomplete     Radiology Studies: Ct Aspiration  Result Date: 01/20/2018 INDICATION: Discitis at L3-4 with associated evidence by imaging of left-sided paraspinous infection extending into the paraspinous musculature and left psoas muscle. The patient presents for image guided aspiration for diagnostic purposes. EXAM: CT GUIDED ASPIRATION OF LEFT PARASPINOUS PSOAS MUSCLE COMPARISON:  MRI of the lumbar spine at [facility] on 01/18/2018. ANESTHESIA/SEDATION: Fentanyl 50 mcg IV; Versed 1.0 mg IV Moderate Sedation Time:  10 minutes. The patient was continuously monitored during the procedure by the  interventional radiology nurse under my direct supervision. MEDICATIONS: No additional medications. COMPLICATIONS: None immediate. PROCEDURE: Informed written consent was obtained from the patient after a thorough discussion of the procedural risks, benefits and alternatives. All questions were addressed. Maximal Sterile Barrier Technique was utilized including caps, mask, sterile gowns, sterile gloves, sterile drape, hand hygiene and skin antiseptic. A timeout was performed prior to the initiation of the procedure. CT was performed in a prone position through the lumbar region. A 5 [place] Yueh centesis needle sheath catheter was advanced under CT guidance into the medial aspect of the left psoas muscle adjacent to the lumbar spine at the L3-4 level. Aspiration was performed. The centesis needle was repositioned twice during aspiration. Fluid sample was sent for culture analysis. FINDINGS: CT demonstrates destructive process centered at the L3-4 level affecting primarily the left side of the disc space and extending into the adjacent left psoas muscle and posterior paraspinous musculature. There is edema in the psoas muscle with relative enlargement of the left psoas compared to the right. Aspiration at the level of central edema within the left medial psoas muscle yielded a total of 4 mL of bloody fluid. This was sent for culture analysis. No discrete drainable abscess was identified to warrant any drain placement at this time. IMPRESSION: CT-guided paraspinous aspiration at the L3-4 level within an edematous portion of the medial left ileo-psoas muscle abutting the L3-4 disc space. This yielded approximately 4 mL of bloody fluid which was sent for culture analysis. Electronically Signed   By: Irish Lack M.D.   On: 01/20/2018 13:33   Dg Outside Films Spine  Result Date: 01/19/2018 This examination belongs to an outside facility and is stored here for comparison purposes only.  Contact the originating  outside institution for any associated report or interpretation.   Scheduled Meds: . amLODipine  10 mg Oral Daily  . diclofenac  75 mg Oral BID WC  . fentaNYL      . ferrous sulfate  325 mg Oral Q M,W,F  . gabapentin  300 mg Oral TID  . irbesartan  300 mg Oral Daily   And  . hydrochlorothiazide  25 mg Oral Daily  . lidocaine      . midazolam      . multivitamin with minerals  1 tablet Oral q morning - 10a  . spironolactone  25 mg Oral Daily  . vitamin C  1,000 mg Oral Daily   Continuous Infusions: . sodium chloride 100 mL/hr at 01/20/18 0622  . cefTRIAXone (ROCEPHIN)  IV       LOS: 1 day    Time spent: 30 minutes.    Vassie Loll, MD  [facility] Pager [phone]  If 7PM-7AM, please contact night-coverage [email or web address] Password [email or web address] 01/20/2018, 1:47 PM

## 2018-01-20 NOTE — Progress Notes (Signed)
Pt returned to unit.

## 2018-01-21 NOTE — Evaluation (Signed)
Occupational Therapy Evaluation Patient Details Name: Luis Mccann MRN: [id number] DOB: 19-Sep-1955 Today's Date: 01/21/2018    History of Present Illness 62 y.o.malewith a past medical history significant for paroxysmal atrial fibrillation, obstructive sleep apnea, hypertension and degenerative joint disease with chronic pain; who presented to the emergency department secondary to worsening lower back pain and difficulty walking. Patient expressed approximately 2 to 3 weeks of discomfort in his lower back and during this time has been evaluated by Dr. Educational psychologist) with intended relief for his pain after applying epidural shot in his back. Patient reports no improvement whatsoever on his symptoms, which led physician to perform an MRI of his back,demonstrating acute discitis/osteomyelitis and abscess at L3-L4 andpsoas muscles. CT-guided aspiration per IR has been performed; cultures and fluid analysis pending   Clinical Impression   PTA patient independent and active prior to onset of pain, declining independence in the last 5 weeks.  Patient admitted for above and limited by significant back pain, LE weakness with inability to maintain full stand with 2+ assist, impaired balance, and decreased activity tolerance affecting participation in ADLs, IADLs and mobility.  He requires min assist for bed mobility, +2 mod assist for toilet transfers (simulated), setup assist for UB ADLs, and mod assist +2 for LB ADLs.  He will benefit from continued OT services while admitted and after dc at [facility] level in order to maximize return to independent PLOF.  Will continue to follow.      Follow Up Recommendations  Home health OT;Supervision/Assistance - 24 hour    Equipment Recommendations  None recommended by OT    Recommendations for Other Services       Precautions / Restrictions Precautions Precautions: Back Precaution Comments: Back precautions for comfort with getting  up Restrictions Weight Bearing Restrictions: No      Mobility Bed Mobility Overal bed mobility: Needs Assistance Bed Mobility: Rolling;Sidelying to Sit Rolling: Min assist Sidelying to sit: Min assist       General bed mobility comments: min assist to roll and asend into sitting given verbal and tactile cueing for log roll technique  Transfers Overall transfer level: Needs assistance Equipment used: 2 person hand held assist Transfers: Sit to/from Stand Sit to Stand: Mod assist;+2 physical assistance         General transfer comment: Bilateral assist to come to stand; unable to acheive full bilateral hip extension in standing due to pain; heavy dependence on UE support    Balance Overall balance assessment: Needs assistance Sitting-balance support: No upper extremity supported;Feet supported Sitting balance-[name]: Fair     Standing balance support: Bilateral upper extremity supported;During functional activity Standing balance-[name]: Poor Standing balance comment: Difficulty getting to full upright standing due to pain, reliant on heavy B UE support                           ADL either performed or assessed with clinical judgement   ADL Overall ADL's : Needs assistance/impaired Eating/Feeding: Set up;Sitting;Supervision/ safety   Grooming: Supervision/safety;Set up;Sitting   Upper Body Bathing: Set up;Sitting   Lower Body Bathing: Moderate assistance;+2 for physical assistance;Sit to/from stand   Upper Body Dressing : Set up;Sitting   Lower Body Dressing: Moderate assistance;+2 for physical assistance;Sit to/from stand   Toilet Transfer: Moderate assistance;+2 for physical assistance;Stand-pivot;Cueing for safety(simulated to recliner )   Toileting- Clothing Manipulation and Hygiene: +2 for physical assistance;Maximal assistance;Sit to/from stand       Functional  mobility during ADLs: Moderate assistance;+2 for physical  assistance(stand pivot only) General ADL Comments: Patient signficantly limited by pain, increased time required for all tasks. Requires +2 in standing.      Vision Baseline Vision/History: Wears glasses Wears Glasses: At all times Patient Visual Report: No change from baseline Vision Assessment?: No apparent visual deficits     Perception     Praxis      Pertinent Vitals/Pain Pain Assessment: Faces Faces Pain Scale: Hurts whole lot Pain Location: Low back and L hip, wrapping around outside of hip anteriorly with attempts at standing Pain Descriptors / Indicators: Grimacing;Guarding;Discomfort;Aching Pain Intervention(s): Monitored during session;Premedicated before session;Repositioned;Limited activity within patient's tolerance     Hand Dominance Right   Extremity/Trunk Assessment Upper Extremity Assessment Upper Extremity Assessment: Overall WFL for tasks assessed   Lower Extremity Assessment Lower Extremity Assessment: Defer to PT evaluation       Communication Communication Communication: No difficulties   Cognition Arousal/Alertness: Awake/alert Behavior During Therapy: WFL for tasks assessed/performed;Anxious Overall Cognitive Status: Within Functional Limits for tasks assessed                                 General Comments: easily distracted, very anxious    General Comments       Exercises     Shoulder Instructions      Home Living Family/patient expects to be discharged to:: Private residence Living Arrangements: Spouse/significant other Available Help at Discharge: Family;Available 24 hours/day Type of Home: House Home Access: Stairs to enter [employer] of Steps: 2 Entrance Stairs-Rails: None Home Layout: Two level;Bed/bath upstairs;1/2 bath on main level;Other (Comment)(can stay in recliner on 1st floor if needed) Alternate Level Stairs-Number of Steps: 12 Alternate Level Stairs-Rails: Right Bathroom Shower/Tub:  Chief Strategy Officer: Standard     Home Equipment: Shower seat;Bedside commode;Crutches;Walker - 2 wheels          Prior Functioning/Environment Level of Independence: Independent        Comments: independent and active at gym        OT Problem List: Decreased strength;Decreased activity tolerance;Impaired balance (sitting and/or standing);Decreased safety awareness;Decreased knowledge of use of DME or AE;Decreased knowledge of precautions;Pain      OT Treatment/Interventions: Self-care/ADL training;Therapeutic exercise;Energy conservation;DME and/or AE instruction;Therapeutic activities;Patient/family education;Balance training    OT Goals(Current goals can be found in the care plan section) Acute Rehab OT Goals Patient Stated Goal: less pain, back to normal OT Goal Formulation: With patient Time For Goal Achievement: 02/04/18 Potential to Achieve Goals: Good  OT Frequency: Min 2X/week   Barriers to D/C:            Co-evaluation PT/OT/SLP Co-Evaluation/Treatment: Yes Reason for Co-Treatment: To address functional/ADL transfers;For patient/therapist safety   OT goals addressed during session: ADL's and self-care;Other (comment)(transfers/mobility)      AM-PAC PT "6 Clicks" Daily Activity     Outcome Measure Help from another person eating meals?: None Help from another person taking care of personal grooming?: None(seated) Help from another person toileting, which includes using toliet, bedpan, or urinal?: A Lot Help from another person bathing (including washing, rinsing, drying)?: A Lot Help from another person to put on and taking off regular upper body clothing?: None Help from another person to put on and taking off regular lower body clothing?: A Lot 6 Click Score: 18   End of Session Equipment Utilized During Treatment: Gait belt Nurse Communication: Mobility  status;Precautions  Activity Tolerance: Patient tolerated treatment well;Patient  limited by pain Patient left: in chair;with call bell/phone within reach  OT Visit Diagnosis: Other abnormalities of gait and mobility (R26.89);Muscle weakness (generalized) (M62.81);Pain Pain - Right/Left: Left Pain - part of body: Hip;Leg(and back)                Time: [id number] OT Time Calculation (min): 31 min Charges:  OT General Charges $OT Visit: 1 Visit OT Evaluation $OT Eval Moderate Complexity: 1 Mod  Chancy Milroyhristie S Kendre Sires, OT Acute Rehabilitation Services Pager [phone] Office [phone]   Chancy MilroyChristie S Mikal Blasdell 01/21/2018, 4:16 PM

## 2018-01-21 NOTE — Evaluation (Signed)
Physical Therapy Evaluation Patient Details Name: Luis Mccann MRN: [id number] DOB: 02/15/1956 Today's Date: 01/21/2018   History of Present Illness  62 y.o.malewith a past medical history significant for paroxysmal atrial fibrillation, obstructive sleep apnea, hypertension and degenerative joint disease with chronic pain; who presented to the emergency department secondary to worsening lower back pain and difficulty walking. Patient expressed approximately 2 to 3 weeks of discomfort in his lower back and during this time has been evaluated by Dr. Educational psychologist) with intended relief for his pain after applying epidural shot in his back. Patient reports no improvement whatsoever on his symptoms, which led physician to perform an MRI of his back,demonstrating acute discitis/osteomyelitis and abscess at L3-L4 andpsoas muscles. CT-guided aspiration per IR has been performed; cultures and fluid analysis pending  Clinical Impression   Pt admitted with above diagnosis. Pt currently with functional limitations due to the deficits listed below (see PT Problem List). Independent and active prior to onset of back pain approx 5 weeks ago;  During the past few weeks, pain has caused a precipitous decline in functional mobility and ADLs, leading to where he would stay in recliner most of the day, and use a urinal to avoid back pain with walking to the bathroom; Presents with decr functional mobility, pain, difficulty standing and walking; Pt will benefit from skilled PT to increase their independence and safety with mobility to allow discharge to the venue listed below.       Follow Up Recommendations Home health PT;Supervision/Assistance - 24 hour    Equipment Recommendations  None recommended by PT(pretty well-equipped)    Recommendations for Other Services       Precautions / Restrictions Precautions Precautions: Back Precaution Comments: Back precautions for comfort with getting up       Mobility  Bed Mobility Overal bed mobility: Needs Assistance Bed Mobility: Rolling;Sidelying to Sit Rolling: Min assist Sidelying to sit: Min assist       General bed mobility comments: Verbal and tactile cues for log roll technique and sidelying to sit, with the hopes of minimizing pain while coming to sit EOB  Transfers Overall transfer level: Needs assistance Equipment used: 2 person hand held assist Transfers: Sit to/from Stand Sit to Stand: Mod assist;+2 physical assistance         General transfer comment: Bilateral assist to come to stand; unable to acheive full bilateral hip extension in standing due to pain; heavy dependence on UE support  Ambulation/Gait Ambulation/Gait assistance: Mod assist;+2 physical assistance Gait Distance (Feet): (pivotal steps bed to recliner) Assistive device: 2 person hand held assist Gait Pattern/deviations: Shuffle     General Gait Details: Painful, and bilateral hips semi-flexed the whole time  Stairs            Wheelchair Mobility    Modified Rankin (Stroke Patients Only)       Balance Overall balance assessment: Needs assistance   Sitting balance-[name]: Fair(Approaching Good)       Standing balance-[name]: Poor Standing balance comment: Difficulty getting to full upright standing due to pain                             Pertinent Vitals/Pain Pain Assessment: Faces Faces Pain Scale: Hurts whole lot Pain Location: Low back and L hip, wrapping around outside of hip anteriorly with attempts at standing Pain Descriptors / Indicators: Grimacing;Guarding;Discomfort;Aching(Severe pain) Pain Intervention(s): Monitored during session;Premedicated before session;Repositioned    Home Living  Family/patient expects to be discharged to:: Private residence Living Arrangements: Spouse/significant other Available Help at Discharge: Family;Available 24 hours/day Type of Home: House Home Access:  Stairs to enter Entrance Stairs-Rails: None Entrance Stairs-Number of Steps: 2 Home Layout: Two level;Bed/bath upstairs;1/2 bath on main level;Other (Comment)(Can stay in recliner on first floor if needed) Home Equipment: Shower seat;Bedside commode;Crutches;Walker - 2 wheels      Prior Function Level of Independence: Independent         Comments: goes to gym     Hand Dominance        Extremity/Trunk Assessment   Upper Extremity Assessment Upper Extremity Assessment: Defer to OT evaluation    Lower Extremity Assessment Lower Extremity Assessment: Generalized weakness(with hip and low back pain)       Communication   Communication: No difficulties  Cognition Arousal/Alertness: Awake/alert Behavior During Therapy: WFL for tasks assessed/performed;Anxious Overall Cognitive Status: Within Functional Limits for tasks assessed                                 General Comments: Easily distractible by pain; very anxious and tending to talk a lot      General Comments      Exercises     Assessment/Plan    PT Assessment Patient needs continued PT services  PT Problem List Decreased strength;Decreased range of motion;Decreased activity tolerance;Decreased balance;Decreased mobility;Decreased coordination;Decreased knowledge of use of DME;Decreased knowledge of precautions;Pain       PT Treatment Interventions DME instruction;Gait training;Stair training;Functional mobility training;Therapeutic activities;Therapeutic exercise;Patient/family education    PT Goals (Current goals can be found in the Care Plan section)  Acute Rehab PT Goals Patient Stated Goal: less pain, back to normal PT Goal Formulation: With patient Time For Goal Achievement: 02/04/18 Potential to Achieve Goals: Fair    Frequency Min 4X/week   Barriers to discharge        Co-evaluation               AM-PAC PT "6 Clicks" Daily Activity  Outcome Measure Difficulty turning  over in bed (including adjusting bedclothes, sheets and blankets)?: A Little Difficulty moving from lying on back to sitting on the side of the bed? : A Lot Difficulty sitting down on and standing up from a chair with arms (e.g., wheelchair, bedside commode, etc,.)?: Unable Help needed moving to and from a bed to chair (including a wheelchair)?: A Lot Help needed walking in hospital room?: A Lot Help needed climbing 3-5 steps with a railing? : A Lot 6 Click Score: 12    End of Session Equipment Utilized During Treatment: Gait belt Activity Tolerance: Patient limited by pain Patient left: in chair;with call bell/phone within reach Nurse Communication: Mobility status;Other (comment)(anticipate pain will limit sitting tolerance) PT Visit Diagnosis: Unsteadiness on feet (R26.81);Other abnormalities of gait and mobility (R26.89);Pain Pain - Right/Left: Left Pain - part of body: Hip(and low back)    Time: [id number] PT Time Calculation (min) (ACUTE ONLY): 31 min   Charges:   PT Evaluation $PT Eval Moderate Complexity: 1 Mod          Van ClinesHolly Perle Brickhouse, PT  Acute Rehabilitation Services Pager [phone] Office [phone]   Levi AlandHolly H Adelae Yodice 01/21/2018, 3:42 PM

## 2018-01-21 NOTE — Progress Notes (Signed)
PROGRESS NOTE    Luis Mccann  ZOX:[id number] DOB: 04-06-1956 DOA: 01/19/2018 PCP: Sigmund Hazel, MD     Brief Narrative:  62 y.o. male with a past medical history significant for paroxysmal atrial fibrillation, obstructive sleep apnea, hypertension and degenerative joint disease with chronic pain; who presented to the emergency department secondary to worsening lower back pain and difficulty walking.  Patient expressed approximately 2 to 3 weeks of discomfort in his lower back and during this time has been evaluated by Dr. Shelle Iron (orthopedic surgeon) with intended relief for his pain after applying epidural shot in his back.  Patient reports no improvement whatsoever on his symptoms, which led physician to perform an MRI of his back, demonstrating acute discitis/osteomyelitis and abscess at L3-L4 and psoas muscles.  Patient reported the pain as lightning twisting, not radiated, movement makes the pain worse and rest because just minimal improvement.  He reported associated chills but no fever.  Pain is 9-10 out of 10 at the worst in intensity.  Patient expressed chills and night sweats intermittently but denies frank fever; patient denies chest pain, shortness of breath, nausea, vomiting, abdominal pain, hematuria, dysuria, melena, hematochezia, urine/feces incontinence or retention.   Assessment & Plan: 1-discitis/osteomyelitis/myositis appreciated abscess on the lumbar region -Area affected as per MRI results L3-L4. -Patient denies any further chills/night sweats and is afebrile. -CT-guided aspiration per IR has been performed on 9/21; cultures and fluid analysis pending -Following ID recommendations will continue Rocephin and vancomycin for empiric coverage. -Continue PRN analgesics and muscle relaxant as already ordered. -Follow clinical response.  2-essential hypertension -Stable and well-controlled.   -Continue Norvasc, Avapro, HCTZ and Aldactone. -Heart healthy diet has been  ordered -Follow vital signs.  3-paroxysmal atrial fibrillation -On physical exam patient's heart rate continued to be control and cyanosis. -CHADsVASC score is 1 -Not on any anticoagulation currently.  4-obstructive sleep apnea -Patient has been refusing CPAP at nighttime. -educated about importance in compliance.   5-chronic back pain -Continue as needed analgesics and the use of Neurontin -PT has seen patient and is recommending home health services at discharge.  DVT prophylaxis: SCD's. Code Status: Full code Family Communication: No family at bedside. Disposition Plan: Follow cultures results, continue vancomycin and Rocephin as indicated by ID; continue PRN analgesics and supportive care.  Follow clinical response.  Consultants:   IR (Dr. Fredia Sorrow)  ID (Dr. Luciana Axe)  EDP consulted neurosurgery (Dr. Venetia Maxon).  Procedures:   CT guided aspiration of Lumbar abscess. 01/20/18  Antimicrobials:  Anti-infectives (From admission, onward)   Start     Dose/Rate Route Frequency Ordered Stop   01/20/18 1500  cefTRIAXone (ROCEPHIN) 2 g in sodium chloride 0.9 % 100 mL IVPB     2 g 200 mL/hr over 30 Minutes Intravenous Every 24 hours 01/20/18 1346     01/20/18 0430  vancomycin (VANCOCIN) IVPB 1000 mg/200 mL premix  Status:  Discontinued     1,000 mg 200 mL/hr over 60 Minutes Intravenous Every 12 hours 01/19/18 1542 01/19/18 1711   01/19/18 2200  piperacillin-tazobactam (ZOSYN) IVPB 3.375 g  Status:  Discontinued     3.375 g 12.5 mL/hr over 240 Minutes Intravenous Every 8 hours 01/19/18 1542 01/19/18 1711   01/19/18 1530  vancomycin (VANCOCIN) 1,750 mg in sodium chloride 0.9 % 500 mL IVPB     1,750 mg 250 mL/hr over 120 Minutes Intravenous  Once 01/19/18 1507 01/19/18 1811   01/19/18 1515  piperacillin-tazobactam (ZOSYN) IVPB 3.375 g     3.375 g 100  mL/hr over 30 Minutes Intravenous  Once 01/19/18 1507 01/19/18 1711       Subjective: Overall feeling better, no nausea, no  vomiting, no chest pain, no shortness of breath.  Afebrile and denying any further chills or sweating.  Reports his lower back pain is much better controlled.  Objective: Vitals:   01/20/18 2021 01/20/18 2356 01/21/18 0420 01/21/18 1132  BP: 122/81 136/81 131/89 124/81  Pulse: 62 69 69 66  Resp: 18 18 18 15   Temp: 98.4 F (36.9 C) 98.6 F (37 C) 98.8 F (37.1 C) 98.6 F (37 C)  TempSrc: Oral Oral Oral Oral  SpO2: 97% 99% 94% 98%  Weight:      Height:        Intake/Output Summary (Last 24 hours) at 01/21/2018 1607 Last data filed at 01/21/2018 1100 Gross per 24 hour  Intake 2068.71 ml  Output 1200 ml  Net 868.71 ml   Filed Weights   01/19/18 1244  Weight: 89.8 kg    Examination: General exam: Alert, awake, oriented x 3; patient has remained afebrile and currently is denying any chills or night sweats.  No nausea, no vomiting, no chest pain, no shortness of breath.  Reports improvement in his pain. Respiratory system: Clear to auscultation. Respiratory effort normal. Cardiovascular system:RRR. No murmurs, rubs, gallops. Gastrointestinal system: Abdomen is nondistended, soft and nontender. No organomegaly or masses felt. Normal bowel sounds heard. Central nervous system: Alert and oriented. No focal neurological deficits. Extremities: No C/C/E, +pedal pulses Skin: No rashes, lesions or ulcers Psychiatry: Judgement and insight appear normal. Mood & affect appropriate.    Data Reviewed: I have personally reviewed following labs and imaging studies  CBC: Recent Labs  Lab 01/19/18 1357 01/20/18 0623  WBC 10.5 8.5  HGB 14.4 13.1  HCT 44.1 40.0  MCV 86.8 86.2  PLT 349 307   Basic Metabolic Panel: Recent Labs  Lab 01/19/18 1357 01/20/18 0623  NA 135 136  K 3.8 4.2  CL 98 103  CO2 27 21*  GLUCOSE 98 95  BUN 26* 21  CREATININE 1.17 0.95  CALCIUM 10.2 9.6   GFR: Estimated Creatinine Clearance: 86.7 mL/min (by C-G formula based on SCr of 0.95  mg/dL).  Coagulation Profile: Recent Labs  Lab 01/20/18 0910  INR 1.09   Urine analysis:    Component Value Date/Time   COLORURINE YELLOW 09/24/2013 1154   APPEARANCEUR CLEAR 09/24/2013 1154   LABSPEC 1.020 09/24/2013 1154   PHURINE 6.0 09/24/2013 1154   GLUCOSEU NEGATIVE 09/24/2013 1154   HGBUR TRACE (A) 09/24/2013 1154   BILIRUBINUR NEGATIVE 09/24/2013 1154   KETONESUR NEGATIVE 09/24/2013 1154   PROTEINUR NEGATIVE 09/24/2013 1154   UROBILINOGEN 0.2 09/24/2013 1154   NITRITE NEGATIVE 09/24/2013 1154   LEUKOCYTESUR TRACE (A) 09/24/2013 1154    Recent Results (from the past 240 hour(s))  Blood culture (routine x 2)     Status: None (Preliminary result)   Collection Time: 01/19/18  2:58 PM  Result Value Ref Range Status   Specimen Description BLOOD LEFT ANTECUBITAL  Final   Special Requests   Final    BOTTLES DRAWN AEROBIC AND ANAEROBIC Blood Culture adequate volume   Culture   Final    NO GROWTH 2 DAYS Performed at [facility], [address] [place], [place] [address]    Report Status PENDING  Incomplete  Culture, blood (Routine X 2) w Reflex to ID Panel     Status: None (Preliminary result)   Collection Time:  01/19/18  3:03 PM  Result Value Ref Range Status   Specimen Description BLOOD RIGHT HAND  Final   Special Requests   Final    BOTTLES DRAWN AEROBIC AND ANAEROBIC Blood Culture results may not be optimal due to an excessive volume of blood received in culture bottles   Culture   Final    NO GROWTH 2 DAYS Performed at [facility], [address] [place], [place] [address]    Report Status PENDING  Incomplete  Aerobic/Anaerobic Culture (surgical/deep wound)     Status: None (Preliminary result)   Collection Time: 01/20/18 12:43 PM  Result Value Ref Range Status   Specimen Description ABSCESS  Final   Special Requests Normal  Final   Gram Stain   Final    FEW WBC PRESENT,BOTH PMN AND MONONUCLEAR NO ORGANISMS SEEN    Culture   Final     CULTURE REINCUBATED FOR BETTER GROWTH Performed at [facility], [address] [place], [place] [address]    Report Status PENDING  Incomplete     Radiology Studies: Ct Aspiration  Result Date: 01/20/2018 INDICATION: Discitis at L3-4 with associated evidence by imaging of left-sided paraspinous infection extending into the paraspinous musculature and left psoas muscle. The patient presents for image guided aspiration for diagnostic purposes. EXAM: CT GUIDED ASPIRATION OF LEFT PARASPINOUS PSOAS MUSCLE COMPARISON:  MRI of the lumbar spine at [facility] on 01/18/2018. ANESTHESIA/SEDATION: Fentanyl 50 mcg IV; Versed 1.0 mg IV Moderate Sedation Time:  10 minutes. The patient was continuously monitored during the procedure by the interventional radiology nurse under my direct supervision. MEDICATIONS: No additional medications. COMPLICATIONS: None immediate. PROCEDURE: Informed written consent was obtained from the patient after a thorough discussion of the procedural risks, benefits and alternatives. All questions were addressed. Maximal Sterile Barrier Technique was utilized including caps, mask, sterile gowns, sterile gloves, sterile drape, hand hygiene and skin antiseptic. A timeout was performed prior to the initiation of the procedure. CT was performed in a prone position through the lumbar region. A 5 [place] Yueh centesis needle sheath catheter was advanced under CT guidance into the medial aspect of the left psoas muscle adjacent to the lumbar spine at the L3-4 level. Aspiration was performed. The centesis needle was repositioned twice during aspiration. Fluid sample was sent for culture analysis. FINDINGS: CT demonstrates destructive process centered at the L3-4 level affecting primarily the left side of the disc space and extending into the adjacent left psoas muscle and posterior paraspinous musculature. There is edema in the psoas muscle with relative enlargement of the left psoas  compared to the right. Aspiration at the level of central edema within the left medial psoas muscle yielded a total of 4 mL of bloody fluid. This was sent for culture analysis. No discrete drainable abscess was identified to warrant any drain placement at this time. IMPRESSION: CT-guided paraspinous aspiration at the L3-4 level within an edematous portion of the medial left ileo-psoas muscle abutting the L3-4 disc space. This yielded approximately 4 mL of bloody fluid which was sent for culture analysis. Electronically Signed   By: Irish LackGlenn  Yamagata M.D.   On: 01/20/2018 13:33   Dg Outside Films Spine  Result Date: 01/19/2018 This examination belongs to an outside facility and is stored here for comparison purposes only.  Contact the originating outside institution for any associated report or interpretation.   Scheduled Meds: . amLODipine  10 mg Oral Daily  . diclofenac  75 mg Oral  BID WC  . ferrous sulfate  325 mg Oral Q M,W,F  . gabapentin  300 mg Oral TID  . irbesartan  300 mg Oral Daily   And  . hydrochlorothiazide  25 mg Oral Daily  . multivitamin with minerals  1 tablet Oral q morning - 10a  . spironolactone  25 mg Oral Daily  . vitamin C  1,000 mg Oral Daily   Continuous Infusions: . sodium chloride 100 mL/hr at 01/20/18 1954  . cefTRIAXone (ROCEPHIN)  IV 200 mL/hr at 01/21/18 0023     LOS: 2 days    Time spent: 30 minutes.    Vassie Loll, MD [facility] Pager [phone]  If 7PM-7AM, please contact night-coverage [email or web address] Password [facility] 01/21/2018, 4:07 PM

## 2018-01-22 ENCOUNTER — Inpatient Hospital Stay: Payer: Self-pay

## 2018-01-22 DIAGNOSIS — G061 Intraspinal abscess and granuloma: Secondary | ICD-10-CM

## 2018-01-22 DIAGNOSIS — B963 Hemophilus influenzae [H. influenzae] as the cause of diseases classified elsewhere: Secondary | ICD-10-CM

## 2018-01-22 DIAGNOSIS — G4733 Obstructive sleep apnea (adult) (pediatric): Secondary | ICD-10-CM

## 2018-01-22 DIAGNOSIS — K6812 Psoas muscle abscess: Secondary | ICD-10-CM

## 2018-01-22 DIAGNOSIS — M4646 Discitis, unspecified, lumbar region: Secondary | ICD-10-CM

## 2018-01-22 DIAGNOSIS — Z87891 Personal history of nicotine dependence: Secondary | ICD-10-CM

## 2018-01-22 DIAGNOSIS — M4626 Osteomyelitis of vertebra, lumbar region: Principal | ICD-10-CM

## 2018-01-22 DIAGNOSIS — I480 Paroxysmal atrial fibrillation: Secondary | ICD-10-CM

## 2018-01-22 MED ORDER — ACETAMINOPHEN 500 MG PO TABS
500.0000 mg | ORAL_TABLET | Freq: Four times a day (QID) | ORAL | Status: DC | PRN
  Administered 2018-01-22 – 2018-01-24 (×2): 500 mg via ORAL
  Filled 2018-01-22 (×2): qty 1

## 2018-01-22 MED ORDER — MORPHINE SULFATE (PF) 4 MG/ML IV SOLN
4.0000 mg | INTRAVENOUS | Status: DC | PRN
  Administered 2018-01-22 (×2): 4 mg via INTRAVENOUS
  Filled 2018-01-22 (×2): qty 1

## 2018-01-22 MED ORDER — OXYCODONE HCL 5 MG PO TABS
5.0000 mg | ORAL_TABLET | ORAL | Status: DC | PRN
  Administered 2018-01-22 – 2018-01-24 (×9): 10 mg via ORAL
  Filled 2018-01-22 (×9): qty 2

## 2018-01-22 NOTE — Progress Notes (Addendum)
PROGRESS NOTE  Luis Mccann  ZOX:[id number] DOB: 11-29-1955 DOA: 01/19/2018 PCP: Sigmund Hazel, MD  Outpatient Specialists: Orthopedics, Dr. Shelle Iron Brief Narrative: Luis Mccann is a 62 y.o.malewith a past medical history significant for paroxysmal atrial fibrillation, obstructive sleep apnea, hypertension and degenerative joint disease with chronic pain low back pain who presented to the emergency department for worsening lower back pain, left hip pain and difficulty walking. He expressed about 3 weeks of worsening low back pain for which PM&R had been following, given epidural injection, also evaluated by Dr. Federico Flake) who performed left hip trochanteric injection. Symptoms persisted so MRI of the lumbar spine and left hip were performed and demonstrated L3-L4 discitis, osteomyelitis extending to the left paraspinal musculature/psoas. Dr. Venetia Maxon was consulted by EDP and reportedly felt no surgical intervention was indicated. He was directed to the ED, reported some chills but no fevers. He was admitted for IV pain medications and antibiotics, IR consulted and performed CT-guided aspiration for culture which has grown Haemophilus parainfluenzae. Ceftriaxone has been continued, and ID is consulted for further recommendations.    Assessment & Plan: Principal Problem:   Discitis of lumbar region Active Problems:   Paroxysmal atrial fibrillation (HCC)   HTN (hypertension)   OSA (obstructive sleep apnea)   Discitis  Discitis/osteomyelitis with paraspinal abscess at L3-L4: s/p aspiration with culture growing beta-lactamase-negative H. parainfluenzae.  - Given vancomycin/zosyn x1, then CTX. Continue ceftriaxone for now, will need ID input regarding duration and definitive agent. Leukocytosis has resolved, pt has remained afebrile.  - Monitor blood cultures (NG3D) - Still requiring many doses of IV morphine in addition to po medications and pain is intractable. Will start oxycodone po prn with  continued morphine IV available for breakthrough pain.  - Continue tylenol, diclofenac, neurontin, antispasmodic for chronic low back pain.  Left hip pain, suspected due to L3 foraminal stenosis:  - Discussed at length with Dr. Shelle Iron who plans to discuss with spine surgeon, Dr. Shon Baton. With no myelopathy, will manage supportively for now and follow up as outpatient. May end up needing the facet out and fusion but certainly would not do this in the setting of active infection.   PAF: Currently NSR. CHA2DS2-VASc score of 1, not on home stroke prevention.  - Consider adding aspirin when outside scope of illness.   Resistant HTN: Controlled.  - Continue norvasc, ARB, HCTZ, and spironolactone.   OSA:  - CPAP qHS  DVT prophylaxis: SCDs Code Status: Full Family Communication: None at bedside Disposition Plan: Unclear. Continues to require significant amount of IV pain medications and pain remains intractable. Significantly impacting mobility to the point that I doubt he would be safe for discharge home.   Consultants:   IR  ID  Orthopedics, Dr. Shelle Iron  Procedures:   CT-guided aspiration 9/21 by Dr. Fredia Sorrow  Antimicrobials:  Vancomycin, zosyn x1 9/20  Ceftriaxone 9/21 >>  Subjective: Pain is 9/10, sharp, shooting down lateral left hip wrapping anteriorly on upper thigh with movement of the hip and back. Was able to get 4 steps to bedside chair with PT assistance yesterday but feels he's taken a step back.  Objective: Vitals:   01/21/18 1959 01/22/18 0033 01/22/18 0407 01/22/18 0815  BP: (!) 129/92 136/78 135/85 (!) 141/89  Pulse: (!) 59 (!) 55 63 61  Resp: 18 18 18 20   Temp: 98 F (36.7 C) 97.9 F (36.6 C) 98.3 F (36.8 C) 98.2 F (36.8 C)  TempSrc: Oral Oral Oral Oral  SpO2: 98% 100% 97% 99%  Weight:      Height:        Intake/Output Summary (Last 24 hours) at 01/22/2018 1242 Last data filed at 01/22/2018 1000 Gross per 24 hour  Intake -  Output 350 ml  Net -350  ml   Filed Weights   01/19/18 1244  Weight: 89.8 kg    Gen: 62 y.o. male in apparent discomfort Pulm: Non-labored breathing room air. Clear to auscultation bilaterally.  CV: Regular rate and rhythm. No murmur, rub, or gallop. No JVD, no pedal edema. GI: Abdomen soft, non-tender, non-distended, with normoactive bowel sounds. No organomegaly or masses felt. MSK: Pt defers full back exam due to pain at this time. Left hip with full ROM though FABER causes pain, no visible or palpable abnormality.  Skin: No rashes, lesions ulcers Neuro: Alert and oriented. No focal neurological deficits. Sensation intact to lower extremities, strength 5/5 throughout. Psych: Judgement and insight appear normal. Mood & affect appropriate.   Data Reviewed: I have personally reviewed following labs and imaging studies  CBC: Recent Labs  Lab 01/19/18 1357 01/20/18 0623  WBC 10.5 8.5  HGB 14.4 13.1  HCT 44.1 40.0  MCV 86.8 86.2  PLT 349 307   Basic Metabolic Panel: Recent Labs  Lab 01/19/18 1357 01/20/18 0623  NA 135 136  K 3.8 4.2  CL 98 103  CO2 27 21*  GLUCOSE 98 95  BUN 26* 21  CREATININE 1.17 0.95  CALCIUM 10.2 9.6   GFR: Estimated Creatinine Clearance: 86.7 mL/min (by C-G formula based on SCr of 0.95 mg/dL). Liver Function Tests: No results for input(s): AST, ALT, ALKPHOS, BILITOT, PROT, ALBUMIN in the last 168 hours. No results for input(s): LIPASE, AMYLASE in the last 168 hours. No results for input(s): AMMONIA in the last 168 hours. Coagulation Profile: Recent Labs  Lab 01/20/18 0910  INR 1.09   Cardiac Enzymes: No results for input(s): CKTOTAL, CKMB, CKMBINDEX, TROPONINI in the last 168 hours. BNP (last 3 results) No results for input(s): PROBNP in the last 8760 hours. HbA1C: No results for input(s): HGBA1C in the last 72 hours. CBG: No results for input(s): GLUCAP in the last 168 hours. Lipid Profile: No results for input(s): CHOL, HDL, LDLCALC, TRIG, CHOLHDL,  LDLDIRECT in the last 72 hours. Thyroid Function Tests: No results for input(s): TSH, T4TOTAL, FREET4, T3FREE, THYROIDAB in the last 72 hours. Anemia Panel: No results for input(s): VITAMINB12, FOLATE, FERRITIN, TIBC, IRON, RETICCTPCT in the last 72 hours. Urine analysis:    Component Value Date/Time   COLORURINE YELLOW 09/24/2013 1154   APPEARANCEUR CLEAR 09/24/2013 1154   LABSPEC 1.020 09/24/2013 1154   PHURINE 6.0 09/24/2013 1154   GLUCOSEU NEGATIVE 09/24/2013 1154   HGBUR TRACE (A) 09/24/2013 1154   BILIRUBINUR NEGATIVE 09/24/2013 1154   KETONESUR NEGATIVE 09/24/2013 1154   PROTEINUR NEGATIVE 09/24/2013 1154   UROBILINOGEN 0.2 09/24/2013 1154   NITRITE NEGATIVE 09/24/2013 1154   LEUKOCYTESUR TRACE (A) 09/24/2013 1154   Recent Results (from the past 240 hour(s))  Blood culture (routine x 2)     Status: None (Preliminary result)   Collection Time: 01/19/18  2:58 PM  Result Value Ref Range Status   Specimen Description BLOOD LEFT ANTECUBITAL  Final   Special Requests   Final    BOTTLES DRAWN AEROBIC AND ANAEROBIC Blood Culture adequate volume   Culture   Final    NO GROWTH 3 DAYS Performed at [facility], [address] [place], [place] [address]    Report  Status PENDING  Incomplete  Culture, blood (Routine X 2) w Reflex to ID Panel     Status: None (Preliminary result)   Collection Time: 01/19/18  3:03 PM  Result Value Ref Range Status   Specimen Description BLOOD RIGHT HAND  Final   Special Requests   Final    BOTTLES DRAWN AEROBIC AND ANAEROBIC Blood Culture results may not be optimal due to an excessive volume of blood received in culture bottles   Culture   Final    NO GROWTH 3 DAYS Performed at [facility], [address] [place], [place] [address]    Report Status PENDING  Incomplete  Aerobic/Anaerobic Culture (surgical/deep wound)     Status: None (Preliminary result)   Collection Time: 01/20/18 12:43 PM  Result Value Ref Range Status   Specimen  Description ABSCESS  Final   Special Requests Normal  Final   Gram Stain   Final    FEW WBC PRESENT,BOTH PMN AND MONONUCLEAR NO ORGANISMS SEEN Performed at [facility], [address] [place], [place] [address]    Culture   Final    RARE HAEMOPHILUS PARAINFLUENZAE BETA LACTAMASE NEGATIVE NO ANAEROBES ISOLATED; CULTURE IN PROGRESS FOR 5 DAYS    Report Status PENDING  Incomplete      Radiology Studies: Ct Aspiration  Result Date: 01/20/2018 INDICATION: Discitis at L3-4 with associated evidence by imaging of left-sided paraspinous infection extending into the paraspinous musculature and left psoas muscle. The patient presents for image guided aspiration for diagnostic purposes. EXAM: CT GUIDED ASPIRATION OF LEFT PARASPINOUS PSOAS MUSCLE COMPARISON:  MRI of the lumbar spine at [facility] on 01/18/2018. ANESTHESIA/SEDATION: Fentanyl 50 mcg IV; Versed 1.0 mg IV Moderate Sedation Time:  10 minutes. The patient was continuously monitored during the procedure by the interventional radiology nurse under my direct supervision. MEDICATIONS: No additional medications. COMPLICATIONS: None immediate. PROCEDURE: Informed written consent was obtained from the patient after a thorough discussion of the procedural risks, benefits and alternatives. All questions were addressed. Maximal Sterile Barrier Technique was utilized including caps, mask, sterile gowns, sterile gloves, sterile drape, hand hygiene and skin antiseptic. A timeout was performed prior to the initiation of the procedure. CT was performed in a prone position through the lumbar region. A 5 [place] Yueh centesis needle sheath catheter was advanced under CT guidance into the medial aspect of the left psoas muscle adjacent to the lumbar spine at the L3-4 level. Aspiration was performed. The centesis needle was repositioned twice during aspiration. Fluid sample was sent for culture analysis. FINDINGS: CT demonstrates destructive process  centered at the L3-4 level affecting primarily the left side of the disc space and extending into the adjacent left psoas muscle and posterior paraspinous musculature. There is edema in the psoas muscle with relative enlargement of the left psoas compared to the right. Aspiration at the level of central edema within the left medial psoas muscle yielded a total of 4 mL of bloody fluid. This was sent for culture analysis. No discrete drainable abscess was identified to warrant any drain placement at this time. IMPRESSION: CT-guided paraspinous aspiration at the L3-4 level within an edematous portion of the medial left ileo-psoas muscle abutting the L3-4 disc space. This yielded approximately 4 mL of bloody fluid which was sent for culture analysis. Electronically Signed   By: Irish LackGlenn  Yamagata M.D.   On: 01/20/2018 13:33    Scheduled Meds: . amLODipine  10 mg Oral Daily  . diclofenac  75  mg Oral BID WC  . ferrous sulfate  325 mg Oral Q M,W,F  . gabapentin  300 mg Oral TID  . irbesartan  300 mg Oral Daily   And  . hydrochlorothiazide  25 mg Oral Daily  . multivitamin with minerals  1 tablet Oral q morning - 10a  . spironolactone  25 mg Oral Daily  . vitamin C  1,000 mg Oral Daily   Continuous Infusions: . sodium chloride 100 mL/hr at 01/22/18 0133  . cefTRIAXone (ROCEPHIN)  IV 2 g (01/21/18 1602)     LOS: 3 days   Time spent: 25 minutes.  Tyrone Nine, MD [facility] [email or web address] Password [facility] 01/22/2018, 12:42 PM

## 2018-01-22 NOTE — Consult Note (Signed)
[facility] for Infectious Disease    Date of Admission:  01/19/2018   Total days of antibiotics: 3 ceftriaxone               Reason for Consult: Osteomyelitis, discitis    Referring Provider: Bonner Puna   Assessment: Disicitis, Osteo L3-4 H parainfluenza  Plan: 1. Continue ceftriaxone 2. Place PIC 3. Plan for 42 days 4. Appreciate ortho eval  5. Explained at length to pt and his spouse 6. Please have him f/u in ID clinic in 4 weeks.   Comment- Very unusual bacteria for this type of infection. It is hard to correlate to his injections as his pain predated and he is unclear as to exact dates of his injections (and as to which were toradol and which were steroids).  Available as needed.   Thank you so much for this interesting consult,  Principal Problem:   Discitis of lumbar region Active Problems:   Paroxysmal atrial fibrillation (HCC)   HTN (hypertension)   OSA (obstructive sleep apnea)   Discitis   . amLODipine  10 mg Oral Daily  . diclofenac  75 mg Oral BID WC  . ferrous sulfate  325 mg Oral Q M,W,F  . gabapentin  300 mg Oral TID  . irbesartan  300 mg Oral Daily   And  . hydrochlorothiazide  25 mg Oral Daily  . multivitamin with minerals  1 tablet Oral q morning - 10a  . spironolactone  25 mg Oral Daily  . vitamin C  1,000 mg Oral Daily    HPI: Luis Mccann is a 62 y.o. male with hx of parox afib and OSA comes to [facility] on 9-20 with several weeks of back pain. He was seen by PM&R and ortho and received steroids injections into his back and his L hip respectively. He is unclear of exact dates.  He came to ED after MRI of L-spine and hip showed L3-4 discitis/osteo, paraspinous and psoas infection. He was started on vanco/zosyn. He underwent IR aspirate on 9-21 which has since grown Haemophilus parainfluenza.  His anbx were changed to ceftriaxone.   CRP 9.5 ESR 60  He complains that he is unable to wal more than 3 steps due to back pain.   Review of  Systems: Review of Systems  Constitutional: Positive for chills. Negative for fever.  Respiratory: Negative for cough.   Gastrointestinal: Negative for constipation and diarrhea.  Genitourinary: Negative for dysuria.  Musculoskeletal: Positive for back pain.  Neurological: Negative for sensory change and focal weakness.  Please see HPI. All other systems reviewed and negative.   Past Medical History:  Diagnosis Date  . Arthritis    osteoarthritis  . Dysrhythmia 2000   single episode of afib with RVR with normal echo and rare atrial/ventricular ectopy on f/u Holter ([facility])  . History of kidney stones   . Hypertension   . Obesity (BMI 30.0-34.9) 04/04/2013  . Pneumonia    hx of age 12  . Severe obstructive sleep apnea    do not wear mask/    Social History   Tobacco Use  . Smoking status: Former Smoker    Types: Cigarettes  . Smokeless tobacco: Never Used  . Tobacco comment: quit 30 years ago-social smoker  Substance Use Topics  . Alcohol use: Yes    Comment: occasional  . Drug use: No    Family History  Problem Relation Age of Onset  . Cancer Sister 78  Multiple myeloma     Medications:  Scheduled: . amLODipine  10 mg Oral Daily  . diclofenac  75 mg Oral BID WC  . ferrous sulfate  325 mg Oral Q M,W,F  . gabapentin  300 mg Oral TID  . irbesartan  300 mg Oral Daily   And  . hydrochlorothiazide  25 mg Oral Daily  . multivitamin with minerals  1 tablet Oral q morning - 10a  . spironolactone  25 mg Oral Daily  . vitamin C  1,000 mg Oral Daily    Abtx:  Anti-infectives (From admission, onward)   Start     Dose/Rate Route Frequency Ordered Stop   01/20/18 1500  cefTRIAXone (ROCEPHIN) 2 g in sodium chloride 0.9 % 100 mL IVPB     2 g 200 mL/hr over 30 Minutes Intravenous Every 24 hours 01/20/18 1346     01/20/18 0430  vancomycin (VANCOCIN) IVPB 1000 mg/200 mL premix  Status:  Discontinued     1,000 mg 200 mL/hr over 60 Minutes Intravenous Every 12 hours  01/19/18 1542 01/19/18 1711   01/19/18 2200  piperacillin-tazobactam (ZOSYN) IVPB 3.375 g  Status:  Discontinued     3.375 g 12.5 mL/hr over 240 Minutes Intravenous Every 8 hours 01/19/18 1542 01/19/18 1711   01/19/18 1530  vancomycin (VANCOCIN) 1,750 mg in sodium chloride 0.9 % 500 mL IVPB     1,750 mg 250 mL/hr over 120 Minutes Intravenous  Once 01/19/18 1507 01/19/18 1811   01/19/18 1515  piperacillin-tazobactam (ZOSYN) IVPB 3.375 g     3.375 g 100 mL/hr over 30 Minutes Intravenous  Once 01/19/18 1507 01/19/18 1711        OBJECTIVE: Blood pressure 129/90, pulse 68, temperature 98.8 F (37.1 C), temperature source Oral, resp. rate 16, height '5\' 8"'  (1.727 m), weight 89.8 kg, SpO2 97 %.  Physical Exam  Constitutional: He is oriented to person, place, and time. He appears well-developed and well-nourished.  HENT:  Mouth/Throat: No oropharyngeal exudate.  Eyes: Pupils are equal, round, and reactive to light. EOM are normal.  Neck: Normal range of motion. Neck supple.  Cardiovascular: Normal rate, regular rhythm and normal heart sounds.  Pulmonary/Chest: Effort normal and breath sounds normal.  Abdominal: Soft. Bowel sounds are normal. He exhibits no distension. There is no tenderness.  Musculoskeletal: He exhibits no edema.  Lymphadenopathy:    He has no cervical adenopathy.  Neurological: He is alert and oriented to person, place, and time. No sensory deficit.  Psychiatric: He has a normal mood and affect.    Lab Results No results found for this or any previous visit (from the past 48 hour(s)).    Component Value Date/Time   SDES ABSCESS 01/20/2018 1243   SPECREQUEST Normal 01/20/2018 1243   CULT  01/20/2018 1243    RARE HAEMOPHILUS PARAINFLUENZAE BETA LACTAMASE NEGATIVE NO ANAEROBES ISOLATED; CULTURE IN PROGRESS FOR 5 DAYS    REPTSTATUS PENDING 01/20/2018 1243   No results found. Recent Results (from the past 240 hour(s))  Blood culture (routine x 2)     Status:  None (Preliminary result)   Collection Time: 01/19/18  2:58 PM  Result Value Ref Range Status   Specimen Description BLOOD LEFT ANTECUBITAL  Final   Special Requests   Final    BOTTLES DRAWN AEROBIC AND ANAEROBIC Blood Culture adequate volume   Culture   Final    NO GROWTH 3 DAYS Performed at [facility], [address] [place], [place] [address]  Report Status PENDING  Incomplete  Culture, blood (Routine X 2) w Reflex to ID Panel     Status: None (Preliminary result)   Collection Time: 01/19/18  3:03 PM  Result Value Ref Range Status   Specimen Description BLOOD RIGHT HAND  Final   Special Requests   Final    BOTTLES DRAWN AEROBIC AND ANAEROBIC Blood Culture results may not be optimal due to an excessive volume of blood received in culture bottles   Culture   Final    NO GROWTH 3 DAYS Performed at [facility], [place] [address], [place], [place] [address]    Report Status PENDING  Incomplete  Aerobic/Anaerobic Culture (surgical/deep wound)     Status: None (Preliminary result)   Collection Time: 01/20/18 12:43 PM  Result Value Ref Range Status   Specimen Description ABSCESS  Final   Special Requests Normal  Final   Gram Stain   Final    FEW WBC PRESENT,BOTH PMN AND MONONUCLEAR NO ORGANISMS SEEN Performed at [facility], [address] [place], [place] [address]    Culture   Final    RARE HAEMOPHILUS PARAINFLUENZAE BETA LACTAMASE NEGATIVE NO ANAEROBES ISOLATED; CULTURE IN PROGRESS FOR 5 DAYS    Report Status PENDING  Incomplete    Microbiology: Recent Results (from the past 240 hour(s))  Blood culture (routine x 2)     Status: None (Preliminary result)   Collection Time: 01/19/18  2:58 PM  Result Value Ref Range Status   Specimen Description BLOOD LEFT ANTECUBITAL  Final   Special Requests   Final    BOTTLES DRAWN AEROBIC AND ANAEROBIC Blood Culture adequate volume   Culture   Final    NO GROWTH 3 DAYS Performed at [facility], [place] [address], [place], [place] [address]    Report Status PENDING  Incomplete  Culture, blood (Routine X 2) w Reflex to ID Panel     Status: None (Preliminary result)   Collection Time: 01/19/18  3:03 PM  Result Value Ref Range Status   Specimen Description BLOOD RIGHT HAND  Final   Special Requests   Final    BOTTLES DRAWN AEROBIC AND ANAEROBIC Blood Culture results may not be optimal due to an excessive volume of blood received in culture bottles   Culture   Final    NO GROWTH 3 DAYS Performed at [facility], [place] [address] [place], [place] [address]    Report Status PENDING  Incomplete  Aerobic/Anaerobic Culture (surgical/deep wound)     Status: None (Preliminary result)   Collection Time: 01/20/18 12:43 PM  Result Value Ref Range Status   Specimen Description ABSCESS  Final   Special Requests Normal  Final   Gram Stain   Final    FEW WBC PRESENT,BOTH PMN AND MONONUCLEAR NO ORGANISMS SEEN Performed at [facility], [address] [place], [place] [address]    Culture   Final    RARE HAEMOPHILUS PARAINFLUENZAE BETA LACTAMASE NEGATIVE NO ANAEROBES ISOLATED; CULTURE IN PROGRESS FOR 5 DAYS    Report Status PENDING  Incomplete    Radiographs and labs were personally reviewed by me.    No Known Allergies  OPAT Orders Discharge antibiotics: ceftriaxione 2g qday  Duration: 39 days End Date: 03-02-18  [facility] Care Per Protocol: please  Labs weekly while on IV antibiotics: _x_ CBC with differential __ BMP _x_ CMP _x_ CRP _x_ ESR __ Vancomycin trough  _x_ Please pull PIC  at completion of IV antibiotics __ Please leave PIC in place until doctor has seen patient or been notified  Fax weekly labs to [phone]  Clinic Follow Up Appt: 4 weeks   Bobby Rumpf, MD [facility] [phone] 01/22/2018, 3:51 PM

## 2018-01-22 NOTE — Progress Notes (Signed)
PHARMACY CONSULT NOTE FOR:  OUTPATIENT  PARENTERAL ANTIBIOTIC THERAPY (OPAT)  Indication: Osteomyelitis/Discitis Regimen: Rocephin 2g IV every 24 hours End date: 03/02/18  IV antibiotic discharge orders are pended. To discharging provider:  please sign these orders via discharge navigator,  Select New Orders & click on the button choice - Manage This Unsigned Work.     Thank you for allowing pharmacy to be a part of this patient's care.  Georgina PillionElizabeth Ladashia Mccann, PharmD, BCPS Pager: [phone] 5:31 PM

## 2018-01-22 NOTE — Progress Notes (Signed)
Physical Therapy Treatment Patient Details Name: Luis Mccann MRN: [id number] DOB: 12/29/1955 Today's Date: 01/22/2018    History of Present Illness 62 y.o.malewith a past medical history significant for paroxysmal atrial fibrillation, obstructive sleep apnea, hypertension and degenerative joint disease with chronic pain; who presented to the emergency department secondary to worsening lower back pain and difficulty walking. Patient expressed approximately 2 to 3 weeks of discomfort in his lower back and during this time has been evaluated by Dr. Educational psychologist) with intended relief for his pain after applying epidural shot in his back. Patient reports no improvement whatsoever on his symptoms, which led physician to perform an MRI of his back,demonstrating acute discitis/osteomyelitis and abscess at L3-L4 andpsoas muscles. CT-guided aspiration per IR has been performed; cultures and fluid analysis pending    PT Comments    Pt supine in bed frustrated with pain, patient apprehensive to move in fear his pain would not be controlled after mobility.  RN reports patient can have IV meds post session.  Pt agreeable to mobility.  Pt requiring min assistance overall with noticeable weakness in UE/LEs.  Pt concerned to return home and not be able to function.  Will continue PT per POC.    Follow Up Recommendations  Home health PT;Supervision/Assistance - 24 hour     Equipment Recommendations  (pretty well equipped.  )    Recommendations for Other Services       Precautions / Restrictions Precautions Precautions: Back Precaution Comments: Back precautions for comfort with getting up Restrictions Weight Bearing Restrictions: No    Mobility  Bed Mobility Overal bed mobility: Needs Assistance Bed Mobility: Rolling;Sidelying to Sit Rolling: Min assist Sidelying to sit: Min assist       General bed mobility comments: min assist to roll and asend into sitting given verbal and  tactile cueing for log roll technique  Transfers Overall transfer level: Needs assistance Equipment used: Rolling walker (2 wheeled) Transfers: Sit to/from Stand Sit to Stand: Min assist         General transfer comment: Cues for hand placement to and from seated surface.  Pt fatigues quickly with activity and required assistance to eccentrically load to seated surface.    Ambulation/Gait Ambulation/Gait assistance: Min assist Gait Distance (Feet): 120 Feet Assistive device: Rolling walker (2 wheeled) Gait Pattern/deviations: Step-through pattern;Decreased stride length;Shuffle;Trunk flexed     General Gait Details: Cues for upper trunk control and forward gaze.     Stairs             Wheelchair Mobility    Modified Rankin (Stroke Patients Only)       Balance Overall balance assessment: Needs assistance   Sitting balance-[name]: Fair       Standing balance-[name]: Poor                              Cognition Arousal/Alertness: Awake/alert Behavior During Therapy: WFL for tasks assessed/performed;Anxious Overall Cognitive Status: Within Functional Limits for tasks assessed                                 General Comments: easily distracted, very anxious.        Exercises      General Comments        Pertinent Vitals/Pain Pain Assessment: 0-10 Pain Score: 8  Pain Location: Low back and L hip, wrapping around outside of  hip anteriorly with attempts at standing Pain Descriptors / Indicators: Grimacing;Guarding;Discomfort;Aching Pain Intervention(s): Monitored during session;Repositioned;Patient requesting pain meds-RN notified;RN gave pain meds during session    Home Living                      Prior Function            PT Goals (current goals can now be found in the care plan section) Acute Rehab PT Goals Patient Stated Goal: less pain, back to normal Potential to Achieve Goals: Fair Progress  towards PT goals: Progressing toward goals    Frequency    Min 4X/week      PT Plan Current plan remains appropriate    Co-evaluation              AM-PAC PT "6 Clicks" Daily Activity  Outcome Measure  Difficulty turning over in bed (including adjusting bedclothes, sheets and blankets)?: A Little Difficulty moving from lying on back to sitting on the side of the bed? : A Lot Difficulty sitting down on and standing up from a chair with arms (e.g., wheelchair, bedside commode, etc,.)?: Unable Help needed moving to and from a bed to chair (including a wheelchair)?: A Little Help needed walking in hospital room?: A Little Help needed climbing 3-5 steps with a railing? : A Lot 6 Click Score: 14    End of Session Equipment Utilized During Treatment: Gait belt Activity Tolerance: Patient limited by pain Patient left: in chair;with call bell/phone within reach Nurse Communication: Mobility status;Other (comment);Patient requests pain meds PT Visit Diagnosis: Unsteadiness on feet (R26.81);Other abnormalities of gait and mobility (R26.89);Pain Pain - Right/Left: Left Pain - part of body: Hip(and low back)     Time: [id number] PT Time Calculation (min) (ACUTE ONLY): 28 min  Charges:  $Gait Training: 8-22 mins $Therapeutic Activity: 8-22 mins                     Joycelyn RuaAimee Lizette Pazos, PTA Acute Rehabilitation Services Pager [phone] Office [phone]     Tymothy Cass Artis DelayJ Islay Polanco 01/22/2018, 5:52 PM

## 2018-01-23 ENCOUNTER — Inpatient Hospital Stay (HOSPITAL_COMMUNITY): Payer: BC Managed Care – PPO

## 2018-01-23 NOTE — Progress Notes (Signed)
[facility]  [facility] will follow pt with ID team to support home infusion pharmacy services for  IV ABX at DC.  If patient discharges after hours, please call [phone].   Sedalia Mutaamela S Chandler 01/23/2018, 8:30 AM

## 2018-01-23 NOTE — Progress Notes (Signed)
PROGRESS NOTE  SALMAN WELLEN  ZOX:[id number] DOB: 04-08-1956 DOA: 01/19/2018 PCP: Sigmund Hazel, MD  Outpatient Specialists: Orthopedics, Dr. Shelle Iron Brief Narrative: Luis Mccann is a 62 y.o.malewith a past medical history significant for paroxysmal atrial fibrillation, obstructive sleep apnea, hypertension and degenerative joint disease with chronic pain low back pain who presented to the emergency department for worsening lower back pain, left hip pain and difficulty walking. He expressed about 3 weeks of worsening low back pain for which PM&R had been following, given epidural injection, also evaluated by Dr. Federico Flake) who performed left hip trochanteric injection. Symptoms persisted so MRI of the lumbar spine and left hip were performed and demonstrated L3-L4 discitis, osteomyelitis extending to the left paraspinal musculature/psoas. Dr. Venetia Maxon was consulted by EDP and reportedly felt no surgical intervention was indicated. He was directed to the ED, reported some chills but no fevers. He was admitted for IV pain medications and antibiotics, IR consulted and performed CT-guided aspiration for culture which has grown Haemophilus parainfluenzae. Ceftriaxone has been continued, and ID is consulted for further recommendations.    Assessment & Plan: Principal Problem:   Discitis of lumbar region Active Problems:   Paroxysmal atrial fibrillation (HCC)   HTN (hypertension)   OSA (obstructive sleep apnea)   Discitis  Discitis/osteomyelitis with paraspinal abscess at L3-L4: s/p aspiration with culture growing beta-lactamase-negative H. parainfluenzae.  - Given vancomycin/zosyn x1, then CTX. Continue ceftriaxone x42 days (PICC to be inserted today) per ID. CM consulted. Leukocytosis has resolved, pt has remained afebrile.  - Monitor blood cultures (NG4D) - Still required significant number of IV morphine doses. Very anxious about going home with stairs and limited assistance from wife. Continue  oxycodone at this dose as pt reports drowsiness. Aim to decrease IV meds communicated today.  - Continue adjunctive Tx: tylenol, diclofenac, neurontin, antispasmodic for chronic low back pain.  Left hip pain, suspected due to L3 foraminal stenosis:  - Per orthopedics: spine surgeon, Dr. Shon Baton to evaluate and provide progress note regarding management options and plan, possible decompression and fusion.  PAF: Currently NSR. CHA2DS2-VASc score of 1, not on home stroke prevention.  - Consider adding aspirin when outside scope of illness.   Resistant HTN: Controlled.  - Continue norvasc, ARB, HCTZ, and spironolactone.   OSA:  - CPAP qHS  DVT prophylaxis: SCDs Code Status: Full Family Communication: None at bedside Disposition Plan: Home with home health once more functional and able to control pain with po pain medications.   Consultants:   IR  ID  Orthopedics, Dr. Shelle Iron; Dr. Shon Baton to evaluate.  Procedures:   CT-guided aspiration 9/21 by Dr. Fredia Sorrow  Antimicrobials:  Vancomycin, zosyn x1 9/20  Ceftriaxone 9/21 >>  Subjective: Nervous about returning home, has severe pain which is better controlled on increased oxycodone but still used IV morphine 5 times in the previous 24 hours. Had BM. Has not tried stairs with PT/OT yet and has 13 of them at home.  Objective: Vitals:   01/22/18 1938 01/22/18 2318 01/23/18 0307 01/23/18 0919  BP: 132/80 (!) 141/77 (!) 143/91 137/88  Pulse: 67 63 68 61  Resp: 18 18 18 20   Temp: 100 F (37.8 C) 99 F (37.2 C) 99.9 F (37.7 C) (!) 97.4 F (36.3 C)  TempSrc: Oral Oral Oral Oral  SpO2: 96% 97% 97% 97%  Weight:      Height:        Intake/Output Summary (Last 24 hours) at 01/23/2018 1540 Last data filed at 01/23/2018 1000 Gross  per 24 hour  Intake 1440 ml  Output 3300 ml  Net -1860 ml   Filed Weights   01/19/18 1244  Weight: 89.8 kg   Gen: 62 y.o. male in no distress Pulm: Nonlabored breathing room air. Clear. CV:  Regular rate and rhythm. No murmur, rub, or gallop. No JVD, no dependent edema. GI: Abdomen soft, non-tender, non-distended, with normoactive bowel sounds.  Ext: Warm, no deformities. Back exam with midline tenderness and paraspinal lumbar spasm. No visible or palpable abnormality in left hip. Skin: No rashes, lesions or ulcers on visualized skin.  Neuro: Alert and oriented. No focal neurological deficits. Psych: Judgement and insight appear fair. Mood anxious & affect congruent, broad, labile.  Data Reviewed: I have personally reviewed following labs and imaging studies  CBC: Recent Labs  Lab 01/19/18 1357 01/20/18 0623  WBC 10.5 8.5  HGB 14.4 13.1  HCT 44.1 40.0  MCV 86.8 86.2  PLT 349 307   Basic Metabolic Panel: Recent Labs  Lab 01/19/18 1357 01/20/18 0623  NA 135 136  K 3.8 4.2  CL 98 103  CO2 27 21*  GLUCOSE 98 95  BUN 26* 21  CREATININE 1.17 0.95  CALCIUM 10.2 9.6   GFR: Estimated Creatinine Clearance: 86.7 mL/min (by C-G formula based on SCr of 0.95 mg/dL). Liver Function Tests: No results for input(s): AST, ALT, ALKPHOS, BILITOT, PROT, ALBUMIN in the last 168 hours. No results for input(s): LIPASE, AMYLASE in the last 168 hours. No results for input(s): AMMONIA in the last 168 hours. Coagulation Profile: Recent Labs  Lab 01/20/18 0910  INR 1.09   Cardiac Enzymes: No results for input(s): CKTOTAL, CKMB, CKMBINDEX, TROPONINI in the last 168 hours. BNP (last 3 results) No results for input(s): PROBNP in the last 8760 hours. HbA1C: No results for input(s): HGBA1C in the last 72 hours. CBG: No results for input(s): GLUCAP in the last 168 hours. Lipid Profile: No results for input(s): CHOL, HDL, LDLCALC, TRIG, CHOLHDL, LDLDIRECT in the last 72 hours. Thyroid Function Tests: No results for input(s): TSH, T4TOTAL, FREET4, T3FREE, THYROIDAB in the last 72 hours. Anemia Panel: No results for input(s): VITAMINB12, FOLATE, FERRITIN, TIBC, IRON, RETICCTPCT in  the last 72 hours. Urine analysis:    Component Value Date/Time   COLORURINE YELLOW 09/24/2013 1154   APPEARANCEUR CLEAR 09/24/2013 1154   LABSPEC 1.020 09/24/2013 1154   PHURINE 6.0 09/24/2013 1154   GLUCOSEU NEGATIVE 09/24/2013 1154   HGBUR TRACE (A) 09/24/2013 1154   BILIRUBINUR NEGATIVE 09/24/2013 1154   KETONESUR NEGATIVE 09/24/2013 1154   PROTEINUR NEGATIVE 09/24/2013 1154   UROBILINOGEN 0.2 09/24/2013 1154   NITRITE NEGATIVE 09/24/2013 1154   LEUKOCYTESUR TRACE (A) 09/24/2013 1154   Recent Results (from the past 240 hour(s))  Blood culture (routine x 2)     Status: None (Preliminary result)   Collection Time: 01/19/18  2:58 PM  Result Value Ref Range Status   Specimen Description BLOOD LEFT ANTECUBITAL  Final   Special Requests   Final    BOTTLES DRAWN AEROBIC AND ANAEROBIC Blood Culture adequate volume   Culture   Final    NO GROWTH 4 DAYS Performed at [facility], [address] [place], [place] [address]    Report Status PENDING  Incomplete  Culture, blood (Routine X 2) w Reflex to ID Panel     Status: None (Preliminary result)   Collection Time: 01/19/18  3:03 PM  Result Value Ref Range Status   Specimen Description BLOOD RIGHT HAND  Final   Special Requests   Final    BOTTLES DRAWN AEROBIC AND ANAEROBIC Blood Culture results may not be optimal due to an excessive volume of blood received in culture bottles   Culture   Final    NO GROWTH 4 DAYS Performed at [facility], [address] [place], [place] [address]    Report Status PENDING  Incomplete  Aerobic/Anaerobic Culture (surgical/deep wound)     Status: None (Preliminary result)   Collection Time: 01/20/18 12:43 PM  Result Value Ref Range Status   Specimen Description ABSCESS  Final   Special Requests Normal  Final   Gram Stain   Final    FEW WBC PRESENT,BOTH PMN AND MONONUCLEAR NO ORGANISMS SEEN Performed at [facility], [address] [place], [place] [address]    Culture    Final    RARE HAEMOPHILUS PARAINFLUENZAE BETA LACTAMASE NEGATIVE NO ANAEROBES ISOLATED; CULTURE IN PROGRESS FOR 5 DAYS    Report Status PENDING  Incomplete      Radiology Studies: [place] Ekg Site Rite  Result Date: 01/22/2018 If Site Rite image not attached, placement could not be confirmed due to current cardiac rhythm.   Scheduled Meds: . amLODipine  10 mg Oral Daily  . diclofenac  75 mg Oral BID WC  . ferrous sulfate  325 mg Oral Q M,W,F  . gabapentin  300 mg Oral TID  . irbesartan  300 mg Oral Daily   And  . hydrochlorothiazide  25 mg Oral Daily  . multivitamin with minerals  1 tablet Oral q morning - 10a  . spironolactone  25 mg Oral Daily  . vitamin C  1,000 mg Oral Daily   Continuous Infusions: . sodium chloride 100 mL/hr at 01/23/18 0104  . cefTRIAXone (ROCEPHIN)  IV 2 g (01/23/18 1448)     LOS: 4 days   Time spent: 25 minutes.  Tyrone Nine, MD [facility] [email or web address] Password [facility] 01/23/2018, 3:40 PM

## 2018-01-23 NOTE — Progress Notes (Signed)
Physical Therapy Treatment Patient Details Name: Luis Mccann MRN: [id number] DOB: [birth year] Today's Date: 01/23/2018    History of Present Illness Pt is a 62 y.o. male admitted 01/19/18  for worsening lower back pain and difficulty walking. MRI showed acute discitis/osteomyelitis and abscess at L3-4 and psoas muscles. CT-guided aspiration performed by IR. Per ortho, may eventually require L3-4 decompression and fusion. PMH includes a-fib, OSA, HTN, DJD, chronic pain.   PT Comments    Pt progressing with mobility. Able to perform ADLs and ambulation with RW at supervision-level; remains limited by c/o significant L lower back and hip pain. Will plan for stair training next session as pt has 2 steps into home and flight of stairs to bedroom.   Follow Up Recommendations  Home health PT;Supervision/Assistance - 24 hour     Equipment Recommendations  None recommended by PT    Recommendations for Other Services       Precautions / Restrictions Precautions Precautions: (P) Back;Fall Precaution Comments: (P) Back precautions for comfort; MD to order aspen brace Required Braces or Orthoses: (P) (no brace in room) Restrictions Weight Bearing Restrictions: No    Mobility  Bed Mobility Overal bed mobility: Modified Independent Bed Mobility: Rolling;Sidelying to Sit           General bed mobility comments: Able to perform log roll with bed flat and no bed rail; mod indep due to increased time and effort secondary to pain. Prefers to roll towards R-side  Transfers Overall transfer level: Needs assistance Equipment used: Rolling walker (2 wheeled) Transfers: Sit to/from Stand Sit to Stand: Supervision         General transfer comment: Pt able to self-correct hand placement on RW; c/o LLE pain worsening when attempting to stand. Supervision for safety.  Ambulation/Gait Ambulation/Gait assistance: Min guard Gait Distance (Feet): 350 Feet Assistive device: Rolling walker (2  wheeled) Gait Pattern/deviations: Step-through pattern;Decreased stride length;Decreased weight shift to left;Trunk flexed Gait velocity: Decreased Gait velocity interpretation: 1.31 - 2.62 ft/sec, indicative of limited community ambulator General Gait Details: Slow, steady amb with RW; supervision due to intermittent c/o sharp LLE pain causing pt to shy away from L side, although no knee buckling noted. 1x standing rest break leaning onto RW. RW readjusted to shorter height   Stairs Stairs: (Began education on stair technique with rails)           Wheelchair Mobility    Modified Rankin (Stroke Patients Only)       Balance Overall balance assessment: Needs assistance Sitting-balance support: No upper extremity supported;Feet supported Sitting balance-[name]: Good Sitting balance - Comments: Able to don bilat socks seated EOB (more difficult time with L side)   Standing balance support: Bilateral upper extremity supported;During functional activity Standing balance-[name]: Fair Standing balance comment: Can static stand with no UE support; reliant on BUE support for dynamic stability                            Cognition Arousal/Alertness: (P) Awake/alert Behavior During Therapy: (P) Anxious Overall Cognitive Status: (P) Within Functional Limits for tasks assessed                                 General Comments: (P) Anxious, tangential speech/questions, easily distracted      Exercises      General Comments        Pertinent Vitals/Pain  Pain Assessment: (P) 0-10 Pain Score: (P) 10-Worst pain ever Faces Pain Scale: Hurts even more Pain Location: (P)  L hip/anterior thigh with movement Pain Descriptors / Indicators: (P) Burning;Discomfort;Grimacing;Guarding;Moaning;Crying Pain Intervention(s): (P) Limited activity within patient's tolerance;RN gave pain meds during session;Repositioned    Home Living Family/patient expects to be  discharged to:: Private residence Living Arrangements: Spouse/significant other                  Prior Function            PT Goals (current goals can now be found in the care plan section) Acute Rehab PT Goals Patient Stated Goal: less pain, back to normal PT Goal Formulation: With patient Time For Goal Achievement: 02/04/18 Potential to Achieve Goals: Fair Progress towards PT goals: Progressing toward goals    Frequency    Min 4X/week      PT Plan Current plan remains appropriate    Co-evaluation              AM-PAC PT "6 Clicks" Daily Activity  Outcome Measure  Difficulty turning over in bed (including adjusting bedclothes, sheets and blankets)?: None Difficulty moving from lying on back to sitting on the side of the bed? : None Difficulty sitting down on and standing up from a chair with arms (e.g., wheelchair, bedside commode, etc,.)?: A Little Help needed moving to and from a bed to chair (including a wheelchair)?: A Little Help needed walking in hospital room?: A Little Help needed climbing 3-5 steps with a railing? : A Lot 6 Click Score: 19    End of Session Equipment Utilized During Treatment: Gait belt Activity Tolerance: Patient tolerated treatment well;Patient limited by pain Patient left: in chair;with call bell/phone within reach Nurse Communication: Mobility status PT Visit Diagnosis: Unsteadiness on feet (R26.81);Other abnormalities of gait and mobility (R26.89);Pain Pain - Right/Left: Left Pain - part of body: Hip     Time: [id number] PT Time Calculation (min) (ACUTE ONLY): 28 min  Charges:  $Gait Training: 8-22 mins $Therapeutic Activity: 8-22 mins                     Ina HomesJaclyn Sharilynn Cassity, PT, DPT Acute Rehabilitation Services  Pager [phone] Office [phone]  Malachy ChamberJaclyn L Cobi Aldape 01/23/2018, 2:01 PM

## 2018-01-23 NOTE — Progress Notes (Signed)
Patient declined CPAP states that he will not be here long enough to need one. No distress noted RCP will continue to follow.

## 2018-01-23 NOTE — Progress Notes (Signed)
Patient refused CPAP at this time.

## 2018-01-23 NOTE — Care Management Note (Signed)
Case Management Note  Patient Details  Name: Luis Mccann MRN: [id number] Date of Birth: 02/27/1956  Subjective/Objective:      Pt admitted with discitis of the lumbar region. He is from home with his spouse. PCP:   Dr Sabra Heck, Lattie Haw            Action/Plan: Plan is for home IV antibiotics. CM met with the patient and provided him choice. He selected [facility]. Pam with [facility] notified and she accepted the referral.  Plan is for patient to have PICC line placed today. CM following for further d/c needs.   Expected Discharge Date:                  Expected Discharge Plan:  [place]  In-House Referral:     Discharge planning Services  CM Consult  Post Acute Care Choice:  Home Health Choice offered to:  Patient  DME Arranged:    DME Agency:     HH Arranged:  RN, PT, OT [place] Agency:  [facility]  Status of Service:  In process, will continue to follow  If discussed at Long Length of Stay Meetings, dates discussed:    Additional Comments:  Pollie Friar, RN 01/23/2018, 2:39 PM

## 2018-01-23 NOTE — Progress Notes (Signed)
Patient ID: Luis Mccann, male   DOB: [birth year], 62 y.o.   MRN: [id number]   I spoke with the hospitalist who is now taking care of Luis Mccann yesterday.  We have discussed the case in his condition.  I also spoke with Luis Mccann.  Indicated to the hospitalist that I had referred the patient to the emergency room for definitive care for his suspected discitis.  That included admission by the hospitalist service consultation by infectious disease and consultation with the spine surgeon on call.  Apparently the spine surgeon was consulted it was deemed a nonsurgical case.  However I cannot find a note to that effect.   I did discuss the case with my partner Dr. Shon BatonBrooks.  He and I have reviewed the MRI.  It is likely following the resolution of this infection that the patient will require a decompression and fusion at L3-4 to open up the neural foramen.  It is likely symptomatic from his L3 nerve root getting compressed.  We discussed a variety of instrumented techniques.  Dr. Shon BatonBrooks has agreed to see Luis Mccann and discuss that with him to date and provide a progress note to that effect.  Also continued to identify the offending organism.  The initial preliminary identification was discussed.  Appreciate the excellent and expedited treatment that Luis Mccann has received from the medical service here at [facility].

## 2018-01-23 NOTE — Progress Notes (Signed)
Occupational Therapy Treatment Patient Details Name: Luis Mccann MRN: [id number] DOB: [birth year] Today's Date: 01/23/2018    History of present illness Pt is a 62 y.o. male admitted 01/19/18  for worsening lower back pain and difficulty walking. MRI showed acute discitis/osteomyelitis and abscess at L3-4 and psoas muscles. CT-guided aspiration performed by IR. Per ortho, may eventually require L3-4 decompression and fusion. PMH includes a-fib, OSA, HTN, DJD, chronic pain.   OT comments  Pt ambulated to bathroom and completed grooming at sink with minguard A @ RW level. Pt occasionally crying out in pain @ L hip/thigh area. Encouraged pt to take pain meds previously offered by nurse. Encourage ambulation as tolerated. Pt requires mod A with LB ADL due to pain - may benefit from use of AE. Will continue to follow acutely and recommend HHOT for follow up.   Follow Up Recommendations  Home health OT;Supervision - Intermittent    Equipment Recommendations  3 in 1 bedside commode    Recommendations for Other Services      Precautions / Restrictions Precautions Precautions: Back;Fall Precaution Comments: Back precautions for comfort; MD to order aspen brace Required Braces or Orthoses: (no brace in room) Restrictions Weight Bearing Restrictions: No       Mobility Bed Mobility Overal bed mobility: Modified Independent Bed Mobility: Rolling;Sidelying to Sit           General bed mobility comments: Able to perform log roll with bed flat and no bed rail; mod indep due to increased time and effort secondary to pain. Prefers to roll towards R-side  Transfers Overall transfer level: Needs assistance Equipment used: Rolling walker (2 wheeled) Transfers: Sit to/from Stand Sit to Stand: Supervision         General transfer comment: vc for hand placement    Balance Overall balance assessment: Needs assistance Sitting-balance support: No upper extremity supported;Feet  supported Sitting balance-[name]: Good    Standing balance support: Bilateral upper extremity supported;During functional activity Standing balance-[name]: Fair Standing balance comment: leans over counter to complete ADL                           ADL either performed or assessed with clinical judgement   ADL Overall ADL's : Needs assistance/impaired     Grooming: Set up;Standing       Lower Body Bathing: Minimal assistance;Sit to/from stand       Lower Body Dressing: Moderate assistance;Sit to/from stand Lower Body Dressing Details (indicate cue type and reason): unable to complete figure four position Toilet Transfer: Minimal assistance;RW;Ambulation   Toileting- Clothing Manipulation and Hygiene: Minimal assistance       Functional mobility during ADLs: Minimal assistance;Rolling walker General ADL Comments: Pt talking and ambulating then crying out in pain; tangential     Vision       Perception     Praxis      Cognition Arousal/Alertness: Awake/alert Behavior During Therapy: Anxious Overall Cognitive Status: Within Functional Limits for tasks assessed                                 General Comments: Anxious, tangential speech/questions, easily distracted        Exercises     Shoulder Instructions       General Comments      Pertinent Vitals/ Pain       Pain Assessment: 0-10 Pain Score: 10-Worst  pain ever Faces Pain Scale: Hurts even more Pain Location:  L hip/anterior thigh with movement Pain Descriptors / Indicators: Burning;Discomfort;Grimacing;Guarding;Moaning;Crying Pain Intervention(s): Limited activity within patient's tolerance;RN gave pain meds during session;Repositioned  Home Living Family/patient expects to be discharged to:: Private residence Living Arrangements: Spouse/significant other                                      Prior Functioning/Environment               Frequency  Min 2X/week        Progress Toward Goals  OT Goals(current goals can now be found in the care plan section)  Progress towards OT goals: Progressing toward goals  Acute Rehab OT Goals Patient Stated Goal: less pain, back to normal OT Goal Formulation: With patient Time For Goal Achievement: 02/04/18 Potential to Achieve Goals: Good ADL Goals Pt Will Perform Grooming: with supervision;standing Pt Will Perform Lower Body Dressing: with supervision;sit to/from stand;with adaptive equipment Pt Will Transfer to Toilet: with supervision;ambulating;bedside commode Pt Will Perform Toileting - Clothing Manipulation and hygiene: with supervision;sit to/from stand Pt Will Perform Tub/Shower Transfer: Tub transfer;shower seat;rolling walker;ambulating;with supervision Additional ADL Goal #1: Pt will engage in ADL routine with pain less than 4/10, using AE/modified techniques as needed.  Plan Discharge plan remains appropriate;Frequency remains appropriate    Co-evaluation                 AM-PAC PT "6 Clicks" Daily Activity     Outcome Measure   Help from another person eating meals?: None Help from another person taking care of personal grooming?: None Help from another person toileting, which includes using toliet, bedpan, or urinal?: A Little Help from another person bathing (including washing, rinsing, drying)?: A Little Help from another person to put on and taking off regular upper body clothing?: None Help from another person to put on and taking off regular lower body clothing?: A Little 6 Click Score: 21    End of Session Equipment Utilized During Treatment: Gait belt;Rolling walker  OT Visit Diagnosis: Other abnormalities of gait and mobility (R26.89);Muscle weakness (generalized) (M62.81);Pain Pain - Right/Left: Left Pain - part of body: Hip;Leg   Activity Tolerance Patient tolerated treatment well   Patient Left in chair;with call bell/phone within  reach;with nursing/sitter in room   Nurse Communication Mobility status(encourage ambulation)        Time: [id number] OT Time Calculation (min): 26 min  Charges: OT General Charges $OT Visit: 1 Visit OT Treatments $Self Care/Home Management : 23-37 mins  [name], OT/L   Acute OT Clinical Specialist Acute Rehabilitation Services Pager [phone] Office [phone]    [facility],HILLARY 01/23/2018, 2:03 PM

## 2018-01-24 LAB — CULTURE, BLOOD (ROUTINE X 2)
CULTURE: NO GROWTH
Culture: NO GROWTH
Special Requests: ADEQUATE

## 2018-01-24 MED ORDER — OXYCODONE HCL 5 MG PO TABS: 10.0000 mg | ORAL_TABLET | ORAL | 0 refills | Status: DC | PRN

## 2018-01-24 MED ORDER — LIDOCAINE 5 % EX PTCH
1.0000 | MEDICATED_PATCH | CUTANEOUS | Status: DC
  Administered 2018-01-24: 1 via TRANSDERMAL
  Filled 2018-01-24: qty 1

## 2018-01-24 MED ORDER — OXYCODONE HCL 5 MG PO TABS: 10.0000 mg | ORAL_TABLET | ORAL | 0 refills | Status: AC | PRN

## 2018-01-24 MED ORDER — HEPARIN SOD (PORK) LOCK FLUSH 100 UNIT/ML IV SOLN
250.0000 [IU] | INTRAVENOUS | Status: AC | PRN
  Administered 2018-01-24: 250 [IU]

## 2018-01-24 MED ORDER — SODIUM CHLORIDE 0.9% FLUSH: 10.0000 mL | INTRAVENOUS | Status: DC | PRN

## 2018-01-24 MED ORDER — CEFTRIAXONE IV (FOR PTA / DISCHARGE USE ONLY): 2.0000 g | INTRAVENOUS | 0 refills | Status: AC

## 2018-01-24 NOTE — Progress Notes (Signed)
Peripherally Inserted Central Catheter/Midline Placement  The IV Nurse has discussed with the patient and/or persons authorized to consent for the patient, the purpose of this procedure and the potential benefits and risks involved with this procedure.  The benefits include less needle sticks, lab draws from the catheter, and the patient may be discharged home with the catheter. Risks include, but not limited to, infection, bleeding, blood clot (thrombus formation), and puncture of an artery; nerve damage and irregular heartbeat and possibility to perform a PICC exchange if needed/ordered by physician.  Alternatives to this procedure were also discussed.  Bard Power PICC patient education guide, fact sheet on infection prevention and patient information card has been provided to patient /or left at bedside.    PICC/Midline Placement Documentation        Timmothy Sours 01/24/2018, 2:37 PM

## 2018-01-24 NOTE — Progress Notes (Signed)
Occupational Therapy Treatment Patient Details Name: Luis Mccann MRN: [id number] DOB: [birth year] Today's Date: 01/24/2018    History of present illness Pt is a 62 y.o. male admitted 01/19/18  for worsening lower back pain and difficulty walking. MRI showed acute discitis/osteomyelitis and abscess at L3-4 and psoas muscles. CT-guided aspiration performed by IR. Per ortho, may eventually require L3-4 decompression and fusion. PMH includes a-fib, OSA, HTN, DJD, chronic pain.   OT comments  Patient demonstrates bed mobility with modified independence without bed rails, completes LB dressing with min assist after education of AE, and completed simulated tub transfer with min assist for safety with reverse step using RW. Continues to be limited by pain, redirection required throughout session as verbally tangential.  Will have support of spouse at discharge and DC plan remains appropriate.     Follow Up Recommendations  Home health OT;Supervision - Intermittent    Equipment Recommendations  3 in 1 bedside commode    Recommendations for Other Services      Precautions / Restrictions Precautions Precautions: Back;Fall Precaution Comments: Back precautions for comfort; MD to order aspen brace Required Braces or Orthoses: (no brace in room) Restrictions Weight Bearing Restrictions: No       Mobility Bed Mobility Overal bed mobility: Modified Independent Bed Mobility: Rolling;Sidelying to Sit;Sit to Sidelying Rolling: Modified independent (Device/Increase time) Sidelying to sit: Modified independent (Device/Increase time)     Sit to sidelying: Modified independent (Device/Increase time) General bed mobility comments: able to perform without assistance, voiced unsure if able to complete at home as he does not have rails and pt completed without assist  Transfers Overall transfer level: Needs assistance Equipment used: Rolling walker (2 wheeled) Transfers: Sit to/from Stand Sit to  Stand: Min guard         General transfer comment: cueing for hand placement and safety    Balance Overall balance assessment: Needs assistance Sitting-balance support: No upper extremity supported;Feet supported Sitting balance-[name]: Good Sitting balance - Comments: no assist, at times 1 hand support due to pain   Standing balance support: Bilateral upper extremity supported;During functional activity Standing balance-[name]: Fair Standing balance comment: minA to min guard for safety                           ADL either performed or assessed with clinical judgement   ADL Overall ADL's : Needs assistance/impaired             Lower Body Bathing: Minimal assistance Lower Body Bathing Details (indicate cue type and reason): reviewed AE for LB bathing, educated to complete seated for safety      Lower Body Dressing: Minimal assistance;Sit to/from stand Lower Body Dressing Details (indicate cue type and reason): educated on AE for LB dressing, reacher and sock aide, patient reports he may have reacher at home  Toilet Transfer: Min guard;Ambulation;RW Toilet Transfer Details (indicate cue type and reason): simulated in room, cueing for hand placement and safety     Tub/ Shower Transfer: Tub transfer;Minimal assistance;Ambulation;Shower Dealerseat;Rolling walker Tub/Shower Transfer Details (indicate cue type and reason): patient reports having [place] at home, educated and return demonstrated reverse step technique with min assist for safety and cueing to recall sequncing  Functional mobility during ADLs: Minimal assistance General ADL Comments: Pt talking and ambulating then crying out in pain; tangential     Vision       Perception     Praxis  Cognition Arousal/Alertness: Awake/alert Behavior During Therapy: Anxious Overall Cognitive Status: Within Functional Limits for tasks assessed                                 General Comments:  Anxious, tangential speech/questions, easily distracted        Exercises     Shoulder Instructions       General Comments      Pertinent Vitals/ Pain       Pain Assessment: Faces Faces Pain Scale: Hurts whole lot Pain Location:  L hip/anterior thigh with movement Pain Descriptors / Indicators: Burning;Discomfort;Grimacing;Guarding;Moaning;Crying Pain Intervention(s): Limited activity within patient's tolerance;Repositioned  Home Living                                          Prior Functioning/Environment              Frequency  Min 2X/week        Progress Toward Goals  OT Goals(current goals can now be found in the care plan Mccann)  Progress towards OT goals: Progressing toward goals  Acute Rehab OT Goals Patient Stated Goal: less pain, back to normal OT Goal Formulation: With patient Time For Goal Achievement: 02/04/18 Potential to Achieve Goals: Good  Plan Discharge plan remains appropriate;Frequency remains appropriate    Co-evaluation                 AM-PAC PT "6 Clicks" Daily Activity     Outcome Measure   Help from another person eating meals?: None Help from another person taking care of personal grooming?: None Help from another person toileting, which includes using toliet, bedpan, or urinal?: A Little Help from another person bathing (including washing, rinsing, drying)?: A Little Help from another person to put on and taking off regular upper body clothing?: None Help from another person to put on and taking off regular lower body clothing?: A Little 6 Click Score: 21    End of Session Equipment Utilized During Treatment: Gait belt;Rolling walker  OT Visit Diagnosis: Other abnormalities of gait and mobility (R26.89);Muscle weakness (generalized) (M62.81);Pain Pain - Right/Left: Left Pain - part of body: Hip;Leg   Activity Tolerance Patient tolerated treatment well   Patient Left in bed;with call bell/phone  within reach;with bed alarm set   Nurse Communication Mobility status        Time: 1610-9604 OT Time Calculation (min): 20 min  Charges: OT General Charges $OT Visit: 1 Visit OT Treatments $Self Care/Home Management : 8-22 mins  Luis Mccann, OT Acute Rehabilitation Services Pager [phone] Office [phone]    Luis Mccann 01/24/2018, 5:12 PM

## 2018-01-24 NOTE — Consult Note (Signed)
Luis Hazel, MD Chief Complaint: Severe back and left neuropathic thigh pain. History: 62 y.o.malewith a past medical history significant for paroxysmal atrial fibrillation, obstructive sleep apnea, hypertension and degenerative joint disease with chronic pain; who presented to the emergency department secondary to worsening lower back pain and difficulty walking. Patient expressed approximately 2 to 3 weeks of discomfort in his lower back and during this time has been evaluated by Dr. Educational psychologist) with intended relief for his pain after applying epidural shot in his back. Patient reports no improvement whatsoever on his symptoms, which led physician to perform an MRI of his back,demonstrating acute discitis/osteomyelitis and abscess at L3-L4 andpsoas muscles. Patient reported the pain as lightning twisting,not radiated, movement makes the pain worse and rest because just minimal improvement. He reported associated chills but no fever. Pain is 9-10 out of 10 at the worst in intensity.  Patient expressed chills and night sweats intermittently but denies frank fever;patient denies chest pain, shortness of breath, nausea, vomiting, abdominal pain, hematuria, dysuria, melena, hematochezia, urine/feces incontinence or retention.  Past Medical History:  Diagnosis Date  . Arthritis    osteoarthritis  . Dysrhythmia 2000   single episode of afib with RVR with normal echo and rare atrial/ventricular ectopy on f/u Holter ([facility])  . History of kidney stones   . Hypertension   . Obesity (BMI 30.0-34.9) 04/04/2013  . Pneumonia    hx of age 27  . Severe obstructive sleep apnea    do not wear mask/    No Known Allergies  No current facility-administered medications on file prior to encounter.    Current Outpatient Medications on File Prior to Encounter  Medication Sig Dispense Refill  . acetaminophen (TYLENOL) 500 MG tablet Take 500 mg by mouth as needed.    Marland Kitchen amLODipine  (NORVASC) 10 MG tablet TAKE 1 TABLET DAILY 90 tablet 2  . Ascorbic Acid (VITAMIN C) 1000 MG tablet Take 1,000 mg by mouth daily.    . diclofenac (VOLTAREN) 75 MG EC tablet Take 75 mg by mouth 2 (two) times daily.    . Ferrous Sulfate (IRON) 28 MG TABS Take 1 tablet by mouth. Monday Wednesday Friday    . gabapentin (NEURONTIN) 300 MG capsule Take 300 mg by mouth 3 (three) times daily.  1  . methocarbamol (ROBAXIN) 500 MG tablet Take 500 mg by mouth 3 (three) times daily as needed for pain.    . Multiple Vitamins-Minerals (MULTIVITAMIN ADULT PO) Take by mouth.    . olmesartan-hydrochlorothiazide (BENICAR HCT) 40-25 MG tablet Take 1 tablet by mouth daily. 90 tablet 1  . spironolactone (ALDACTONE) 25 MG tablet Take 1 tablet (25 mg total) by mouth daily. NEED OV. 90 tablet 0  . traMADol (ULTRAM) 50 MG tablet Take 100 mg by mouth 3 (three) times daily as needed for pain.    . vitamin A 16109 UNIT capsule Take 10,000 Units by mouth daily.    Marland Kitchen olmesartan-hydrochlorothiazide (BENICAR HCT) 40-25 MG tablet Take 1 tablet by mouth daily. NEED OV. (Patient not taking: Reported on 01/19/2018) 90 tablet 0    Physical Exam: Vitals:   01/24/18 0816 01/24/18 1208  BP: 139/84 119/78  Pulse: (!) 59 66  Resp: 17 18  Temp: 98 F (36.7 C) 98.1 F (36.7 C)  SpO2: 98% 98%   Body mass index is 30.11 kg/m.  Alert and oriented x3.  No shortness of breath or chest pain.  Lung fields are clear to auscultation.  2+ dorsalis pedis and posterior tibialis  pulses bilaterally.  Compartments are soft and nontender.  No significant hip, knee, ankle pain with isolated joint range of motion.  Neuro: 5 out of 5 strength in the right lower extremity.  He does have trace weakness of the hip flexor and quad on the left side (most likely secondary to pain inhibition.  Muscle strength testing here does sit horrific lateral thigh and left posterior lateral back pain).  He has 5 out of 5 EHL, tibialis anterior, gastrocnemius  strength on the left side.  Negative femoral stretch test, negative straight leg raise test.  Patient does have numbness and dysesthesias in the left L3 dermatome.  He is able to stand and ambulate without assistive devices but he has significant back buttock and left thigh pain.  No SI joint pain with direct palpation.  Image: Dg Lumbar Spine 2-3 Views  Result Date: 01/23/2018 CLINICAL DATA:  Spinal stenosis. EXAM: LUMBAR SPINE - 2-3 VIEW COMPARISON:  Status post L3-4 paraspinous aspiration. FINDINGS: Vertebral bodies intact. Severe L2-3 disc height loss. Moderate remaining level vertebral body height loss with endplate spurring. Straightened lumbar lordosis in neutral with minimal grade 1 L3-4 retrolisthesis stable from neutral to flexion and extension positioning. No additional malalignment from neutral to extension positioning. L3-4 dextroscoliosis. IMPRESSION: Grade 1 L3-4 retrolisthesis without dynamic instability. No acute fracture deformity. Electronically Signed   By: Awilda Metro M.D.   On: 01/23/2018 19:23   Ct Aspiration  Result Date: 01/20/2018 INDICATION: Discitis at L3-4 with associated evidence by imaging of left-sided paraspinous infection extending into the paraspinous musculature and left psoas muscle. The patient presents for image guided aspiration for diagnostic purposes. EXAM: CT GUIDED ASPIRATION OF LEFT PARASPINOUS PSOAS MUSCLE COMPARISON:  MRI of the lumbar spine at [facility] on 01/18/2018. ANESTHESIA/SEDATION: Fentanyl 50 mcg IV; Versed 1.0 mg IV Moderate Sedation Time:  10 minutes. The patient was continuously monitored during the procedure by the interventional radiology nurse under my direct supervision. MEDICATIONS: No additional medications. COMPLICATIONS: None immediate. PROCEDURE: Informed written consent was obtained from the patient after a thorough discussion of the procedural risks, benefits and alternatives. All questions were addressed. Maximal Sterile  Barrier Technique was utilized including caps, mask, sterile gowns, sterile gloves, sterile drape, hand hygiene and skin antiseptic. A timeout was performed prior to the initiation of the procedure. CT was performed in a prone position through the lumbar region. A 5 [place] Yueh centesis needle sheath catheter was advanced under CT guidance into the medial aspect of the left psoas muscle adjacent to the lumbar spine at the L3-4 level. Aspiration was performed. The centesis needle was repositioned twice during aspiration. Fluid sample was sent for culture analysis. FINDINGS: CT demonstrates destructive process centered at the L3-4 level affecting primarily the left side of the disc space and extending into the adjacent left psoas muscle and posterior paraspinous musculature. There is edema in the psoas muscle with relative enlargement of the left psoas compared to the right. Aspiration at the level of central edema within the left medial psoas muscle yielded a total of 4 mL of bloody fluid. This was sent for culture analysis. No discrete drainable abscess was identified to warrant any drain placement at this time. IMPRESSION: CT-guided paraspinous aspiration at the L3-4 level within an edematous portion of the medial left ileo-psoas muscle abutting the L3-4 disc space. This yielded approximately 4 mL of bloody fluid which was sent for culture analysis. Electronically Signed   By: Irish Lack M.D.   On: 01/20/2018  13:33   [place] Ekg Site Rite  Result Date: 01/22/2018 If Site Rite image not attached, placement could not be confirmed due to current cardiac rhythm.  Dg Outside Films Spine  Result Date: 01/19/2018 This examination belongs to an outside facility and is stored here for comparison purposes only.  Contact the originating outside institution for any associated report or interpretation.   A/P: Luis Mccann is a very pleasant 62 year old gentleman who is under the care of my partner Dr. Shelle Iron.  Patient's  imaging studies demonstrated significant foraminal stenosis on the left side at L3-4 with L3 neural compression.  The patient ultimately had injection therapy but then soon after developed debilitating severe pain.  Repeat imaging studies demonstrated a psoas abscess as well as septic facet arthritis.  There was no epidural hematoma or abscess noted.  The patient was ultimately admitted for IV antibiotics.  He did undergo a CT-guided aspiration of the psoas abscess which was abutting the L3-4 disc space.  The patient states that since being hospitalized these received better pain medication which seemed to help but whenever he ambulates or sits he develops that severe left lateral thigh pain which she describes as a stabbing sensation.  He notes his overall pain level is debilitating.  I was asked to evaluate the patient by my partner for further treatment and evaluation.  Patient has severe disc loss at the L2-3 level with endplate spurring.  He has a grade 1 anterolisthesis at L3-4.  There is no dynamic instability or acute fracture noted.  Approximately 13 degrees of local kyphosis L2-4.  AP x-rays demonstrate the scoliosis with advanced degenerative disease L2-3 and L3-4.  There is approximately 12 degree Cobb angle L2-4.  The MRI of his lumbar spine dated 01/18/2018: Moderate to severe foraminal stenosis left side L3-4.  Moderate to severe left lateral recess stenosis L2-3.  Moderate right lateral recess stenosis L4-5.  There is a psoas abscess measuring approximately 3-1/2 cm in its largest diameter.  There is also material in the left L3/4 facet which does extend into the lateral recess.  It appears more to be consistent with phlegmon than an epidural abscess.  There is extension into the paraspinal muscles of the abscess.  I had a long discussion with the patient and I do believe the best course of action at least initially is conservative care.  This would be a TLSO brace to help prevent developing any  instability or worsening of his kyphosis/scoliosis.  And ongoing IV antibiotics.  In the event that he develops neurological deficits or progressive neurological deficits, intractable debilitating pain despite the brace treatment, or progressive deformity despite the brace treatment then we may need to consider moving forward with surgical intervention.  This would most likely entail an L2-4 instrumented fusion.  I would also move forward with the indirect decompression especially on the left side to address the severe L3 nerve compression and neuropathic pain.  I will continue to monitor his progress.  If there is any questions or concerns we are happy to address them.

## 2018-01-24 NOTE — Discharge Summary (Signed)
Physician Discharge Summary  YECHEZKEL FERTIG NWG:[id number] DOB: 28-Aug-1955 DOA: 01/19/2018  PCP: Kathyrn Lass, MD  Admit date: 01/19/2018 Discharge date: 01/24/2018  Time spent: 60 minutes  Recommendations for Outpatient Follow-up:  1. Follow up outpatient CBC/CMP 2. Ensure completion of antibiotic regimen as outpatient and follow up with infectious disease in 4 weeks  3. Follow up with Dr. Rolena Infante as outpatient 4. Pt with hx of afib and chadsvasc of 1, would discuss plan for stroke prevention with PCP (asa vs anticoagulation?) 5. Remove PICC at completion of abx  Discharge Diagnoses:  Principal Problem:   Discitis of lumbar region Active Problems:   Paroxysmal atrial fibrillation (HCC)   HTN (hypertension)   OSA (obstructive sleep apnea)   Discitis   Discharge Condition: stable  Diet recommendation: heart healthy  Filed Weights   01/19/18 1244  Weight: 89.8 kg    History of present illness:  Luis Mccann is Luis Mccann 62 y.o.malewith Aiyah Scarpelli past medical history significant for paroxysmal atrial fibrillation, obstructive sleep apnea, hypertension and degenerative joint disease with chronic pain low back pain who presented to the emergency department for worsening lower back pain, left hip pain and difficulty walking. He expressed about 3 weeks of worsening low back pain for which PM&R had been following, given epidural injection, also evaluated by Dr. Estanislado Pandy) who performed left hip trochanteric injection. Symptoms persisted so MRI of the lumbar spine and left hip were performed and demonstrated L3-L4 discitis, osteomyelitis extending to the left paraspinal musculature/psoas. Dr. Vertell Limber was consulted by EDP and reportedly felt no surgical intervention was indicated. He was directed to the ED, reported some chills but no fevers. He was admitted for IV pain medications and antibiotics, IR consulted and performed CT-guided aspiration for culture which has grown Haemophilus parainfluenzae.  Ceftriaxone has been continued, and ID is consulted for further recommendations.  ID recommended 42 days of ceftriaxone with follow up in 4 weeks.  Dr. Rolena Infante was consulted and recommended TLSO brace and outpatient follow up.  Hospital Course:  Discitis/osteomyelitis with paraspinal abscess at L3-L4: s/p aspiration with culture growing beta-lactamase-negative H. parainfluenzae.  - Given vancomycin/zosyn x1, then CTX. Continue ceftriaxone x42 days (PICC to be inserted today) per ID. CM consulted. Leukocytosis has resolved, pt has remained afebrile.  - Discussed need for ID follow up in 4 weeks - Monitor blood cultures (NG5D) -Pain improved today, has used oxycodone 10 mg and no IV pain meds (PMP aware reviewed, discharged with #40 5 mg oxycodone) - Continue adjunctive Tx: tylenol, diclofenac, neurontin, antispasmodic for chronic low back pain.  Left hip pain, suspected due to L3 foraminal stenosis:  - Per orthopedics: Dr. Rolena Infante recommending TLSO brace at this time with IV abx.  Planning for outpatient follow up  PAF: Currently NSR. CHA2DS2-VASc score of 1, not on home stroke prevention.  - recommend discussion with PCP regarding ASA vs anticoagulation  Resistant HTN: Controlled.  - Continue norvasc, ARB, HCTZ, and spironolactone.   OSA:  - CPAP qHS  Procedures:  CT-guided aspiration 9/21 by Dr. Kathlene Cote  Consultations:  Orthopedics  ID  IR  Discharge Exam: Vitals:   01/24/18 0816 01/24/18 1208  BP: 139/84 119/78  Pulse: (!) 59 66  Resp: 17 18  Temp: 98 F (36.7 C) 98.1 F (36.7 C)  SpO2: 98% 98%   Anxious Still with some pain, but oxycodone helps Worried about being home in pain.  General: No acute distress. Cardiovascular: Heart sounds show Megumi Treaster regular rate, and rhythm.  Lungs: Clear to  auscultation bilaterally  Abdomen: Soft, nontender, nondistended Neurological: Alert and oriented 3. Moves all extremities 4. Cranial nerves II through XII grossly  intact. Skin: Warm and dry. No rashes or lesions. No midline TTP of spine L hip with some mild TTP Extremities: No clubbing or cyanosis. No edema.  Psychiatric: Mood and affect are normal. Insight and judgment are appropriate.  Discharge Instructions   Discharge Instructions    Call MD for:  difficulty breathing, headache or visual disturbances   Complete by:  As directed    Call MD for:  extreme fatigue   Complete by:  As directed    Call MD for:  persistant dizziness or light-headedness   Complete by:  As directed    Call MD for:  persistant nausea and vomiting   Complete by:  As directed    Call MD for:  redness, tenderness, or signs of infection (pain, swelling, redness, odor or green/yellow discharge around incision site)   Complete by:  As directed    Call MD for:  severe uncontrolled pain   Complete by:  As directed    Call MD for:  temperature >100.4   Complete by:  As directed    Diet - low sodium heart healthy   Complete by:  As directed    Discharge instructions   Complete by:  As directed    You were seen for discitis/osteomyelitis.  You had fluid drained by interventional radiology.  You will be discharged on ceftriaxone to get at home.  You will be discharged with oxycodone for pain.  Try to use this only as needed.  You will need to get additional refills from your PCP or orthopedic doctor.   You should follow up with orthopedics and infectious disease as an outpatient.   You have atrial fibrillation, please follow this up with your PCP to discuss Truxton Stupka long term plan for stroke prevention.  Return for new, recurrent, or worsening symptoms.  Please ask your PCP to request records from this hospitalization so they know what was done and what the next steps will be.   Home infusion instructions [facility] May follow [place] Dosing Protocol; May administer Cathflo as needed to maintain patency of vascular access device.; Flushing of vascular access  device: per [facility] Protocol: 0.9% NaCl pre/post medica...   Complete by:  As directed    Instructions:  May follow [place] Dosing Protocol   Instructions:  May administer Cathflo as needed to maintain patency of vascular access device.   Instructions:  Flushing of vascular access device: per [facility] Protocol: 0.9% NaCl pre/post medication administration and prn patency; Heparin 100 u/ml, 1m for implanted ports and Heparin 10u/ml, 547mfor all other central venous catheters.   Instructions:  May follow [facility] Anaphylaxis Protocol for First Dose Administration in the home: 0.9% NaCl at 25-50 ml/hr to maintain IV access for protocol meds. Epinephrine 0.3 ml IV/IM PRN and Benadryl 25-50 IV/IM PRN s/s of anaphylaxis.   Instructions:  [facility] Coordinator (RN) to assist per patient IV care needs in the home PRN.   Increase activity slowly   Complete by:  As directed      Allergies as of 01/24/2018   No Known Allergies     Medication List    STOP taking these medications   traMADol 50 MG tablet Commonly known as:  ULTRAM     TAKE these medications   acetaminophen 500 MG tablet Commonly known as:  TYLENOL Take 500  mg by mouth as needed.   amLODipine 10 MG tablet Commonly known as:  NORVASC TAKE 1 TABLET DAILY   cefTRIAXone  IVPB Commonly known as:  ROCEPHIN Inject 2 g into the vein daily. Indication:  Osteomyelitis/Discitis Last Day of Therapy:  03/02/18 Labs - Once weekly:  CBC/D and BMP, Labs - Every other week:  ESR and CRP   diclofenac 75 MG EC tablet Commonly known as:  VOLTAREN Take 75 mg by mouth 2 (two) times daily.   gabapentin 300 MG capsule Commonly known as:  NEURONTIN Take 300 mg by mouth 3 (three) times daily.   Iron 28 MG Tabs Take 1 tablet by mouth. Monday Wednesday Friday   methocarbamol 500 MG tablet Commonly known as:  ROBAXIN Take 500 mg by mouth 3 (three) times daily as needed for pain.   MULTIVITAMIN ADULT PO Take by mouth.    olmesartan-hydrochlorothiazide 40-25 MG tablet Commonly known as:  BENICAR HCT Take 1 tablet by mouth daily. What changed:  Another medication with the same name was removed. Continue taking this medication, and follow the directions you see here.   oxyCODONE 5 MG immediate release tablet Commonly known as:  Oxy IR/ROXICODONE Take 2 tablets (10 mg total) by mouth every 4 (four) hours as needed for up to 5 days for severe pain.   spironolactone 25 MG tablet Commonly known as:  ALDACTONE Take 1 tablet (25 mg total) by mouth daily. NEED OV.   vitamin Marilynn Ekstein 10000 UNIT capsule Take 10,000 Units by mouth daily.   vitamin C 1000 MG tablet Take 1,000 mg by mouth daily.            Home Infusion Instuctions  (From admission, onward)         Start     Ordered   01/24/18 0000  Home infusion instructions [facility] May follow [place] Dosing Protocol; May administer Cathflo as needed to maintain patency of vascular access device.; Flushing of vascular access device: per [facility] Protocol: 0.9% NaCl pre/post medica...    Question Answer Comment  Instructions May follow [place] Dosing Protocol   Instructions May administer Cathflo as needed to maintain patency of vascular access device.   Instructions Flushing of vascular access device: per [facility] Protocol: 0.9% NaCl pre/post medication administration and prn patency; Heparin 100 u/ml, 43m for implanted ports and Heparin 10u/ml, 597mfor all other central venous catheters.   Instructions May follow [facility] Anaphylaxis Protocol for First Dose Administration in the home: 0.9% NaCl at 25-50 ml/hr to maintain IV access for protocol meds. Epinephrine 0.3 ml IV/IM PRN and Benadryl 25-50 IV/IM PRN s/s of anaphylaxis.   Instructions [facility] Infusion Coordinator (RN) to assist per patient IV care needs in the home PRN.      01/24/18 1557           Durable Medical Equipment  (From admission, onward)         Start     Ordered    01/24/18 1545  DME 3-in-1  Once     01/24/18 1557         No Known Allergies Follow-up Information    MiKathyrn LassMD.   Specialty[facility]  [facility] Contact information: [phone]        HaCampbell RichesMD Follow up.   Specialty:  Infectious Diseases Why:  Please follow up in 4 weeks with infectious disease Contact information: [address]  [place] [address] [phone]            The results of significant diagnostics from this hospitalization (including imaging, microbiology, ancillary and laboratory) are listed below for reference.    Significant Diagnostic Studies: Dg Lumbar Spine 2-3 Views  Result Date: 01/23/2018 CLINICAL DATA:  Spinal stenosis. EXAM: LUMBAR SPINE - 2-3 VIEW COMPARISON:  Status post L3-4 paraspinous aspiration. FINDINGS: Vertebral bodies intact. Severe L2-3 disc height loss. Moderate remaining level vertebral body height loss with endplate spurring. Straightened lumbar lordosis in neutral with minimal grade 1 L3-4 retrolisthesis stable from neutral to flexion and extension positioning. No additional malalignment from neutral to extension positioning. L3-4 dextroscoliosis. IMPRESSION: Grade 1 L3-4 retrolisthesis without dynamic instability. No acute fracture deformity. Electronically Signed   By: Elon Alas M.D.   On: 01/23/2018 19:23   Ct Aspiration  Result Date: 01/20/2018 INDICATION: Discitis at L3-4 with associated evidence by imaging of left-sided paraspinous infection extending into the paraspinous musculature and left psoas muscle. The patient presents for image guided aspiration for diagnostic purposes. EXAM: CT GUIDED ASPIRATION OF LEFT PARASPINOUS PSOAS MUSCLE COMPARISON:  MRI of the lumbar spine at [facility] on 01/18/2018. ANESTHESIA/SEDATION: Fentanyl 50 mcg IV; Versed 1.0 mg IV Moderate Sedation Time:  10 minutes. The patient was continuously monitored during  the procedure by the interventional radiology nurse under my direct supervision. MEDICATIONS: No additional medications. COMPLICATIONS: None immediate. PROCEDURE: Informed written consent was obtained from the patient after Courtenay Hirth thorough discussion of the procedural risks, benefits and alternatives. All questions were addressed. Maximal Sterile Barrier Technique was utilized including caps, mask, sterile gowns, sterile gloves, sterile drape, hand hygiene and skin antiseptic. Trindon Dorton timeout was performed prior to the initiation of the procedure. CT was performed in Amellia Panik prone position through the lumbar region. Evana Runnels 5 [place] Yueh centesis needle sheath catheter was advanced under CT guidance into the medial aspect of the left psoas muscle adjacent to the lumbar spine at the L3-4 level. Aspiration was performed. The centesis needle was repositioned twice during aspiration. Fluid sample was sent for culture analysis. FINDINGS: CT demonstrates destructive process centered at the L3-4 level affecting primarily the left side of the disc space and extending into the adjacent left psoas muscle and posterior paraspinous musculature. There is edema in the psoas muscle with relative enlargement of the left psoas compared to the right. Aspiration at the level of central edema within the left medial psoas muscle yielded Chynah Orihuela total of 4 mL of bloody fluid. This was sent for culture analysis. No discrete drainable abscess was identified to warrant any drain placement at this time. IMPRESSION: CT-guided paraspinous aspiration at the L3-4 level within an edematous portion of the medial left ileo-psoas muscle abutting the L3-4 disc space. This yielded approximately 4 mL of bloody fluid which was sent for culture analysis. Electronically Signed   By: Aletta Edouard M.D.   On: 01/20/2018 13:33   [place] Ekg Site Rite  Result Date: 01/22/2018 If Site Rite image not attached, placement could not be confirmed due to current cardiac rhythm.  Dg Outside  Films Spine  Result Date: 01/19/2018 This examination belongs to an outside facility and is stored here for comparison purposes only.  Contact the originating outside institution for any associated report or interpretation.   Microbiology: Recent Results (from the past 240 hour(s))  Blood culture (routine x 2)     Status: None   Collection Time: 01/19/18  2:58 PM  Result Value Ref Range Status  Specimen Description BLOOD LEFT ANTECUBITAL  Final   Special Requests   Final    BOTTLES DRAWN AEROBIC AND ANAEROBIC Blood Culture adequate volume   Culture   Final    NO GROWTH 5 DAYS Performed at [facility], [address], [place], [place] [address]    Report Status 01/24/2018 FINAL  Final  Culture, blood (Routine X 2) w Reflex to ID Panel     Status: None   Collection Time: 01/19/18  3:03 PM  Result Value Ref Range Status   Specimen Description BLOOD RIGHT HAND  Final   Special Requests   Final    BOTTLES DRAWN AEROBIC AND ANAEROBIC Blood Culture results may not be optimal due to an excessive volume of blood received in culture bottles   Culture   Final    NO GROWTH 5 DAYS Performed at [facility], [place] [address] [place], [place] [address]    Report Status 01/24/2018 FINAL  Final  Aerobic/Anaerobic Culture (surgical/deep wound)     Status: None (Preliminary result)   Collection Time: 01/20/18 12:43 PM  Result Value Ref Range Status   Specimen Description ABSCESS  Final   Special Requests Normal  Final   Gram Stain   Final    FEW WBC PRESENT,BOTH PMN AND MONONUCLEAR NO ORGANISMS SEEN Performed at [facility], [address], [place], [place] [address]    Culture   Final    RARE HAEMOPHILUS PARAINFLUENZAE BETA LACTAMASE NEGATIVE NO ANAEROBES ISOLATED; CULTURE IN PROGRESS FOR 5 DAYS    Report Status PENDING  Incomplete     Labs: Basic Metabolic Panel: Recent Labs  Lab 01/19/18 1357 01/20/18 0623  NA 135 136  K 3.8 4.2  CL 98 103  CO2 27 21*   GLUCOSE 98 95  BUN 26* 21  CREATININE 1.17 0.95  CALCIUM 10.2 9.6   Liver Function Tests: No results for input(s): AST, ALT, ALKPHOS, BILITOT, PROT, ALBUMIN in the last 168 hours. No results for input(s): LIPASE, AMYLASE in the last 168 hours. No results for input(s): AMMONIA in the last 168 hours. CBC: Recent Labs  Lab 01/19/18 1357 01/20/18 0623  WBC 10.5 8.5  HGB 14.4 13.1  HCT 44.1 40.0  MCV 86.8 86.2  PLT 349 307   Cardiac Enzymes: No results for input(s): CKTOTAL, CKMB, CKMBINDEX, TROPONINI in the last 168 hours. BNP: BNP (last 3 results) No results for input(s): BNP in the last 8760 hours.  ProBNP (last 3 results) No results for input(s): PROBNP in the last 8760 hours.  CBG: No results for input(s): GLUCAP in the last 168 hours.     Signed:  Fayrene Helper MD.  [facility] 01/24/2018, 3:58 PM

## 2018-01-24 NOTE — Care Management Note (Signed)
Case Management Note  Patient Details  Name: Caryn SectionDavid O Nielsen MRN: [id number] Date of Birth: Jan 22, 1956  Subjective/Objective:                    Action/Plan: [facility]. [facility] aware of d/c. HH RN to see patient tomorrow for first dose of IV abx.  Wife to provide transportation home.   Expected Discharge Date:                  Expected Discharge Plan:  Home w Home Health Services  In-House Referral:     Discharge planning Services  CM Consult  Post Acute Care Choice:  Home Health Choice offered to:  Patient  DME Arranged:    DME Agency:     HH Arranged:  RN, PT, OT HH Agency:  [facility]  Status of Service:  Completed, signed off  If discussed at Long Length of Stay Meetings, dates discussed:    Additional Comments:  Kermit BaloKelli F Torianne Laflam, RN 01/24/2018, 4:26 PM

## 2018-01-24 NOTE — Progress Notes (Signed)
Patient discharged in stable condition with all belongings and wife at bedside. They both verbalized understanding of all discharge instructions and importance of follow up visits.  

## 2018-01-26 LAB — AEROBIC/ANAEROBIC CULTURE W GRAM STAIN (SURGICAL/DEEP WOUND)

## 2018-01-26 LAB — AEROBIC/ANAEROBIC CULTURE (SURGICAL/DEEP WOUND): SPECIAL REQUESTS: NORMAL

## 2018-01-31 ENCOUNTER — Telehealth: Payer: Self-pay

## 2018-01-31 NOTE — Telephone Encounter (Signed)
That would be great.  Thanks 

## 2018-01-31 NOTE — Telephone Encounter (Signed)
Patient called today stating he was supposed to have a 4 week hospital follow-up scheduled with Dr.HAtcher. Patient has not received a call back from office with an appointment after leaving a message on Monday. Did not see any open appointments with DR. Hatcher in 4 weeks. Scheduled an appointment with Rexene Alberts, NP on 02/21/18.   Patient states he was in the Hospital for Discitis in L3 and L4 region. Started antibiotics on 9/26. Does not have any complaints regarding antibiotics. Did mention he is experiencing pain in his back. Is unable to move/ walk like he would like to. States he feels like he will have issues coming to an appointment with our office since he is experiencing pain when he moves. Patient would like a call on his mobil phone number if any changes are made. Will inform Dr. Ninetta Lights of patients call.  Luis Mccann, Luis Mccann

## 2018-02-21 ENCOUNTER — Encounter: Payer: Self-pay | Admitting: Infectious Diseases

## 2018-02-21 ENCOUNTER — Ambulatory Visit: Payer: BC Managed Care – PPO | Admitting: Infectious Diseases

## 2018-02-21 VITALS — BP 132/83 | HR 60 | Temp 98.3°F | Wt 190.1 lb

## 2018-02-21 DIAGNOSIS — Z5181 Encounter for therapeutic drug level monitoring: Secondary | ICD-10-CM

## 2018-02-21 DIAGNOSIS — M4646 Discitis, unspecified, lumbar region: Secondary | ICD-10-CM | POA: Diagnosis not present

## 2018-02-21 DIAGNOSIS — Z452 Encounter for adjustment and management of vascular access device: Secondary | ICD-10-CM | POA: Diagnosis not present

## 2018-02-21 NOTE — Patient Instructions (Signed)
Nice to see you touching your toes (although slow) this is good progress and I am very hopeful you will continue to improve.   Please call to let me know if you have any worsened pain throughout the remainder of your treatment before we see you back again.   I plan on having you continue antibiotics for 14 days after you are done with your IV antibiotics on November 2.   Please come back for a follow up in early December to see how you are doing after stopping antibiotics.

## 2018-02-21 NOTE — Progress Notes (Signed)
Patient: Luis Mccann  DOB: 01-24-1956 MRN: [id number] PCP: Kathyrn Lass, MD  Referring Provider: [facility]   Patient Active Problem List   Diagnosis Date Noted  . PICC (peripherally inserted central catheter) in place 02/21/2018  . Medication monitoring encounter 02/21/2018  . Discitis of lumbar region 01/19/2018  . Discitis 01/19/2018  . Arthralgia 02/27/2017  . Prosthetic wear following total hip arthroplasty ([place]) 09/24/2013  . Paroxysmal atrial fibrillation ([place]) 04/04/2013  . HTN (hypertension) 04/04/2013  . OSA (obstructive sleep apnea) 04/04/2013  . Overweight 04/04/2013  . Nephrolithiasis 04/04/2013     Subjective:  Luis Mccann is a 62 y.o. man here for hospital follow up on his H.parainfluenzae L3/4 discitis. He was discharged from the hospital on 01/24/18.   He is here today with his wife. They are both wondering where this infection came from and concerned if this was something that happened at the gym. He tells me that before his back pain started in August of this year he had in late May the "top 5 most severe sinus infections and sore throat ever."   May he had the "top 5 most severe sinus and sore throat infections ever." He does not think his infection was from back or hip injections because the pain pre-dated these administrations. He has a hip prosthesis and fortunately has had no trouble with this during the course of present illness. He is doing well with his ceftriaxone injections and tolerating w/o concern for s/e. No trouble with PICC line. He has finally started showing some improvement with regards to his pain and now able to (slowly) touch his toes. No longer needing to sleep in a recliner also. Denies fevers/chills/sweats.   Review of Systems  Constitutional: Negative for chills, fever, malaise/fatigue and weight loss.  HENT: Negative for sore throat.        No dental problems  Respiratory: Negative for cough and sputum production.   Cardiovascular:  Negative for chest pain and leg swelling.  Gastrointestinal: Negative for abdominal pain, diarrhea and vomiting.  Genitourinary: Negative for dysuria and flank pain.  Musculoskeletal: Positive for back pain. Negative for joint pain, myalgias and neck pain.  Skin: Negative for rash.  Neurological: Negative for dizziness, tingling and headaches.  Psychiatric/Behavioral: Negative for depression and substance abuse. The patient is not nervous/anxious and does not have insomnia.     Past Medical History:  Diagnosis Date  . Arthritis    osteoarthritis  . Dysrhythmia 2000   single episode of afib with RVR with normal echo and rare atrial/ventricular ectopy on f/u Holter ([facility])  . History of kidney stones   . Hypertension   . Obesity (BMI 30.0-34.9) 04/04/2013  . Pneumonia    hx of age 18  . Severe obstructive sleep apnea    do not wear mask/    Outpatient Medications Prior to Visit  Medication Sig Dispense Refill  . amLODipine (NORVASC) 10 MG tablet TAKE 1 TABLET DAILY 90 tablet 2  . Ascorbic Acid (VITAMIN C) 1000 MG tablet Take 1,000 mg by mouth daily.    . cefTRIAXone (ROCEPHIN) IVPB Inject 2 g into the vein daily. Indication:  Osteomyelitis/Discitis Last Day of Therapy:  03/02/18 Labs - Once weekly:  CBC/D and BMP, Labs - Every other week:  ESR and CRP 38 Units 0  . diclofenac (VOLTAREN) 75 MG EC tablet Take 75 mg by mouth 2 (two) times daily.    . Ferrous Sulfate (IRON) 28 MG TABS Take 1 tablet  by mouth. Monday Wednesday Friday    . Multiple Vitamins-Minerals (MULTIVITAMIN ADULT PO) Take by mouth.    . olmesartan-hydrochlorothiazide (BENICAR HCT) 40-25 MG tablet Take 1 tablet by mouth daily. 90 tablet 1  . spironolactone (ALDACTONE) 25 MG tablet Take 1 tablet (25 mg total) by mouth daily. NEED OV. 90 tablet 0  . vitamin A 10000 UNIT capsule Take 10,000 Units by mouth daily.    . methocarbamol (ROBAXIN) 500 MG tablet Take 500 mg by mouth 3 (three) times daily as needed for pain.    Marland Kitchen  acetaminophen (TYLENOL) 500 MG tablet Take 500 mg by mouth as needed.    . gabapentin (NEURONTIN) 300 MG capsule Take 300 mg by mouth 3 (three) times daily.  1   No facility-administered medications prior to visit.      No Known Allergies  Social History   Tobacco Use  . Smoking status: Former Smoker    Types: Cigarettes  . Smokeless tobacco: Never Used  . Tobacco comment: quit 30 years ago-social smoker  Substance Use Topics  . Alcohol use: Yes    Comment: occasional  . Drug use: No    Objective:   Vitals:   02/21/18 1345  BP: 132/83  Pulse: 60  Temp: 98.3 F (36.8 C)  Weight: 190 lb 1.9 oz (86.2 kg)   Body mass index is 28.91 kg/m.  Physical Exam  Constitutional: He is oriented to person, place, and time. He appears well-developed and well-nourished.  HENT:  Mouth/Throat: Oropharynx is clear and moist. No oral lesions. Normal dentition. No dental caries.  Eyes: Pupils are equal, round, and reactive to light. Conjunctivae are normal. No scleral icterus.  Cardiovascular: Normal rate, regular rhythm and normal heart sounds.  Pulmonary/Chest: Effort normal and breath sounds normal.  Abdominal: Soft. He exhibits no distension. There is no tenderness.  Musculoskeletal:  Slowly sits to stand with upper extremity effort. He can achieve about 90% of ROM with regards to forward bending. Requires use of cane for stability. No deformity on exam.   Lymphadenopathy:    He has no cervical adenopathy.  Neurological: He is alert and oriented to person, place, and time.  Skin: Skin is warm and dry. No rash noted.  Psychiatric: He has a normal mood and affect. Thought content normal.    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 We discussed again how this is not a common bacteria to occupy a vertebral disc space in an otherwise immunocompetent person, but not unheard of. I have mostly read case reports about H. Parainfluenza bone/joint infections that follow back injection (which timing wise makes more sense) however he is certain the severe pain abruptly started prior to the injection in his  back. Prior to he has not had any nasal/ENT surgery or procedure but did have what he refers to as a severe sinus infection in April or May. He is starting to show clinical improvement now on day 29 of therapy with IV Ceftriaxone. Will continue as planned through Nov 1st to complete 42 days of treatment then transition to oral therapy for 2-3 weeks with doxycycline 100 mg BID; he will follow up again after stopping antibiotics x 3-4 weeks.       Medication monitoring encounter    All OPAT lab work reviewed and within normal limits. Inflammatory markers responding well and he has had clinical improvement. Tolerating medications well.        PICC (peripherally inserted central catheter) in place    Continue care and maintenance through 11/01 then can D/C          Janene Madeira, MSN, NP-C [facility] for [place] Pager: [phone] Office: [phone]  02/27/18  9:15 PM

## 2018-02-21 NOTE — Assessment & Plan Note (Signed)
All OPAT lab work reviewed and within normal limits. Inflammatory markers responding well and he has had clinical improvement. Tolerating medications well.

## 2018-02-21 NOTE — Assessment & Plan Note (Addendum)
Continue care and maintenance through 11/01 then can D/C

## 2018-02-23 NOTE — Assessment & Plan Note (Addendum)
Hemophilus parainfluenza L3-4 discitis (beta lactamase negative). We discussed again how this is not a common bacteria to occupy a vertebral disc space in an otherwise immunocompetent person, but not unheard of. I have mostly read case reports about H. Parainfluenza bone/joint infections that follow back injection (which timing wise makes more sense) however he is certain the severe pain abruptly started prior to the injection in his back. Prior to he has not had any nasal/ENT surgery or procedure but did have what he refers to as a severe sinus infection in April or May. He is starting to show clinical improvement now on day 29 of therapy with IV Ceftriaxone. Will continue as planned through Nov 1st to complete 42 days of treatment then transition to oral therapy for 2-3 weeks with doxycycline 100 mg BID; he will follow up again after stopping antibiotics x 3-4 weeks.

## 2018-02-27 ENCOUNTER — Other Ambulatory Visit: Payer: Self-pay | Admitting: Behavioral Health

## 2018-02-27 ENCOUNTER — Telehealth: Payer: Self-pay | Admitting: Behavioral Health

## 2018-02-27 DIAGNOSIS — M4646 Discitis, unspecified, lumbar region: Secondary | ICD-10-CM

## 2018-02-27 MED ORDER — DOXYCYCLINE HYCLATE 100 MG PO TABS: 100.0000 mg | ORAL_TABLET | Freq: Two times a day (BID) | ORAL | 0 refills | Status: AC

## 2018-02-27 NOTE — Telephone Encounter (Signed)
Called Luis Mccann, Informed him per Rexene Alberts NP that his IV antibiotics are good through November 1st and he can have his PICC line pulled out after last dose.   Also informed home that there are no contraindications for a cortisone injection.  Also informed him  oral doxycycline 100 mg (1 pill) twice daily with food until all gone.  He is to follow up after completion as scheduled on 04/11/2018.  [name] verbalized understanding and had no additional questions.   Called [facility] to verify Pull PICC date and they have last dose of March 02, 2018. Angeline Slim RN

## 2018-02-27 NOTE — Telephone Encounter (Signed)
Antibiotics through November 1st is good - he can then have his PICC Line pulled on the 2nd.  I have sent in a rx for doxycycline that he can take one pill twice a day with food until gone. At that point he will stop antibiotics and see me following completion as scheduled on 12/11.   No contraindications for cortisone injection.   Thank you!

## 2018-02-27 NOTE — Progress Notes (Signed)
Doxycycline orders placed.

## 2018-02-27 NOTE — Telephone Encounter (Signed)
Patient called stating he had three questions for [facility]. 1. Patient has a cortisone injection scheduled for tomorrow for plantar fascitis and wanted to make sure there were on contraindications.  2. Patient wants to verify the last dose date for IV antibiotics.  Per Note it states March 03, 2018 however patient states he only has enough to get him through March 02, 2018  3: Patient states he is supposed to start an oral antibiotic and wants it called in so he can have it when his PICC line is removed.  [facility] [address].    Angeline Slim RN

## 2018-02-28 ENCOUNTER — Encounter

## 2018-02-28 ENCOUNTER — Ambulatory Visit (INDEPENDENT_AMBULATORY_CARE_PROVIDER_SITE_OTHER): Payer: BC Managed Care – PPO

## 2018-02-28 ENCOUNTER — Other Ambulatory Visit: Payer: Self-pay | Admitting: Podiatry

## 2018-02-28 ENCOUNTER — Encounter: Payer: Self-pay | Admitting: Podiatry

## 2018-02-28 ENCOUNTER — Ambulatory Visit: Payer: BC Managed Care – PPO | Admitting: Podiatry

## 2018-02-28 DIAGNOSIS — M722 Plantar fascial fibromatosis: Secondary | ICD-10-CM

## 2018-02-28 DIAGNOSIS — M779 Enthesopathy, unspecified: Principal | ICD-10-CM

## 2018-02-28 DIAGNOSIS — M7671 Peroneal tendinitis, right leg: Secondary | ICD-10-CM

## 2018-02-28 DIAGNOSIS — M778 Other enthesopathies, not elsewhere classified: Secondary | ICD-10-CM

## 2018-02-28 MED ORDER — TRIAMCINOLONE ACETONIDE 10 MG/ML IJ SUSP
10.0000 mg | Freq: Once | INTRAMUSCULAR | Status: AC
  Administered 2018-02-28: 10 mg

## 2018-03-01 NOTE — Progress Notes (Signed)
Subjective:   Patient ID: Luis Mccann, male   DOB: 61 y.o.   MRN: [id number]   HPI Patient presents stating his had a lot of pain in the bottom of his right heel when he started walking again.  He was off his foot for approximately 9 weeks while recovering from an infection in his back and started gait and started to develop a lot of pain in the bottom of the heel   ROS      Objective:  Physical Exam  Neurovascular status intact with exquisite discomfort plantar medial and central aspect right heel at the insertional point tendon into the calcaneus with moderate depression of the arch     Assessment:  Acute plantar fasciitis right with inflammation fluid with moderate gait change and depressed arch     Plan:  H&P condition reviewed and went ahead and injected the plantar fascia 3 mg Kenalog 5 mg Xylocaine applied fascial brace to lift up the arch.  Gave instructions on physical therapy and supportive shoes and reappoint for [place] to recheck  X-ray indicates that there is minimal spur formation and normal arch height.  No indications of stress fracture

## 2018-03-14 ENCOUNTER — Encounter: Payer: Self-pay | Admitting: Podiatry

## 2018-03-14 ENCOUNTER — Ambulatory Visit: Payer: BC Managed Care – PPO | Admitting: Podiatry

## 2018-03-14 DIAGNOSIS — M722 Plantar fascial fibromatosis: Secondary | ICD-10-CM | POA: Diagnosis not present

## 2018-03-14 DIAGNOSIS — M2040 Other hammer toe(s) (acquired), unspecified foot: Secondary | ICD-10-CM | POA: Diagnosis not present

## 2018-03-14 MED ORDER — TRIAMCINOLONE ACETONIDE 10 MG/ML IJ SUSP
10.0000 mg | Freq: Once | INTRAMUSCULAR | Status: AC
  Administered 2018-03-14: 10 mg

## 2018-03-14 NOTE — Progress Notes (Signed)
Subjective:   Patient ID: Luis Mccann, male   DOB: [age] y.o.   MRN: [id number]   HPI Patient states that he is still having some pain in the heel and he is worried if he starts to get more active but will get worse and also concerned about hammertoe surgery he had done a number of years ago   ROS      Objective:  Physical Exam  Neurovascular status intact with discomfort of a mild nature plantar aspect right heel with quite a bit of improvement from previous with patient noted to have no indication of tendon dysfunction third fourth fifth digits with mild hammertoe deformity     Assessment:  Probability that were dealing with a mild plantar fasciitis right along with continued digital deformities of the lesser toes     Plan:  H&P and discussed both conditions in great length.  As far as the plantar fasciitis I did do a careful injection 3 mg Kenalog 5 mg Xylocaine and I went ahead today and for the digits I recommended no treatment as they are not painful and I do not see any indications for the weakness he seems to perceive.  I am not able to make a determination of this

## 2018-04-03 ENCOUNTER — Telehealth: Payer: Self-pay | Admitting: *Deleted

## 2018-04-03 DIAGNOSIS — M4646 Discitis, unspecified, lumbar region: Secondary | ICD-10-CM

## 2018-04-03 NOTE — Telephone Encounter (Signed)
Patient called to advise that he has been having pain for about 2 weeks. The pain reminds him of the pain prior to his infection last time. He reports no fever, it is not constant and he is wondering if the pain could just be him disc and not an infection. He has a follow up visit 04/11/18 and wants to know if he should come sooner. Advised will send the provider a note and give him a call back once she responds.

## 2018-04-03 NOTE — Telephone Encounter (Signed)
If he feels it is similar to previous infection I would like to repeat his MRI so we can determine further steps we need. I am very confident we got him well treated and this is likely something unrelated to infection but imaging will hopefully provide with some useful information.    I have put the orders in and hopefully we can get this arranged prior to his visit with me so we can get more information. At this point I would rest and continue off antibiotics and follow for any worsening or severe symptoms.   Thank you, Feliz Beamravis.

## 2018-04-03 NOTE — Addendum Note (Signed)
Addended by: Blanchard KelchIXON, Tarrie Mcmichen N on: 04/03/2018 04:26 PM   Modules accepted: Orders

## 2018-04-04 ENCOUNTER — Other Ambulatory Visit: Payer: Self-pay | Admitting: Cardiovascular Disease

## 2018-04-04 ENCOUNTER — Telehealth: Payer: Self-pay | Admitting: Cardiovascular Disease

## 2018-04-04 NOTE — Telephone Encounter (Signed)
°*  STAT* If patient is at the pharmacy, call can be transferred to refill team.   1. Which medications need to be refilled? (please list name of each medication and dose if known)  New prescription for Amlodipine, Olmesartan ,Medoxomil/HC, Spironolactone  2. Which pharmacy/location (including street and city if local pharmacy) is medication to be sent to? [facility]  3. Do they need a 30 day or 90 day supply? He needs a 90 day supply for each medicine

## 2018-04-04 NOTE — Telephone Encounter (Signed)
LMTCB - patient is overdue for 1 year visit with MD

## 2018-04-04 NOTE — Telephone Encounter (Signed)
ew Message:     Pt wants to know if Dr C thinks he needs the Coronary Calcium Scoring Test?

## 2018-04-04 NOTE — Telephone Encounter (Signed)
LMTCB - patient is overdue for 1 year visit with MD 

## 2018-04-05 NOTE — Telephone Encounter (Signed)
Message left for patient to call the office for response from [place].

## 2018-04-06 MED ORDER — AMLODIPINE BESYLATE 10 MG PO TABS: 10.0000 mg | ORAL_TABLET | Freq: Every day | ORAL | 0 refills | Status: DC

## 2018-04-06 MED ORDER — SPIRONOLACTONE 25 MG PO TABS: 25.0000 mg | ORAL_TABLET | Freq: Every day | ORAL | 0 refills | Status: DC

## 2018-04-06 MED ORDER — OLMESARTAN MEDOXOMIL-HCTZ 40-25 MG PO TABS: 1.0000 | ORAL_TABLET | Freq: Every day | ORAL | 0 refills | Status: DC

## 2018-04-06 NOTE — Telephone Encounter (Signed)
Please make sure he has a recent (last 6-12 months) lipid profile before coming [facility]

## 2018-04-06 NOTE — Telephone Encounter (Signed)
Spoke with patient. He has been scheduled for 1 year visit. Meds refilled.

## 2018-04-06 NOTE — Telephone Encounter (Signed)
Spoke with patient who states PCP recommended that he have a screening calcium score test done. Advised patient that he may need to discuss with MD in office first - he was due for 1 year visit in October. An appointment has been made on 05/01/18 @ 920am.   Will route to MD

## 2018-04-08 ENCOUNTER — Ambulatory Visit (HOSPITAL_COMMUNITY)
Admission: RE | Admit: 2018-04-08 | Discharge: 2018-04-08 | Disposition: A | Discharge status: Home / Self Care | Payer: BC Managed Care – PPO | Source: Ambulatory Visit | Attending: Infectious Diseases | Admitting: Infectious Diseases

## 2018-04-08 DIAGNOSIS — M48061 Spinal stenosis, lumbar region without neurogenic claudication: Secondary | ICD-10-CM | POA: Insufficient documentation

## 2018-04-08 DIAGNOSIS — M4856XA Collapsed vertebra, not elsewhere classified, lumbar region, initial encounter for fracture: Secondary | ICD-10-CM | POA: Diagnosis not present

## 2018-04-08 DIAGNOSIS — M4646 Discitis, unspecified, lumbar region: Secondary | ICD-10-CM

## 2018-04-08 DIAGNOSIS — M47816 Spondylosis without myelopathy or radiculopathy, lumbar region: Secondary | ICD-10-CM | POA: Insufficient documentation

## 2018-04-08 MED ORDER — GADOBUTROL 1 MMOL/ML IV SOLN
9.0000 mL | Freq: Once | INTRAVENOUS | Status: AC | PRN
  Administered 2018-04-08: 9 mL via INTRAVENOUS

## 2018-04-10 NOTE — Telephone Encounter (Signed)
[facility]

## 2018-04-11 ENCOUNTER — Ambulatory Visit: Payer: BC Managed Care – PPO | Admitting: Infectious Diseases

## 2018-04-11 ENCOUNTER — Ambulatory Visit: Payer: BC Managed Care – PPO | Admitting: Podiatry

## 2018-04-11 ENCOUNTER — Encounter: Payer: Self-pay | Admitting: Infectious Diseases

## 2018-04-11 ENCOUNTER — Ambulatory Visit (INDEPENDENT_AMBULATORY_CARE_PROVIDER_SITE_OTHER): Payer: BC Managed Care – PPO | Admitting: Infectious Diseases

## 2018-04-11 VITALS — Ht 69.0 in | Wt 190.0 lb

## 2018-04-11 DIAGNOSIS — M4646 Discitis, unspecified, lumbar region: Secondary | ICD-10-CM

## 2018-04-11 NOTE — Progress Notes (Signed)
Patient: Luis Mccann  DOB: 1956/03/12 MRN: [id number] PCP: Kathyrn Lass, MD  Referring Provider: [facility]   Patient Active Problem List   Diagnosis Date Noted  . Medication monitoring encounter 02/21/2018  . Discitis of lumbar region 01/19/2018  . Discitis 01/19/2018  . Trochanteric bursitis of left hip 01/12/2018  . Pain in right knee 09/20/2017  . Scoliosis deformity of spine 08/18/2017  . Spinal stenosis of lumbar region 08/18/2017  . Hammer toe of right foot 07/18/2017  . Arthralgia 02/27/2017  . Prosthetic wear following total hip arthroplasty ([place]) 09/24/2013  . Paroxysmal atrial fibrillation ([place]) 04/04/2013  . HTN (hypertension) 04/04/2013  . OSA (obstructive sleep apnea) 04/04/2013  . Overweight 04/04/2013  . Nephrolithiasis 04/04/2013     Subjective:  CC:  Re-evaluation off antibiotics. Still some lower leg/buttock pain. Concerned about relapsing infection.   Brief ID Hx:  Luis Mccann is a 62 y.o. male with h/o H.parainfluenzae L3/4 discitis that was discovered during recent hospitalization in September 2019. He was treated for 6 weeks with IV ceftriaxone followed by 3 weeks of doxycycline PO. His PICC line has been removed.   HPI:  He feels that since stopping the IV antibiotics his left lower back/hip pain has worsened and was worried that the infection was coming back. He has not had any fevers/chills or night sweats. He has been working with physical therapy and feels that over the last few days this has actually been helping the most. He reports the pain to be when he adjusts from sitting to standing L lower back/hip/buttock region. He is also uncomfortable most at night trying to sleep.   Review of Systems  Constitutional: Negative for chills, fever, malaise/fatigue and weight loss.  HENT: Negative for sore throat.   Respiratory: Negative for cough and sputum production.   Cardiovascular: Negative for chest pain and leg swelling.  Gastrointestinal: Negative  for abdominal pain, diarrhea and vomiting.  Genitourinary: Negative for dysuria and flank pain.  Musculoskeletal: Positive for back pain. Negative for joint pain, myalgias and neck pain.  Skin: Negative for rash.  Neurological: Negative for dizziness, tingling and headaches.  Psychiatric/Behavioral: Negative for depression and substance abuse. The patient is not nervous/anxious and does not have insomnia.     Past Medical History:  Diagnosis Date  . Arthritis    osteoarthritis  . Dysrhythmia 2000   single episode of afib with RVR with normal echo and rare atrial/ventricular ectopy on f/u Holter ([facility])  . History of kidney stones   . Hypertension   . Obesity (BMI 30.0-34.9) 04/04/2013  . Pneumonia    hx of age 44  . Severe obstructive sleep apnea    do not wear mask/    Outpatient Medications Prior to Visit  Medication Sig Dispense Refill  . acetaminophen (TYLENOL) 500 MG tablet Take 500 mg by mouth as needed.    Marland Kitchen amLODipine (NORVASC) 10 MG tablet Take 1 tablet (10 mg total) by mouth daily. 90 tablet 0  . Ascorbic Acid (VITAMIN C) 1000 MG tablet Take 1,000 mg by mouth daily.    . diclofenac (VOLTAREN) 75 MG EC tablet Take 75 mg by mouth 2 (two) times daily.    . Ferrous Sulfate (IRON) 28 MG TABS Take 1 tablet by mouth. Monday Wednesday Friday    . gabapentin (NEURONTIN) 300 MG capsule gabapentin 300 mg capsule  TAKE 1 CAP DAILY X 3 DAYS, THEN 2 CAPS DAILY X 3 DAYS, THEN 3 CAPS DAILY AS TOLERATED    .  ibuprofen (ADVIL,MOTRIN) 800 MG tablet Take by mouth.    Marland Kitchen LORazepam (ATIVAN) 1 MG tablet TAKE 1-2 TAB 90 MIN PRIOR TO MRI FOR ANXIETY. DO NOT DRIVE.  0  . Multiple Vitamins-Minerals (MULTIVITAMIN ADULT PO) Take by mouth.    . olmesartan-hydrochlorothiazide (BENICAR HCT) 40-25 MG tablet Take 1 tablet by mouth daily. 90 tablet 0  . oxyCODONE (OXY IR/ROXICODONE) 5 MG immediate release tablet oxycodone 5 mg tablet  TAKE 2 TABLETS BY MOUTH EVERY 4 HOURS AS NEEDED FOR UP TO 5 DAYS FOR  SEVERE PAIN.    Marland Kitchen Oxycodone HCl 10 MG TABS oxycodone 10 mg tablet  TAKE 1 TABLET BY MOUTH EVERY 6 HOURS    . spironolactone (ALDACTONE) 25 MG tablet Take 1 tablet (25 mg total) by mouth daily. 90 tablet 0  . vitamin A 10000 UNIT capsule Take 10,000 Units by mouth daily.     No facility-administered medications prior to visit.      No Known Allergies  Social History   Tobacco Use  . Smoking status: Former Smoker    Types: Cigarettes  . Smokeless tobacco: Never Used  . Tobacco comment: quit 30 years ago-social smoker  Substance Use Topics  . Alcohol use: Yes    Comment: occasional  . Drug use: No    Objective:   Vitals:   04/11/18 1643  Weight: 190 lb (86.2 kg)  Height: _0  (1.753 m)   Body mass index is 28.06 kg/m.  Physical Exam Constitutional:      Appearance: He is well-developed and well-nourished.  HENT:     Mouth/Throat:     Mouth: Oropharynx is clear and moist. No oral lesions.     Dentition: Normal dentition. No dental caries.  Eyes:     General: No scleral icterus.    Conjunctiva/sclera: Conjunctivae normal.     Pupils: Pupils are equal, round, and reactive to light.  Cardiovascular:     Rate and Rhythm: Normal rate and regular rhythm.     Heart sounds: Normal heart sounds.  Pulmonary:     Effort: Pulmonary effort is normal.     Breath sounds: Normal breath sounds.  Abdominal:     General: There is no distension.     Palpations: Abdomen is soft.     Tenderness: There is no abdominal tenderness.  Musculoskeletal:     Lumbar back: He exhibits tenderness. He exhibits normal range of motion, no bony tenderness, no swelling and no edema.       Back:  Lymphadenopathy:     Cervical: No cervical adenopathy.  Skin:    General: Skin is warm and dry.     Findings: No rash.  Neurological:     Mental Status: He is alert and oriented to person, place, and time.  Psychiatric:        Mood and Affect: Mood and affect normal.        Thought Content: Thought  content normal.     Lab Results: Lab Results  Component Value Date   WBC 4.7 04/11/2018   HGB 13.3 04/11/2018   HCT 38.7 04/11/2018   MCV 86.4 04/11/2018   PLT 246 04/11/2018    Lab Results  Component Value Date   CREATININE 0.95 01/20/2018   BUN 21 01/20/2018   NA 136 01/20/2018   K 4.2 01/20/2018   CL 103 01/20/2018   CO2 21 (L) 01/20/2018    Lab Results  Component Value Date   ALT 20 09/17/2013   AST 20  09/17/2013   ALKPHOS 46 09/17/2013   BILITOT 0.5 09/17/2013     Assessment & Plan:   Problem List Items Addressed This Visit      Unprioritized   Discitis of lumbar region - Primary    Repeated MRI reviewed personally - no concern for re-accumulation of abscesses and decrease in inflammation. There is still not surprisingly a read indicating "ongoing osteomyelitis/discitis" but at this point in his treatment and recovery I would expect the radiographic resolution to be slow. He has no tenderness over L3/4 vertebral space. His sitting to standing has improved significantly as has his forward bend and in general ROM of lower back. Will check CRP/ESR/CBC for reassurance today to validate that the findings on MRI are likely sterile.   His exam is more c/w SI joint dysfunction to me. Will see what lab work look like - if needed can restart doxy vs starting levaquin for a period of a few more weeks. He understands and has no further questions.       Relevant Orders   C-reactive protein (Completed)   Sedimentation rate (Completed)   CBC (Completed)     I spent 25 minutes with the patient including greater than 75% of time in face to face counsel of the patient regarding the above, diagnostic studies done so far, image review and in coordination of car.   Follow up pending the results of blood work. Encouraged him to continue working with orthopedic team and PT.   Janene Madeira, MSN, NP-C [facility] [place] Pager:  [phone] Office: [phone]  04/13/18  2:52 PM

## 2018-04-12 LAB — CBC
HCT: 38.7 % (ref 38.5–50.0)
Hemoglobin: 13.3 g/dL (ref 13.2–17.1)
MCH: 29.7 pg (ref 27.0–33.0)
MCHC: 34.4 g/dL (ref 32.0–36.0)
MCV: 86.4 fL (ref 80.0–100.0)
MPV: 10.4 fL (ref 7.5–12.5)
Platelets: 246 10*3/uL (ref 140–400)
RBC: 4.48 10*6/uL (ref 4.20–5.80)
RDW: 13.8 % (ref 11.0–15.0)
WBC: 4.7 10*3/uL (ref 3.8–10.8)

## 2018-04-12 LAB — C-REACTIVE PROTEIN: CRP: 1.6 mg/L (ref <8.0)

## 2018-04-12 LAB — SEDIMENTATION RATE: Sed Rate: 11 mm/h (ref 0–20)

## 2018-04-12 NOTE — Progress Notes (Signed)
MRI with resolution of previous abscesses, improved inflammation and continued normalization of inflammatory markers following treatment. Would interpret this as a successful treatment of his infection and do not recommend further antibiotics. I suspect that his pain is due to SI joint dysfunction based on exam. Follow up with ortho team and as needed here.

## 2018-04-13 ENCOUNTER — Encounter: Payer: Self-pay | Admitting: Infectious Diseases

## 2018-04-13 NOTE — Assessment & Plan Note (Addendum)
Repeated MRI reviewed personally - no concern for re-accumulation of abscesses and decrease in inflammation. There is still not surprisingly a read indicating "ongoing osteomyelitis/discitis" but at this point in his treatment and recovery I would expect the radiographic resolution to be slow. He has no tenderness over L3/4 vertebral space. His sitting to standing has improved significantly as has his forward bend and in general ROM of lower back. Will check CRP/ESR/CBC for reassurance today to validate that the findings on MRI are likely sterile.   His exam is more c/w SI joint dysfunction to me. Will see what lab work look like - if needed can restart doxy vs starting levaquin for a period of a few more weeks. He understands and has no further questions.  

## 2018-05-01 ENCOUNTER — Encounter: Payer: Self-pay | Admitting: Cardiovascular Disease

## 2018-05-01 ENCOUNTER — Ambulatory Visit (INDEPENDENT_AMBULATORY_CARE_PROVIDER_SITE_OTHER): Payer: BC Managed Care – PPO | Admitting: Cardiovascular Disease

## 2018-05-01 VITALS — BP 120/76 | HR 59 | Ht 69.0 in | Wt 195.6 lb

## 2018-05-01 DIAGNOSIS — R011 Cardiac murmur, unspecified: Secondary | ICD-10-CM

## 2018-05-01 DIAGNOSIS — I480 Paroxysmal atrial fibrillation: Secondary | ICD-10-CM | POA: Diagnosis not present

## 2018-05-01 DIAGNOSIS — G4733 Obstructive sleep apnea (adult) (pediatric): Secondary | ICD-10-CM

## 2018-05-01 DIAGNOSIS — E663 Overweight: Secondary | ICD-10-CM

## 2018-05-01 DIAGNOSIS — I10 Essential (primary) hypertension: Secondary | ICD-10-CM | POA: Diagnosis not present

## 2018-05-01 DIAGNOSIS — M4646 Discitis, unspecified, lumbar region: Secondary | ICD-10-CM

## 2018-05-01 NOTE — Progress Notes (Signed)
Cardiology Office Note    Date:  05/02/2018   ID:  Luis Mccann, DOB [birth year], MRN [id number]  PCP:  Kathyrn Lass, MD  Cardiologist:   Sanda Klein, MD   No chief complaint on file.   History of Present Illness:  Luis Mccann is a 62 y.o. male with remote history of a single episode of atrial fibrillation, OSA on CPAP, essential hypertension, borderline obesity, but without known structural heart disease returns for follow-up.  He was hospitalized with L3-L4 discitis a couple of months ago.  Sensation with this with severe back pain radiating to his anterior left thigh, leading to almost complete immobility.  Direct sampling of an abscess showed evidence of Haemophilus parainfluenzae.  He did not have an echocardiogram.  A blood culture was negative.  He completed 6 weeks of intravenous ceftriaxone followed by 3 weeks of oral doxycycline.  ESR and CRP were markedly elevated at diagnosis, when rechecked in December they were back to normal range.  He has a lot of joint problems.  He complains of pain in his right shoulder where he has had a previous subacromial resection rotator cuff repair, his right hip which has been replaced twice, lumbar spine where he has a known problem with L4, both knees.  Haemophilus L3-L4 discitis in October-November 2019.  The patient specifically denies any chest pain at rest exertion, dyspnea at rest or with exertion, orthopnea, paroxysmal nocturnal dyspnea, syncope, palpitations, focal neurological deficits, intermittent claudication, lower extremity edema, unexplained weight gain, cough, hemoptysis or wheezing.  Compliant with CPAP. He denies excessive daytime sleepiness.  He has noticed excellent benefit from treatment with CPAP.  He has gained back a little weight.  Past Medical History:  Diagnosis Date  . Arthritis    osteoarthritis  . Dysrhythmia 2000   single episode of afib with RVR with normal echo and rare atrial/ventricular ectopy on f/u  Holter ([facility])  . History of kidney stones   . Hypertension   . Obesity (BMI 30.0-34.9) 04/04/2013  . Pneumonia    hx of age 57  . Severe obstructive sleep apnea    do not wear mask/    Past Surgical History:  Procedure Laterality Date  . ACHILLES TENDON REPAIR Left 1987 and 1990  . COLONOSCOPY    . DOPPLER ECHOCARDIOGRAPHY  06/12/1998   normal doppler examination, normal overall LV systolic function  . HIP SURGERY Right    arthoscopic x1, one other surgery   . INCISION AND DRAINAGE HIP Right 09/24/2013   Procedure: RIGHT HIP BEARING SURFACE REVISION;  Surgeon: Gearlean Alf, MD;  Location: [facility];  Service: Orthopedics;  Laterality: Right;  . JOINT REPLACEMENT  1995   right hip  . KIDNEY STONE SURGERY    . LITHOTRIPSY     x4  . NASAL SEPTOPLASTY W/ TURBINOPLASTY Bilateral 01/02/2013   Procedure: NASAL SEPTOPLASTY WITH TURBINATE REDUCTION;  Surgeon: Jodi Marble, MD;  Location: [facility];  Service: ENT;  Laterality: Bilateral;  . NASAL SEPTUM SURGERY  1980's  . RENAL ARTERY DUPLEX  09/22/2004   abdominal aorta-normal taper with no suggestion of aneurysmal dilatation/diameter reduction. normal patency of right and left kidney arteries. right and left kidneys equal in size, symmetrical in shape with normal cortex and medulla with normal resistance indices and normal echogencity with no evidence of hydronephrosis.  Marland Kitchen SHOULDER SURGERY Left 1990's   for burr  . SHOULDER SURGERY Right 2010, 2012   abuttment and rotator cuff repair  . SLEEP STUDY  10/25/2011   AHI during total sleep time - 88.39/hr, AHI during REM 75.00/hr    Current Medications: Outpatient Medications Prior to Visit  Medication Sig Dispense Refill  . amLODipine (NORVASC) 10 MG tablet Take 1 tablet (10 mg total) by mouth daily. 90 tablet 0  . Ascorbic Acid (VITAMIN C) 1000 MG tablet Take 1,000 mg by mouth daily.    . Ferrous Sulfate (IRON) 28 MG TABS Take 1 tablet by mouth. Monday Wednesday Friday    . Multiple  Vitamins-Minerals (MULTIVITAMIN ADULT PO) Take by mouth.    . olmesartan-hydrochlorothiazide (BENICAR HCT) 40-25 MG tablet Take 1 tablet by mouth daily. 90 tablet 0  . spironolactone (ALDACTONE) 25 MG tablet Take 1 tablet (25 mg total) by mouth daily. 90 tablet 0  . traMADol (ULTRAM) 50 MG tablet Take 50 mg by mouth every 6 (six) hours as needed.    Marland Kitchen acetaminophen (TYLENOL) 500 MG tablet Take 500 mg by mouth as needed.    . diclofenac (VOLTAREN) 75 MG EC tablet Take 75 mg by mouth 2 (two) times daily.    Marland Kitchen gabapentin (NEURONTIN) 300 MG capsule gabapentin 300 mg capsule  TAKE 1 CAP DAILY X 3 DAYS, THEN 2 CAPS DAILY X 3 DAYS, THEN 3 CAPS DAILY AS TOLERATED    . ibuprofen (ADVIL,MOTRIN) 800 MG tablet Take by mouth.    Marland Kitchen LORazepam (ATIVAN) 1 MG tablet TAKE 1-2 TAB 90 MIN PRIOR TO MRI FOR ANXIETY. DO NOT DRIVE.  0  . oxyCODONE (OXY IR/ROXICODONE) 5 MG immediate release tablet oxycodone 5 mg tablet  TAKE 2 TABLETS BY MOUTH EVERY 4 HOURS AS NEEDED FOR UP TO 5 DAYS FOR SEVERE PAIN.    Marland Kitchen Oxycodone HCl 10 MG TABS oxycodone 10 mg tablet  TAKE 1 TABLET BY MOUTH EVERY 6 HOURS    . vitamin A 10000 UNIT capsule Take 10,000 Units by mouth daily.     No facility-administered medications prior to visit.      Allergies:   Patient has no known allergies.   Social History   Socioeconomic History  . Marital status: Married    Spouse name: Not on file  . Number of children: Not on file  . Years of education: Not on file  . Highest education level: Not on file  Occupational History  . Not on file  Social Needs  . Financial resource strain: Not on file  . Food insecurity:    Worry: Not on file    Inability: Not on file  . Transportation needs:    Medical: Not on file    Non-medical: Not on file  Tobacco Use  . Smoking status: Former Smoker    Types: Cigarettes  . Smokeless tobacco: Never Used  . Tobacco comment: quit 30 years ago-social smoker  Substance and Sexual Activity  . Alcohol use: Yes      Comment: occasional  . Drug use: No  . Sexual activity: Not on file  Lifestyle  . Physical activity:    Days per week: Not on file    Minutes per session: Not on file  . Stress: Not on file  Relationships  . Social connections:    Talks on phone: Not on file    Gets together: Not on file    Attends religious service: Not on file    Active member of club or organization: Not on file    Attends meetings of clubs or organizations: Not on file    Relationship status: Not on file  Other Topics Concern  . Not on file  Social History Narrative  . Not on file     Family History:  The patient's family history includes Cancer (age of onset: 30) in his sister.   ROS:   Please see the history of present illness.    ROS all other systems are reviewed and are negative   PHYSICAL EXAM:   VS:  BP 120/76   Pulse (!) 59   Ht '5\' 9"'  (1.753 m)   Wt 195 lb 9.6 oz (88.7 kg)   BMI 28.89 kg/m      General: Alert, oriented x3, no distress, overweight Head: no evidence of trauma, PERRL, EOMI, no exophtalmos or lid lag, no myxedema, no xanthelasma; normal ears, nose and oropharynx Neck: normal jugular venous pulsations and no hepatojugular reflux; brisk carotid pulses without delay and no carotid bruits Chest: clear to auscultation, no signs of consolidation by percussion or palpation, normal fremitus, symmetrical and full respiratory excursions Cardiovascular: normal position and quality of the apical impulse, regular rhythm, normal first and second heart sounds, no diastolic murmurs, rubs or gallops.  8-8/1 systolic ejection murmur heard best at both the right and left upper sternal borders Abdomen: no tenderness or distention, no masses by palpation, no abnormal pulsatility or arterial bruits, normal bowel sounds, no hepatosplenomegaly Extremities: no clubbing, cyanosis or edema; 2+ radial, ulnar and brachial pulses bilaterally; 2+ right femoral, posterior tibial and dorsalis pedis pulses; 2+  left femoral, posterior tibial and dorsalis pedis pulses; no subclavian or femoral bruits Neurological: grossly nonfocal Psych: Normal mood and affect    Wt Readings from Last 3 Encounters:  05/01/18 195 lb 9.6 oz (88.7 kg)  04/11/18 190 lb (86.2 kg)  02/21/18 190 lb 1.9 oz (86.2 kg)      Studies/Labs Reviewed:   EKG:  EKG is ordered today.  The ekg ordered today demonstrates sinus bradycardia, left axis deviation, incomplete left bundle branch block QRS 116 ms, QTC 423 ms.  No ischemic changes are seen Recent Labs: 01/20/2018: BUN 21; Creatinine, Ser 0.95; Potassium 4.2; Sodium 136 04/11/2018: Hemoglobin 13.3; Platelets 246    ASSESSMENT:    1. Paroxysmal atrial fibrillation (HCC)   2. Essential hypertension   3. Obstructive sleep apnea   4. Overweight   5. Septic discitis of lumbar region   6. Cardiac murmur      PLAN:  In order of problems listed above:  1. AFib: Has not been documented except for the initial episode that occurred 15 years ago.  Embolic risk is low. CHADSVasc 1 (HTN).  Not on anticoagulants. 2. HTN: Very well controlled but requires multiple agents. 3. OSA: Compliant with CPAP.  He notes excellent symptomatic improvement while using this. 4. Overweight: He is gained back about 5 pounds, possibly due to immobility during his back problems 5. Discitis: He completed a total of 6 weeks of intravenous antibiotics, so even if he had endocarditis he should be well treated.  Inflammatory markers have returned to baseline suggesting infection has been cured.  Nevertheless, his murmur sounds more intense than I recall it and I think it is worth repeating the echo (last evaluated in 2015).    Medication Adjustments/Labs and Tests Ordered: Current medicines are reviewed at length with the patient today.  Concerns regarding medicines are outlined above.  Medication changes, Labs and Tests ordered today are listed in the Patient Instructions below. Patient  Instructions  Medication Instructions:  Dr Sallyanne Kuster recommends that you continue on your current medications  as directed. Please refer to the Current Medication list given to you today.  If you need a refill on your cardiac medications before your next appointment, please call your pharmacy.   Testing/Procedures: Your physician has requested that you have an echocardiogram. Echocardiography is a painless test that uses sound waves to create images of your heart. It provides your doctor with information about the size and shape of your heart and how well your heart's chambers and valves are working. This procedure takes approximately one hour. There are no restrictions for this procedure.  >> This will be performed at our [facility] location [place], [place] [address] [phone]  Follow-Up: At [facility], you and your health needs are our priority.  As part of our continuing mission to provide you with exceptional heart care, we have created designated Provider Care Teams.  These Care Teams include your primary Cardiologist (physician) and Advanced Practice Providers (APPs -  Physician Assistants and Nurse Practitioners) who all work together to provide you with the care you need, when you need it. You will need a follow up appointment in 12 months.  Please call our office 2 months in advance to schedule this appointment.  You may see Sanda Klein, MD or one of the following Advanced Practice Providers on your designated Care Team: [place], [place] . Fabian Sharp, PA-C . You will receive a reminder letter in the mail two months in advance. If you don't receive a letter, please call our office to schedule the follow-up appointment.    Signed, Sanda Klein, MD  05/02/2018 11:07 AM    [facility] [address], [place], [place]  [address] Phone: [phone]; Fax: [phone]

## 2018-05-01 NOTE — Patient Instructions (Signed)
Medication Instructions:  Dr Royann Shiversroitoru recommends that you continue on your current medications as directed. Please refer to the Current Medication list given to you today.  If you need a refill on your cardiac medications before your next appointment, please call your pharmacy.   Testing/Procedures: Your physician has requested that you have an echocardiogram. Echocardiography is a painless test that uses sound waves to create images of your heart. It provides your doctor with information about the size and shape of your heart and how well your heart's chambers and valves are working. This procedure takes approximately one hour. There are no restrictions for this procedure.  >> This will be performed at our [facility] location [address], [address] [place] [address] [phone]  Follow-Up: At [facility], you and your health needs are our priority.  As part of our continuing mission to provide you with exceptional heart care, we have created designated Provider Care Teams.  These Care Teams include your primary Cardiologist (physician) and Advanced Practice Providers (APPs -  Physician Assistants and Nurse Practitioners) who all work together to provide you with the care you need, when you need it. You will need a follow up appointment in 12 months.  Please call our office 2 months in advance to schedule this appointment.  You may see Thurmon FairMihai Croitoru, MD or one of the following Advanced Practice Providers on your designated Care Team: [place] Meng, [place]-C . Micah FlesherAngela Duke, PA-C . You will receive a reminder letter in the mail two months in advance. If you don't receive a letter, please call our office to schedule the follow-up appointment.

## 2018-05-08 NOTE — Addendum Note (Signed)
Addended by: Chana BodeGREEN, Geral Coker L on: 05/08/2018 09:31 AM   Modules accepted: Orders

## 2018-05-09 ENCOUNTER — Ambulatory Visit (HOSPITAL_COMMUNITY): Payer: BC Managed Care – PPO | Attending: Cardiology

## 2018-05-09 ENCOUNTER — Other Ambulatory Visit: Payer: Self-pay

## 2018-05-09 DIAGNOSIS — R011 Cardiac murmur, unspecified: Secondary | ICD-10-CM | POA: Insufficient documentation

## 2018-06-24 ENCOUNTER — Other Ambulatory Visit: Payer: Self-pay | Admitting: Cardiovascular Disease

## 2018-09-20 ENCOUNTER — Other Ambulatory Visit: Payer: Self-pay | Admitting: *Deleted

## 2018-09-20 MED ORDER — AMLODIPINE BESYLATE 10 MG PO TABS: 10.0000 mg | ORAL_TABLET | Freq: Every day | ORAL | 2 refills | Status: DC

## 2018-11-21 ENCOUNTER — Telehealth: Payer: Self-pay | Admitting: Cardiovascular Disease

## 2018-11-21 NOTE — Telephone Encounter (Signed)
Bree,Representative from [facility] calling stating that pt's medication Olmesartan-HCTZ is on backorder and they would like to know if Dr. Sallyanne Kuster would like to send in a alternative medication for the pt. Please return call at [phone], Ref# [id number] or send it new medication. Please address

## 2018-11-22 MED ORDER — HYDROCHLOROTHIAZIDE 25 MG PO TABS: 25.0000 mg | ORAL_TABLET | Freq: Every day | ORAL | 1 refills | Status: DC

## 2018-11-22 MED ORDER — OLMESARTAN MEDOXOMIL 40 MG PO TABS: 40.0000 mg | ORAL_TABLET | Freq: Every day | ORAL | 1 refills | Status: DC

## 2018-11-22 NOTE — Telephone Encounter (Signed)
Rx sent to olmesartan 40mg  + hctz 25mg  Patient notified of change via MyChart

## 2018-11-22 NOTE — Telephone Encounter (Signed)
Yes , split away

## 2019-02-26 ENCOUNTER — Telehealth: Payer: Self-pay | Admitting: Cardiovascular Disease

## 2019-02-26 NOTE — Telephone Encounter (Signed)
Will route to covering nurse for MD for sleep equipment to see if CMN was received and placed in Dr.Kelly's box.

## 2019-02-26 NOTE — Telephone Encounter (Signed)
Follow Up:     Luis Mccann is calling to check on the status of the CMN form that she faxed over on 02-20-19. Please fax this back to her asap to [phone].

## 2019-02-26 NOTE — Telephone Encounter (Signed)
Do you have this paperwork to sign

## 2019-03-04 ENCOUNTER — Telehealth: Payer: Self-pay | Admitting: Cardiovascular Disease

## 2019-03-04 NOTE — Telephone Encounter (Signed)
Patient walked in and states [facility] needs CPAP supply prescription approved. They have requested prescription over 2 weeks ago.   Routed to [place] CMA

## 2019-03-04 NOTE — Telephone Encounter (Signed)
Pt calling requesting a refill on his C-Pap supplies, be sent to [facility]. Pt would like a call back concerning this matter at [phone]. Please address

## 2019-06-26 ENCOUNTER — Other Ambulatory Visit: Payer: Self-pay

## 2019-06-27 ENCOUNTER — Other Ambulatory Visit: Payer: Self-pay | Admitting: *Deleted

## 2019-06-27 MED ORDER — SPIRONOLACTONE 25 MG PO TABS: 25.0000 mg | ORAL_TABLET | Freq: Every day | ORAL | 0 refills | Status: DC

## 2019-06-27 MED ORDER — OLMESARTAN MEDOXOMIL 40 MG PO TABS: 40.0000 mg | ORAL_TABLET | Freq: Every day | ORAL | 0 refills | Status: DC

## 2019-06-27 MED ORDER — AMLODIPINE BESYLATE 10 MG PO TABS: 10.0000 mg | ORAL_TABLET | Freq: Every day | ORAL | 0 refills | Status: DC

## 2019-06-27 NOTE — Telephone Encounter (Signed)
Rx has been sent to the pharmacy electronically. ° °

## 2019-07-02 ENCOUNTER — Telehealth: Payer: Self-pay | Admitting: Cardiovascular Disease

## 2019-07-02 NOTE — Telephone Encounter (Signed)
Pt c/o medication issue:  1. Name of Medication: olmesartan (BENICAR) 40 MG tablet  2. How are you currently taking this medication (dosage and times per day)? N/A  3. Are you having a reaction (difficulty breathing--STAT)? No  4. What is your medication issue? Patient states that [facility] filled his prescription wrong. He states that he is supposed to have the combination pill 40/25mg . I see on the notes to the pharmacy that it says this prescription is to replace the combination pill but he states that he does not want to replace the combination pill and that he has not seen Dr. Royann Shivers.  Please advise. He states that [facility] was supposed to call the office.

## 2019-07-02 NOTE — Telephone Encounter (Signed)
Left message to call back  

## 2019-07-04 MED ORDER — OLMESARTAN MEDOXOMIL-HCTZ 40-25 MG PO TABS: 1.0000 | ORAL_TABLET | Freq: Every day | ORAL | 3 refills | Status: DC

## 2019-07-04 NOTE — Telephone Encounter (Signed)
Left message for pt to call.

## 2019-07-04 NOTE — Telephone Encounter (Signed)
Spoke with pt, his pharmacy has the combination pill now. New script sent to the pharmacy

## 2019-07-25 ENCOUNTER — Ambulatory Visit: Payer: BC Managed Care – PPO | Admitting: Cardiovascular Disease

## 2019-09-12 ENCOUNTER — Ambulatory Visit: Payer: Self-pay | Attending: Internal Medicine

## 2019-09-12 DIAGNOSIS — Z23 Encounter for immunization: Secondary | ICD-10-CM

## 2019-09-12 NOTE — Progress Notes (Signed)
   Covid-19 Vaccination Clinic  Name:  Luis Mccann    MRN: [id number] DOB: 29-Nov-1955  09/12/2019  [name] was observed post Covid-19 immunization for 15 minutes without incident. He was provided with Vaccine Information Sheet and instruction to access the V-Safe system.   [name] was instructed to call 911 with any severe reactions post vaccine: Marland Kitchen Difficulty breathing  . Swelling of face and throat  . A fast heartbeat  . A bad rash all over body  . Dizziness and weakness   Immunizations Administered    Name Date Dose VIS Date Route   Pfizer COVID-19 Vaccine 09/12/2019  1:09 PM 0.3 mL 06/26/2018 Intramuscular   Manufacturer: [employer], [employer]   Lot: [id number]   NDC: [address]-6

## 2019-10-01 ENCOUNTER — Other Ambulatory Visit: Payer: Self-pay | Admitting: Cardiovascular Disease

## 2019-10-07 ENCOUNTER — Ambulatory Visit: Payer: Self-pay | Attending: Internal Medicine

## 2019-10-07 DIAGNOSIS — Z23 Encounter for immunization: Secondary | ICD-10-CM

## 2019-10-07 NOTE — Progress Notes (Signed)
   Covid-19 Vaccination Clinic  Name:  Luis Mccann    MRN: [id number] DOB: 1955-12-14  10/07/2019  Luis Mccann was observed post Covid-19 immunization for 15 minutes without incident. He was provided with Vaccine Information Sheet and instruction to access the V-Safe system.   Luis Mccann was instructed to call 911 with any severe reactions post vaccine: Marland Kitchen Difficulty breathing  . Swelling of face and throat  . A fast heartbeat  . A bad rash all over body  . Dizziness and weakness   Immunizations Administered    Name Date Dose VIS Date Route   Pfizer COVID-19 Vaccine 10/07/2019  1:18 PM 0.3 mL 06/26/2018 Intramuscular   Manufacturer: [employer], [employer]   Lot: [id number]   NDC: [address]-5

## 2019-10-08 ENCOUNTER — Encounter: Payer: Self-pay | Admitting: Cardiovascular Disease

## 2019-10-08 ENCOUNTER — Ambulatory Visit (INDEPENDENT_AMBULATORY_CARE_PROVIDER_SITE_OTHER): Payer: Medicare PPO | Admitting: Cardiovascular Disease

## 2019-10-08 ENCOUNTER — Other Ambulatory Visit: Payer: Self-pay

## 2019-10-08 VITALS — BP 130/70 | HR 57 | Temp 97.2°F | Ht 68.0 in | Wt 202.6 lb

## 2019-10-08 DIAGNOSIS — G4733 Obstructive sleep apnea (adult) (pediatric): Secondary | ICD-10-CM

## 2019-10-08 DIAGNOSIS — I480 Paroxysmal atrial fibrillation: Secondary | ICD-10-CM

## 2019-10-08 DIAGNOSIS — R011 Cardiac murmur, unspecified: Secondary | ICD-10-CM

## 2019-10-08 DIAGNOSIS — E663 Overweight: Secondary | ICD-10-CM

## 2019-10-08 DIAGNOSIS — I77810 Thoracic aortic ectasia: Secondary | ICD-10-CM

## 2019-10-08 DIAGNOSIS — I10 Essential (primary) hypertension: Secondary | ICD-10-CM | POA: Diagnosis not present

## 2019-10-08 NOTE — Patient Instructions (Signed)
Medication Instructions:  NO CHANGE *If you need a refill on your cardiac medications before your next appointment, please call your pharmacy*   Lab Work: If you have labs (blood work) drawn today and your tests are completely normal, you will receive your results only by: . MyChart Message (if you have MyChart) OR . A paper copy in the mail If you have any lab test that is abnormal or we need to change your treatment, we will call you to review the results.   Follow-Up: At [facility], you and your health needs are our priority.  As part of our continuing mission to provide you with exceptional heart care, we have created designated Provider Care Teams.  These Care Teams include your primary Cardiologist (physician) and Advanced Practice Providers (APPs -  Physician Assistants and Nurse Practitioners) who all work together to provide you with the care you need, when you need it.  We recommend signing up for the patient portal called "MyChart".  Sign up information is provided on this After Visit Summary.  MyChart is used to connect with patients for Virtual Visits (Telemedicine).  Patients are able to view lab/test results, encounter notes, upcoming appointments, etc.  Non-urgent messages can be sent to your provider as well.   To learn more about what you can do with MyChart, go to [email or web address].com.    Your next appointment:   12 month(s)  The format for your next appointment:   Either In Person or Virtual  Provider:   You may see Mihai Croitoru, MD or one of the following Advanced Practice Providers on your designated Care Team:    Hao Meng, PA-C  Angela Duke, PA-C or   Krista Kroeger, PA-C     

## 2019-10-08 NOTE — Progress Notes (Signed)
Cardiology Office Note    Date:  10/09/2019   ID:  Luis Mccann, DOB [birth year], MRN [id number]  PCP:  Sigmund Hazel, MD  Cardiologist:   Thurmon Fair, MD   Chief Complaint  Patient presents with  . Irregular Heart Beat    History of Present Illness:  Luis Mccann is a 64 y.o. male with remote history of a single episode of atrial fibrillation, OSA on CPAP, essential hypertension, borderline obesity, but without known structural heart disease returns for follow-up.  Has not had any cardiac complaints over the last year but is still recovering from a variety of orthopedic issues, primarily back pain related to an episode of L3-L4 discitis last year.  Also has complaints of pain in his right hip and right shoulder.  Walking is limited, but he can exercise on a stationary  He has a lot of joint problems.  He complains of pain in his right shoulder where he has had a previous subacromial resection rotator cuff repair, his right hip which has been replaced twice, lumbar spine where he has a known problem with L4, both knees.  Haemophilus L3-L4 discitis in October-November 2019.  No evidence of endocarditis by echocardiogram and blood cultures.  The patient specifically denies any chest pain at rest exertion, dyspnea at rest or with exertion, orthopnea, paroxysmal nocturnal dyspnea, syncope, palpitations, focal neurological deficits, intermittent claudication, lower extremity edema, unexplained weight gain, cough, hemoptysis or wheezing.  He reports compliance with CPAP and denies daytime hypersomnolence.  He has not managed to lose any additional weight during the coronavirus pandemic.  Past Medical History:  Diagnosis Date  . Arthritis    osteoarthritis  . Dysrhythmia 2000   single episode of afib with RVR with normal echo and rare atrial/ventricular ectopy on f/u Holter ([facility])  . History of kidney stones   . Hypertension   . Obesity (BMI 30.0-34.9) 04/04/2013  . Pneumonia    hx of  age 32  . Severe obstructive sleep apnea    do not wear mask/    Past Surgical History:  Procedure Laterality Date  . ACHILLES TENDON REPAIR Left 1987 and 1990  . COLONOSCOPY    . DOPPLER ECHOCARDIOGRAPHY  06/12/1998   normal doppler examination, normal overall LV systolic function  . HIP SURGERY Right    arthoscopic x1, one other surgery   . INCISION AND DRAINAGE HIP Right 09/24/2013   Procedure: RIGHT HIP BEARING SURFACE REVISION;  Surgeon: Loanne Drilling, MD;  Location: [facility];  Service: Orthopedics;  Laterality: Right;  . JOINT REPLACEMENT  1995   right hip  . KIDNEY STONE SURGERY    . LITHOTRIPSY     x4  . NASAL SEPTOPLASTY W/ TURBINOPLASTY Bilateral 01/02/2013   Procedure: NASAL SEPTOPLASTY WITH TURBINATE REDUCTION;  Surgeon: Flo Shanks, MD;  Location: [facility] OR;  Service: ENT;  Laterality: Bilateral;  . NASAL SEPTUM SURGERY  1980's  . RENAL ARTERY DUPLEX  09/22/2004   abdominal aorta-normal taper with no suggestion of aneurysmal dilatation/diameter reduction. normal patency of right and left kidney arteries. right and left kidneys equal in size, symmetrical in shape with normal cortex and medulla with normal resistance indices and normal echogencity with no evidence of hydronephrosis.  Marland Kitchen SHOULDER SURGERY Left 1990's   for burr  . SHOULDER SURGERY Right 2010, 2012   abuttment and rotator cuff repair  . SLEEP STUDY  10/25/2011   AHI during total sleep time - 88.39/hr, AHI during REM 75.00/hr  Current Medications: Outpatient Medications Prior to Visit  Medication Sig Dispense Refill  . amLODipine (NORVASC) 10 MG tablet TAKE 1 TABLET EVERY DAY 90 tablet 0  . Ascorbic Acid (VITAMIN C) 1000 MG tablet Take 1,000 mg by mouth daily.    . Ferrous Sulfate (IRON) 28 MG TABS Take 1 tablet by mouth. Monday Wednesday Friday    . Multiple Vitamins-Minerals (MULTIVITAMIN ADULT PO) Take by mouth.    . olmesartan-hydrochlorothiazide (BENICAR HCT) 40-25 MG tablet Take 1 tablet by mouth daily.  90 tablet 3  . oxyCODONE-acetaminophen (PERCOCET/ROXICET) 5-325 MG tablet Take by mouth.    . spironolactone (ALDACTONE) 25 MG tablet TAKE 1 TABLET BY MOUTH DAILY. 90 tablet 0  . traMADol (ULTRAM) 50 MG tablet Take 50 mg by mouth every 6 (six) hours as needed.     No facility-administered medications prior to visit.     Allergies:   Patient has no known allergies.   Social History   Socioeconomic History  . Marital status: Married    Spouse name: Not on file  . Number of children: Not on file  . Years of education: Not on file  . Highest education level: Not on file  Occupational History  . Not on file  Tobacco Use  . Smoking status: Former Smoker    Types: Cigarettes  . Smokeless tobacco: Never Used  . Tobacco comment: quit 30 years ago-social smoker  Substance and Sexual Activity  . Alcohol use: Yes    Comment: occasional  . Drug use: No  . Sexual activity: Not on file  Other Topics Concern  . Not on file  Social History Narrative  . Not on file   Social Determinants of Health   Financial Resource Strain:   . Difficulty of Paying Living Expenses:   Food Insecurity:   . Worried About Programme researcher, broadcasting/film/video in the Last Year:   . Barista in the Last Year:   Transportation Needs:   . Freight forwarder (Medical):   Marland Kitchen Lack of Transportation (Non-Medical):   Physical Activity:   . Days of Exercise per Week:   . Minutes of Exercise per Session:   Stress:   . Feeling of Stress :   Social Connections:   . Frequency of Communication with Friends and Family:   . Frequency of Social Gatherings with Friends and Family:   . Attends Religious Services:   . Active Member of Clubs or Organizations:   . Attends Banker Meetings:   Marland Kitchen Marital Status:      Family History:  The patient's family history includes Cancer (age of onset: 33) in his sister.   ROS:   Please see the history of present illness.    ROS all other systems are reviewed and are  negative   PHYSICAL EXAM:   VS:  BP 130/70   Pulse (!) 57   Temp (!) 97.2 F (36.2 C)   Ht 5\' 8"  (1.727 m)   Wt 202 lb 9.6 oz (91.9 kg)   SpO2 95%   BMI 30.81 kg/m      General: Alert, oriented x3, no distress, obese Head: no evidence of trauma, PERRL, EOMI, no exophtalmos or lid lag, no myxedema, no xanthelasma; normal ears, nose and oropharynx Neck: normal jugular venous pulsations and no hepatojugular reflux; brisk carotid pulses without delay and no carotid bruits Chest: clear to auscultation, no signs of consolidation by percussion or palpation, normal fremitus, symmetrical and full respiratory excursions Cardiovascular:  normal position and quality of the apical impulse, regular rhythm, normal first and second heart sounds, early peaking 2/6 systolic ejection murmur heard up and down the right sternal border.  No diastolic murmurs, rubs or gallops Abdomen: no tenderness or distention, no masses by palpation, no abnormal pulsatility or arterial bruits, normal bowel sounds, no hepatosplenomegaly Extremities: no clubbing, cyanosis or edema; 2+ radial, ulnar and brachial pulses bilaterally; 2+ right femoral, posterior tibial and dorsalis pedis pulses; 2+ left femoral, posterior tibial and dorsalis pedis pulses; no subclavian or femoral bruits Neurological: grossly nonfocal Psych: Normal mood and affect   Wt Readings from Last 3 Encounters:  10/08/19 202 lb 9.6 oz (91.9 kg)  05/01/18 195 lb 9.6 oz (88.7 kg)  04/11/18 190 lb (86.2 kg)      Studies/Labs Reviewed:   EKG:  EKG is ordered today.  Normal sinus rhythm, normal normal tracing.  Last year the QRS was slightly broad in a pattern of incomplete left bundle branch block Recent Labs: No results found for requested labs within last 8760 hours.    ASSESSMENT:    1. Paroxysmal atrial fibrillation (HCC)   2. Essential hypertension   3. OSA (obstructive sleep apnea)   4. Overweight   5. Cardiac murmur   6. Mild dilation of  ascending aorta (HCC)      PLAN:  In order of problems listed above:  1. AFib: Has not been documented except for the initial episode that occurred 15 years ago.  Embolic risk is low-moderate. CHADSVasc 2 (HTN, just turned 64).  Not on anticoagulants. 2. HTN: Well controlled on 4 agents. 3. OSA: on CPAP, compliant and with good clinical response. 4. Overweight: committed to restarting weight loss attempts. 5. Murmur: no major valve issues on echo 2020.  6. Mildly dilated ascending aorta by echo. Consider more accurate measurement by CT. Likely HTN related    Medication Adjustments/Labs and Tests Ordered: Current medicines are reviewed at length with the patient today.  Concerns regarding medicines are outlined above.  Medication changes, Labs and Tests ordered today are listed in the Patient Instructions below. Patient Instructions  Medication Instructions:  NO CHANGE *If you need a refill on your cardiac medications before your next appointment, please call your pharmacy*   Lab Work: If you have labs (blood work) drawn today and your tests are completely normal, you will receive your results only by: Marland Kitchen MyChart Message (if you have MyChart) OR . A paper copy in the mail If you have any lab test that is abnormal or we need to change your treatment, we will call you to review the results.   Follow-Up: At [facility], you and your health needs are our priority.  As part of our continuing mission to provide you with exceptional heart care, we have created designated Provider Care Teams.  These Care Teams include your primary Cardiologist (physician) and Advanced Practice Providers (APPs -  Physician Assistants and Nurse Practitioners) who all work together to provide you with the care you need, when you need it.  We recommend signing up for the patient portal called "MyChart".  Sign up information is provided on this After Visit Summary.  MyChart is used to connect with patients for  Virtual Visits (Telemedicine).  Patients are able to view lab/test results, encounter notes, upcoming appointments, etc.  Non-urgent messages can be sent to your provider as well.   To learn more about what you can do with MyChart, go to [email or web address].    Your  next appointment:   12 month(s)  The format for your next appointment:   Either In Person or Virtual  Provider:   You may see Sanda Klein, MD or one of the following Advanced Practice Providers on your designated Care Team:    Almyra Deforest, PA-C  Fabian Sharp, [place] or   Roby Lofts, PA-C        Signed, Sanda Klein, MD  10/09/2019 8:09 AM    [address], [place], [place]  [address] Phone: [phone]; Fax: [phone]

## 2019-10-09 ENCOUNTER — Encounter: Payer: Self-pay | Admitting: Cardiovascular Disease

## 2019-10-09 DIAGNOSIS — I77810 Thoracic aortic ectasia: Secondary | ICD-10-CM | POA: Insufficient documentation

## 2019-12-13 ENCOUNTER — Other Ambulatory Visit: Payer: Self-pay | Admitting: Cardiovascular Disease

## 2020-01-15 ENCOUNTER — Other Ambulatory Visit: Payer: Self-pay | Admitting: Urology

## 2020-01-23 ENCOUNTER — Other Ambulatory Visit (HOSPITAL_COMMUNITY)
Admission: RE | Admit: 2020-01-23 | Discharge: 2020-01-23 | Disposition: A | Discharge status: Home / Self Care | Payer: Medicare PPO | Source: Ambulatory Visit | Attending: Urology | Admitting: Urology

## 2020-01-23 DIAGNOSIS — Z20822 Contact with and (suspected) exposure to covid-19: Secondary | ICD-10-CM | POA: Insufficient documentation

## 2020-01-23 DIAGNOSIS — Z01812 Encounter for preprocedural laboratory examination: Secondary | ICD-10-CM | POA: Insufficient documentation

## 2020-01-23 LAB — SARS CORONAVIRUS 2 (TAT 6-24 HRS): SARS Coronavirus 2: NEGATIVE

## 2020-01-23 NOTE — H&P (Signed)
Office Visit Report     01/14/2020   --------------------------------------------------------------------------------   Luis Mccann  MRN: [id number]  DOB: Jul 19, 1955, 64 year old Male  SSN: [id number]   PRIMARY CARE:  Sigmund Hazel, MD  REFERRING:    PROVIDER:  Heloise Purpura, M.D.  TREATING:  Anne Fu, NP  LOCATION:  [facility] [phone]     --------------------------------------------------------------------------------   CC/HPI: 1. Elevated PSA  2. Hematospermia   04/2019: Luis Mccann follows up today for continued evaluation of his history of elevated PSA. His PSA has fluctuated over the years. He had undergone a biopsy that was negative for malignancy in 2015. He has denied any gross hematuria but has continued to have recurrent episodes of hematospermia which have been very disconcerting to him despite reassurance. He states that he recently passed a few small fragments that he thinks might be stone material and may have seen a small amount of blood clot in his urine.   01/14/2020: Microscopic hematuria noted on urinalysis at time of last office visit exam. CT evaluation for such with cystoscopy was discussed but ultimately deferred in favor of patient returning for repeat urinalysis a few weeks after time of last office visit. A repeat urinalysis never was obtained. He presents today for concerns related to gross hematuria as well as flank pain.   Symptoms have been intermittently occurring for the past several months but now worsening in severity over the past week. He describes pain in the left lower back and left lower flank without modifying or alleviating factors. Also associated with intermittent passage of hematuria especially at night. He has had some mild burning with urination. He denies any interval stone material passage. Otherwise voiding at his baseline with stable, grossly non bothersome symptomatology. Not associated with fevers or chills,  nausea/vomiting. The patient does have a past history of nephrolithiasis but no recent upper track imaging.     ALLERGIES: No Allergies    MEDICATIONS: Acetaminophen  AmLODIPine Besylate 10 MG Oral Tablet Oral  Iron 325 (65 Fe) MG Oral Tablet Oral  Olmesartan Medoxomil  Oxycodone Hcl  Spironolactone 25 mg tablet     GU PSH: Cysto Uretero Lithotripsy - 2011 ESWL - 2015       PSH Notes: Lithotripsy, Upper Gastrointestinal Endoscopy (Therapeutic), Cystoscopy With Ureteroscopy With Lithotripsy, Rotator Cuff Repair, Primary Repair Of Ruptured Achilles Tendon, Total Hip Replacement   NON-GU PSH: Repair Achilles Tendon - 2008 Total Hip Replacement - 2008     GU PMH: Microscopic hematuria - 04/24/2019 Hematospermia - 12/17/2018 Elevated PSA, Elevated prostate specific antigen (PSA) - 2017 Renal calculus, Nephrolithiasis - 2017 Prostate nodule w/o LUTS, Prostate nodule - 2016 Encounter for Prostate Cancer screening, Prostate cancer screening - 2014 Neoplasm of unspecified behavior of unspecified kidney, Renal neoplasm - 2014 Ureteral calculus, Calculus of ureter - 2014      PMH Notes:   1) Urolithiasis: He has been treated with ESWL multiple times in the past prior to being under my care. He has known hypercalciuria and mild hyperoxaluria.   Medical treatment: HCTZ with antihypertensive regimen, dietary changes   Aug 2011: Left ureteroscopic laser lithotripsy (calcium oxalate dihydrate)  Apr 2015: ESWL of 6 mm left renal calculus  Aug 2015: 24 hr urine - low urine volume   2) Complex right renal cyst: He has a Bosniak II right renal cyst off the superior aspect of the kidney which has been stable on surveillance imaging.   3) Prostate nodule/elevated PSA:  He was found to have a rising PSA and new 1 cm left apical prostate nodule in January 2015.   Feb 2015: 12 core biopsy - negative except for HGPIN at left base , vol 74.9 cc     NON-GU PMH: Hypercalciuria, Hypercalciuria -  2015 Personal history of diseases of the blood and blood-forming organs and certain disorders involving the immune mechanism, History of anemia - 2014 Atrial Fibrillation Hypertension    FAMILY HISTORY: Cancer - Sister   SOCIAL HISTORY: Marital Status: Married Preferred Language: English; Ethnicity: Not Hispanic Or Latino; Race: White Current Smoking Status: Patient does not smoke anymore. Has not smoked since 05/02/1986.   Tobacco Use Assessment Completed: Used Tobacco in last 30 days? Does not drink anymore.  Drinks 1 caffeinated drink per day.     Notes: Occupation:, Marital History - Currently Married, Tobacco Use   REVIEW OF SYSTEMS:    GU Review Male:   Patient reports burning/ pain with urination. Patient denies frequent urination, hard to postpone urination, get up at night to urinate, leakage of urine, stream starts and stops, trouble starting your stream, have to strain to urinate , erection problems, and penile pain.  Gastrointestinal (Upper):   Patient denies indigestion/ heartburn, nausea, and vomiting.  Gastrointestinal (Lower):   Patient denies diarrhea and constipation.  Constitutional:   Patient denies fever, night sweats, weight loss, and fatigue.  Skin:   Patient denies skin rash/ lesion and itching.  Eyes:   Patient denies blurred vision and double vision.  Ears/ Nose/ Throat:   Patient denies sore throat and sinus problems.  Hematologic/Lymphatic:   Patient denies swollen glands and easy bruising.  Cardiovascular:   Patient denies leg swelling and chest pains.  Respiratory:   Patient denies cough and shortness of breath.  Endocrine:   Patient denies excessive thirst.  Musculoskeletal:   Patient reports back pain. Patient denies joint pain.  Neurological:   Patient denies headaches and dizziness.  Psychologic:   Patient denies depression and anxiety.   Notes: gross hematuria x3-4 months.     VITAL SIGNS:      01/14/2020 02:54 PM  Weight 205 lb / 92.99 kg   Height 69 in / 175.26 cm  BP 101/66 mmHg  Pulse 52 /min  Temperature 98.0 F / 36.6 C  BMI 30.3 kg/m   MULTI-SYSTEM PHYSICAL EXAMINATION:    Constitutional: Well-nourished. No physical deformities. Normally developed. Good grooming.  Neck: Neck symmetrical, not swollen. Normal tracheal position.  Respiratory: No labored breathing, no use of accessory muscles.   Cardiovascular: Normal temperature, normal extremity pulses, no swelling, no varicosities.  Skin: No paleness, no jaundice, no cyanosis. No lesion, no ulcer, no rash.  Neurologic / Psychiatric: Oriented to time, oriented to place, oriented to person. No depression, no anxiety, no agitation.  Gastrointestinal: No mass, no tenderness, no rigidity, non obese abdomen. NO CVA, flank, lower abdominal tenderness.   Musculoskeletal: Normal gait and station of head and neck.     Complexity of Data:  Source Of History:  Patient, Medical Record Summary  Lab Test Review:   PSA  Records Review:   Pathology Reports, Previous Doctor Records, Previous Hospital Records, Previous Patient Records  Urine Test Review:   Urinalysis  X-Ray Review: C.T. Stone Protocol: Reviewed Films. Discussed With Patient.     04/18/19 04/11/18 01/04/17 05/17/16 05/14/15 05/14/15 11/12/14 08/13/14  PSA  Total PSA 4.09 ng/mL 2.65 ng/mL 4.01 ng/mL 5.14 ng/dl 4.31  5.40  0.86  7.61   Free  PSA 0.56 ng/mL  0.71 ng/mL 0.75 ng/dl   1.610.76    % Free PSA 14 % PSA  18 % PSA 15 %   15      PROCEDURES:         C.T. Urogram - [id number]      Patient confirmed No Neulasta OnPro Device.         Urinalysis w/Scope Dipstick Dipstick Cont'd Micro  Color: Brown Bilirubin: Neg mg/dL WBC/hpf: 6 - 09/UEA10/hpf  Appearance: Turbid Ketones: Trace mg/dL RBC/hpf: >54/UJW>60/hpf  Specific Gravity: 1.020 Blood: 3+ ery/uL Bacteria: Rare (0-9/hpf)  pH: 6.0 Protein: 3+ mg/dL Cystals: NS (Not Seen)  Glucose: Neg mg/dL Urobilinogen: 1.0 mg/dL Casts: NS (Not Seen)    Nitrites: Neg Trichomonas: Not  Present    Leukocyte Esterase: 2+ leu/uL Mucous: Not Present      Epithelial Cells: NS (Not Seen)      Yeast: NS (Not Seen)      Sperm: Not Present    Notes: unspun microscopic performed    ASSESSMENT:      ICD-10 Details  1 GU:   Flank Pain - R10.84 Left, Undiagnosed New Problem  2   Gross hematuria - R31.0 Undiagnosed New Problem  3   Renal calculus - N20.0 Bilateral, Undiagnosed New Problem - Patient with bilateral renal calculi but there is a large 15 mm wide renal collecting system calculi on the left side noted on today's exam. Easily seen on CT scout imaging.  4   Renal cyst - N28.1 Right, Chronic, Worsening - There is a large right renal cyst on the upper pole that is of indeterminate in characteristics based on today's non-contrasted study. Final radiological interpretation is pending. This may need to be further evaluated with contrasted CT study or MRI of the abdomen. This was noted to be around 3cm in size 10 years ago but has increased by at least 2-3 cm on today's study.   PLAN:           Orders Labs Urine Culture  X-Rays: C.T. Stone Protocol Without Contrast  X-Ray Notes: History:  Hematuria: Yes/No  Patient to see MD after exam: Yes/No  Previous exam: CT / IVP/ US/ KUB/ None  When:  Where:  Diabetic: Yes/ No  BUN/ Creatine:  Date of last BUN Creatinine:  Weight in pounds:  Allergy- Contrasts/ Shellfish: Yes/ No  Conflicting diabetic meds: Yes/ No  Oral contrast and instructions given to patient:   Prior Authorization #: [id number] valid 01/14/20 thru 02/13/20            Schedule Return Visit/Planned Activity: Other See Visit Notes - F/U Pending Results, Follow up MD          Document Letter(s):  Created for Patient: Clinical Summary         Notes:   The large left renal collecting system calculi seen on today's exam is likely the source of his increased left sided pain and hematuria. Fortunately symptoms specifically pain have only been mild  to moderate in severity and manageable by patient report. I did not visualize any additional ureteral calculi on today's examination.  Based on stones location and size, it will likely need to be treated in the near future.   We discussed the management of urinary stones. These options include observation, ureteroscopy, shockwave lithotripsy, and PCNL. We discussed which options are relevant to these particular stones. We discussed the natural history of stones as well as the complications of untreated stones and the impact  on quality of life without treatment as well as with each of the above listed treatments. We also discussed the efficacy of each treatment in its ability to clear the stone burden. With any of these management options I discussed the signs and symptoms of infection and the need for emergent treatment should these be experienced. For each option we discussed the ability of each procedure to clear the patient of their stone burden.   For ureteroscopy I described the risks which include heart attack, stroke, pulmonary embolus, death, bleeding, infection, damage to contiguous structures, positioning injury, ureteral stricture, ureteral avulsion, ureteral injury, need for ureteral stent, inability to perform ureteroscopy, need for an interval procedure, inability to clear stone burden, stent discomfort and pain.   For shockwave lithotripsy I described the risks which include arrhythmia, kidney contusion, kidney hemorrhage, need for transfusion, long-term risk of diabetes or hypertension, back discomfort, flank ecchymosis, flank abrasion, inability to break up stone, inability to pass stone fragments, Steinstrasse, infection associated with obstructing stones, need for different surgical procedure and possible need for repeat shockwave lithotripsy.   For PCNL I described the risks including heart attack, sure, pulmonary embolus, death, positioning injury, pneumothorax, hydrothorax, need for  chest tube, inability to clear stone burden, renal laceration, arterial venous fistula or malformation, need for embolization of kidney, loss of kidney or renal function, need for repeat procedure, need for prolonged nephrostomy tube, ureteral avulsion and fistula.   I will discuss patient's presentation and CT imaging today with his urologist. The patient will be contacted with recommended plan of care moving forward. He is agreeable to proceed with Dr Vevelyn Royals recommendation. If felt to be indicated, once his current stone event has been treated, additional attention can be may toward the enlarging but no right renal cyst which may include more detailed imaging in the form of CT abdomen/pelvis with contrast or an MRI.   Urine c/s sent today, if indicated appropriate antimicrobial therapy will be prescribed.    CT imaging reviewed with Dr. Laverle Patter. Treatment recommendation given today. Shockwave lithotripsy would be a reasonable option given stones density and location. Ureteroscopy obviously would be more definitive and require ureteral stenting. If patient elects to proceed with shockwave lithotripsy, on follow-up he can have a repeat CT imaging with contrast to better characterize the enlarging right renal cyst seen on today's non contrasted study. I will contact the patient regarding our discussion before proceeding with scheduling for stone treatment.        Next Appointment:      Next Appointment: 04/15/2020 02:00 PM    Appointment Type: Laboratory Appointment    Location: [facility] [phone]    Provider: Lab LAB    Reason for Visit: 1 yr PSA with reflex-Lanorris Kalisz      * Signed by Anne Fu, NP on 01/15/20 at 9:33 AM (EDT)*

## 2020-01-24 NOTE — Progress Notes (Signed)
Patient to arrive at 0915 on 01/27/2020. History and medications reviewed. Pre-procedure instructions given. NPO after MN. BP medications  with sip of water in am. Driver secured.

## 2020-01-27 ENCOUNTER — Ambulatory Visit (HOSPITAL_COMMUNITY): Payer: Medicare PPO

## 2020-01-27 ENCOUNTER — Encounter (HOSPITAL_BASED_OUTPATIENT_CLINIC_OR_DEPARTMENT_OTHER)
Admission: RE | Disposition: A | Discharge status: Home / Self Care | Payer: Self-pay | Source: Home / Self Care | Attending: Urology

## 2020-01-27 ENCOUNTER — Ambulatory Visit (HOSPITAL_BASED_OUTPATIENT_CLINIC_OR_DEPARTMENT_OTHER)
Admission: RE | Admit: 2020-01-27 | Discharge: 2020-01-27 | Disposition: A | Discharge status: Home / Self Care | Payer: Medicare PPO | Attending: Urology | Admitting: Urology

## 2020-01-27 ENCOUNTER — Other Ambulatory Visit: Payer: Self-pay

## 2020-01-27 ENCOUNTER — Encounter (HOSPITAL_BASED_OUTPATIENT_CLINIC_OR_DEPARTMENT_OTHER): Payer: Self-pay | Admitting: Urology

## 2020-01-27 DIAGNOSIS — Z87442 Personal history of urinary calculi: Secondary | ICD-10-CM | POA: Diagnosis not present

## 2020-01-27 DIAGNOSIS — Z79899 Other long term (current) drug therapy: Secondary | ICD-10-CM | POA: Insufficient documentation

## 2020-01-27 DIAGNOSIS — N200 Calculus of kidney: Secondary | ICD-10-CM | POA: Insufficient documentation

## 2020-01-27 DIAGNOSIS — G4730 Sleep apnea, unspecified: Secondary | ICD-10-CM | POA: Diagnosis not present

## 2020-01-27 DIAGNOSIS — I10 Essential (primary) hypertension: Secondary | ICD-10-CM | POA: Diagnosis not present

## 2020-01-27 DIAGNOSIS — Z87891 Personal history of nicotine dependence: Secondary | ICD-10-CM | POA: Diagnosis not present

## 2020-01-27 HISTORY — PX: EXTRACORPOREAL SHOCK WAVE LITHOTRIPSY: SHX1557

## 2020-01-27 SURGERY — LITHOTRIPSY, ESWL
Anesthesia: LOCAL | Laterality: Left

## 2020-01-27 MED ORDER — DIAZEPAM 5 MG PO TABS
10.0000 mg | ORAL_TABLET | ORAL | Status: AC
  Administered 2020-01-27: 10 mg via ORAL

## 2020-01-27 MED ORDER — SODIUM CHLORIDE 0.9 % IV SOLN: INTRAVENOUS | Status: DC

## 2020-01-27 MED ORDER — DIAZEPAM 5 MG PO TABS
ORAL_TABLET | ORAL | Status: AC
  Filled 2020-01-27: qty 2

## 2020-01-27 MED ORDER — CIPROFLOXACIN HCL 500 MG PO TABS
ORAL_TABLET | ORAL | Status: AC
  Filled 2020-01-27: qty 1

## 2020-01-27 MED ORDER — DIPHENHYDRAMINE HCL 25 MG PO CAPS
25.0000 mg | ORAL_CAPSULE | ORAL | Status: AC
  Administered 2020-01-27: 25 mg via ORAL

## 2020-01-27 MED ORDER — DIAZEPAM 5 MG PO TABS
ORAL_TABLET | ORAL | Status: AC
  Filled 2020-01-27: qty 1

## 2020-01-27 MED ORDER — TAMSULOSIN HCL 0.4 MG PO CAPS: 0.4000 mg | ORAL_CAPSULE | Freq: Every day | ORAL | 0 refills | Status: DC

## 2020-01-27 MED ORDER — CIPROFLOXACIN HCL 500 MG PO TABS
500.0000 mg | ORAL_TABLET | ORAL | Status: AC
  Administered 2020-01-27: 500 mg via ORAL

## 2020-01-27 MED ORDER — DIPHENHYDRAMINE HCL 25 MG PO CAPS
ORAL_CAPSULE | ORAL | Status: AC
  Filled 2020-01-27: qty 1

## 2020-01-27 NOTE — Op Note (Signed)
See [facility] operative note scanned into chart. Also because of the size, density, location and other factors that cannot be anticipated I feel this will likely be a staged procedure. This fact supersedes any indication in the scanned [facility] operative note to the contrary.  

## 2020-01-27 NOTE — Interval H&P Note (Signed)
History and Physical Interval Note:  01/27/2020 9:04 AM  Luis Mccann  has presented today for surgery, with the diagnosis of LEFT RENAL CALCULI.  The various methods of treatment have been discussed with the patient and family. After consideration of risks, benefits and other options for treatment, the patient has consented to  Procedure(s): EXTRACORPOREAL SHOCK WAVE LITHOTRIPSY (ESWL) (Left) as a surgical intervention.  The patient's history has been reviewed, patient examined, no change in status, stable for surgery.  I have reviewed the patient's chart and labs.  Questions were answered to the patient's satisfaction.     [employer]

## 2020-01-27 NOTE — Discharge Instructions (Signed)
Lithotripsy, Care After This sheet gives you information about how to care for yourself after your procedure. Your health care provider may also give you more specific instructions. If you have problems or questions, contact your health care provider. What can I expect after the procedure? After the procedure, it is common to have:  Some blood in your urine. This should only last for a few days.  Soreness in your back, sides, or upper abdomen for a few days.  Blotches or bruises on your back where the pressure wave entered the skin.  Pain, discomfort, or nausea when pieces (fragments) of the kidney [place] move through the tube that carries urine from the kidney to the bladder (ureter). W[place] fragments may pass soon after the procedure, but they may continue to pass for up to 4-8 weeks. ? If you have severe pain or nausea, contact your health care provider. This may be caused by a large [place] that was not broken up, and this may mean that you need more treatment.  Some pain or discomfort during urination.  Some pain or discomfort in the lower abdomen or (in men) at the base of the penis. Follow these instructions at home: Medicines  Take over-the-counter and prescription medicines only as told by your health care provider.  If you were prescribed an antibiotic medicine, take it as told by your health care provider. Do not stop taking the antibiotic even if you start to feel better.  Do not drive for 24 hours if you were given a medicine to help you relax (sedative).  Do not drive or use heavy machinery while taking prescription pain medicine. Eating and drinking      Drink enough water and fluids to keep your urine clear or pale yellow. This helps any remaining pieces of the [place] to pass. It can also help prevent new stones from forming.  Eat plenty of fresh fruits and vegetables.  Follow instructions from your health care provider about eating and drinking restrictions. You may be  instructed: ? To reduce how much salt (sodium) you eat or drink. Check ingredients and nutrition facts on packaged foods and beverages. ? To reduce how much meat you eat.  Eat the recommended amount of calcium for your age and gender. Ask your health care provider how much calcium you should have. General instructions  Get plenty of rest.  Most people can resume normal activities 1-2 days after the procedure. Ask your health care provider what activities are safe for you.  Your health care provider may direct you to lie in a certain position (postural drainage) and tap firmly (percuss) over your kidney area to help [place] fragments pass. Follow instructions as told by your health care provider.  If directed, strain all urine through the strainer that was provided by your health care provider. ? Keep all fragments for your health care provider to see. Any stones that are found may be sent to a medical lab for examination. The [place] may be as small as a grain of salt.  Keep all follow-up visits as told by your health care provider. This is important. Contact a health care provider if:  You have pain that is severe or does not get better with medicine.  You have nausea that is severe or does not go away.  You have blood in your urine longer than your health care provider told you to expect.  You have more blood in your urine.  You have pain during urination that does   not go away.  You urinate more frequently than usual and this does not go away.  You develop a rash or any other possible signs of an allergic reaction. Get help right away if:  You have severe pain in your back, sides, or upper abdomen.  You have severe pain while urinating.  Your urine is very dark red.  You have blood in your stool (feces).  You cannot pass any urine at all.  You feel a strong urge to urinate after emptying your bladder.  You have a fever or chills.  You develop shortness of breath,  difficulty breathing, or chest pain.  You have severe nausea that leads to persistent vomiting.  You faint. Summary  After this procedure, it is common to have some pain, discomfort, or nausea when pieces (fragments) of the kidney stone move through the tube that carries urine from the kidney to the bladder (ureter). If this pain or nausea is severe, however, you should contact your health care provider.  Most people can resume normal activities 1-2 days after the procedure. Ask your health care provider what activities are safe for you.  Drink enough water and fluids to keep your urine clear or pale yellow. This helps any remaining pieces of the stone to pass, and it can help prevent new stones from forming.  If directed, strain your urine and keep all fragments for your health care provider to see. Fragments or stones may be as small as a grain of salt.  Get help right away if you have severe pain in your back, sides, or upper abdomen or have severe pain while urinating. This information is not intended to replace advice given to you by your health care provider. Make sure you discuss any questions you have with your health care provider. Document Revised: 07/30/2018 Document Reviewed: 03/09/2016 Elsevier Patient Education  2020 [employer]. 1. You should strain your urine and collect all fragments and bring them to your follow up appointment.  2. You should take your pain medication as needed.  Please call if your pain is severe to the point that it is not controlled with your pain medication. 3. You should call if you develop fever > 101 or persistent nausea or vomiting. 4. Your doctor may prescribe tamsulosin to take to help facilitate stone passage.

## 2020-01-28 ENCOUNTER — Encounter (HOSPITAL_BASED_OUTPATIENT_CLINIC_OR_DEPARTMENT_OTHER): Payer: Self-pay | Admitting: Urology

## 2020-02-24 ENCOUNTER — Other Ambulatory Visit: Payer: Self-pay | Admitting: Cardiovascular Disease

## 2020-06-26 ENCOUNTER — Other Ambulatory Visit: Payer: Self-pay | Admitting: Cardiovascular Disease

## 2020-07-11 IMAGING — CT CT GUIDANCE NEEDLE PLACEMENT
1 of 2 series · 14 of 32 positions shown, 18 images · non-contrast
Comparison: MRI of the lumbar spine at [REDACTED] on
01/18/2018.

INDICATION: Discitis at L3-4 with associated evidence by imaging of left-sided
paraspinous infection extending into the paraspinous musculature and
left psoas muscle. The patient presents for image guided aspiration
for diagnostic purposes.

EXAM:
CT GUIDED ASPIRATION OF LEFT PARASPINOUS PSOAS MUSCLE

[Series 2: i-spiral 5.0 b40f · axial · 0.72mm/px · z∈[+854,+1096]mm · 14 of 77 slices shown, 18 images]
[im 4/77  soft-tissue]
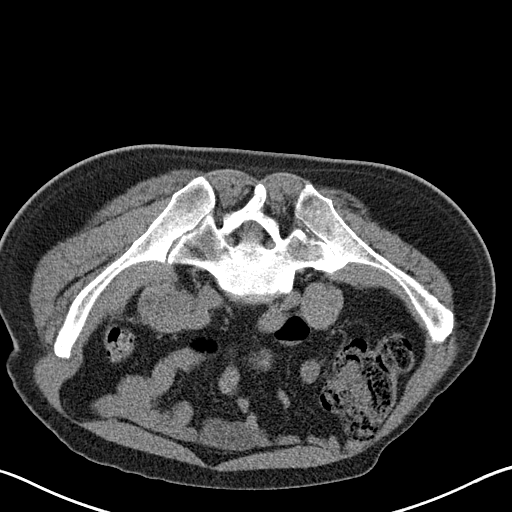
[im 4/77  bone]
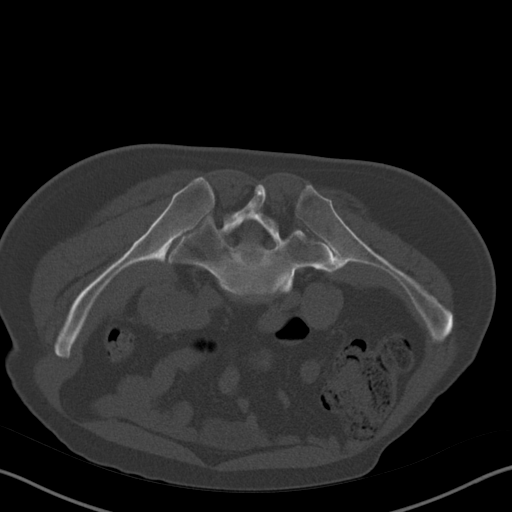
[im 10/77  soft-tissue]
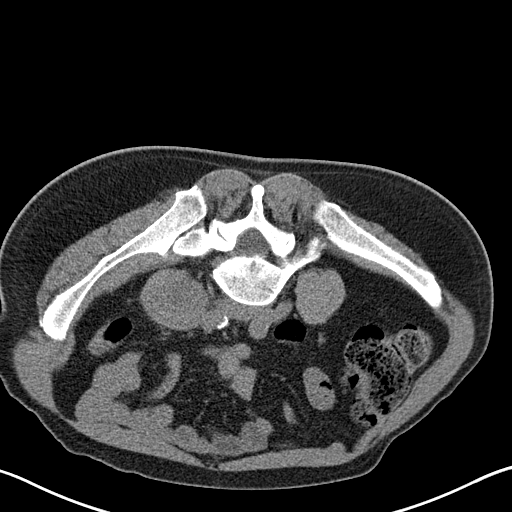
[im 17/77  soft-tissue]
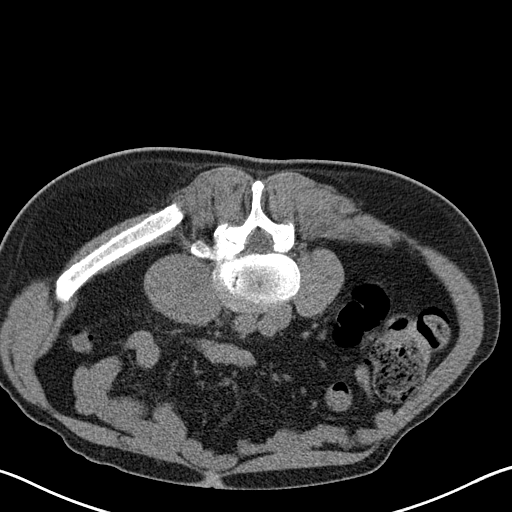
[im 24/77  soft-tissue]
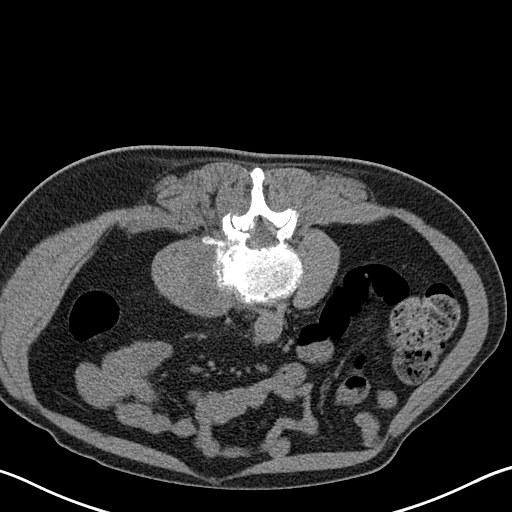
[im 30/77  soft-tissue]
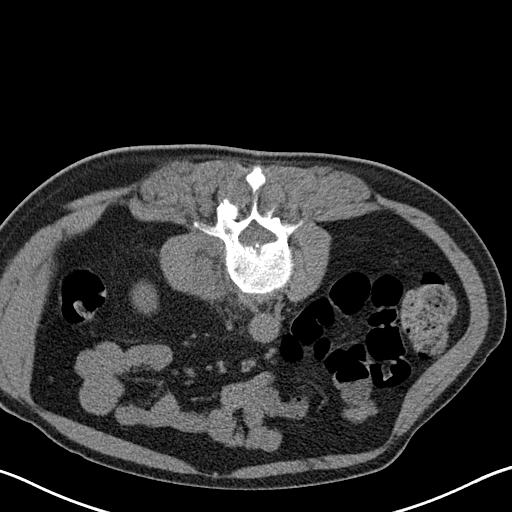
[im 37/77  soft-tissue]
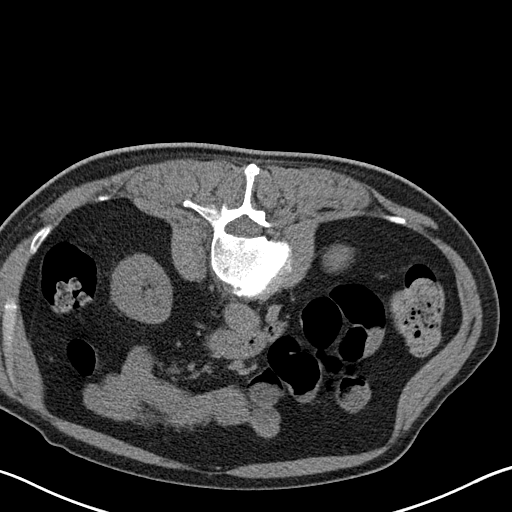
[im 40/77  soft-tissue]
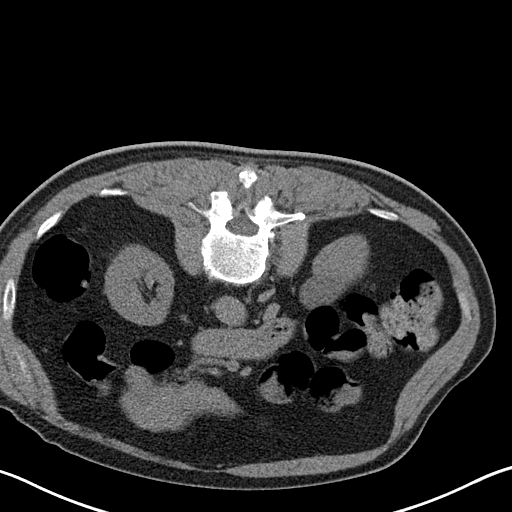
[im 47/77  soft-tissue]
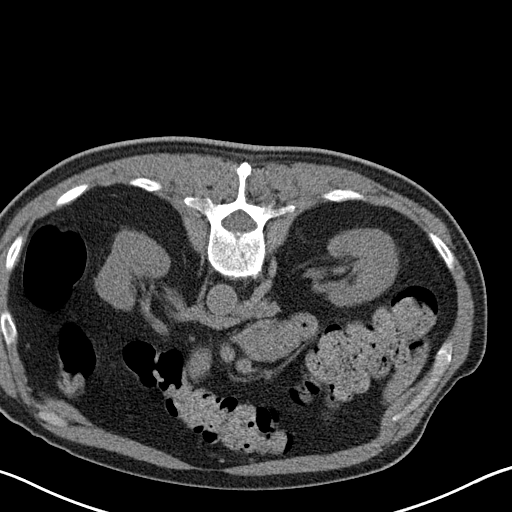
[im 53/77  soft-tissue]
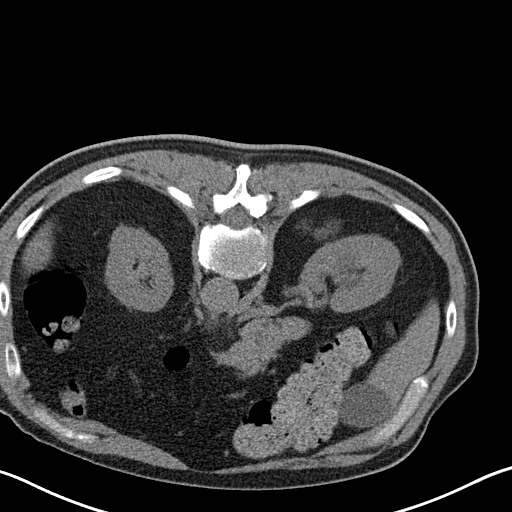
[im 53/77  bone]
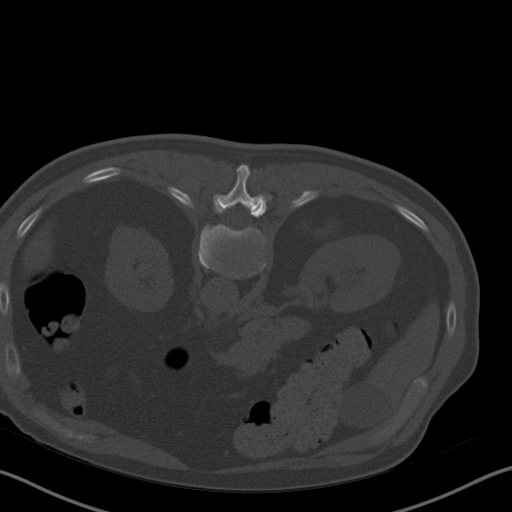
[im 60/77  soft-tissue]
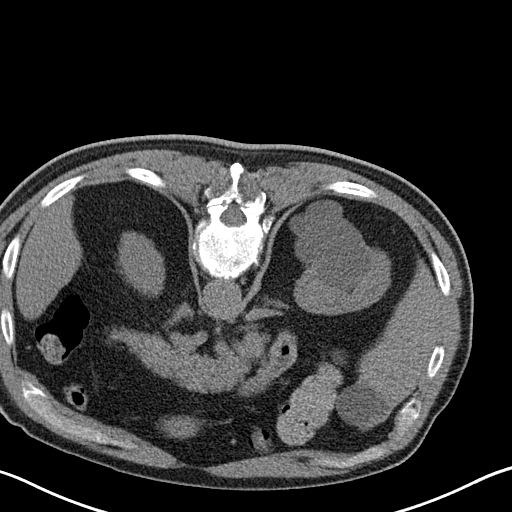
[im 63/77  lung]
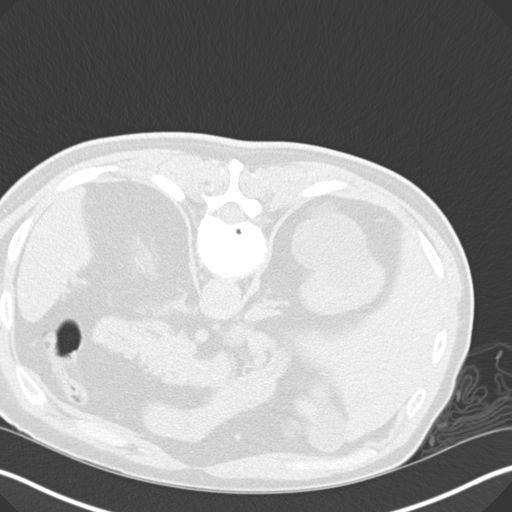
[im 67/77  soft-tissue]
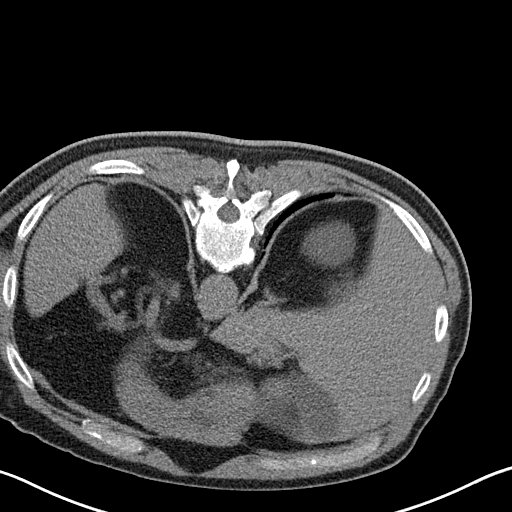
[im 67/77  lung]
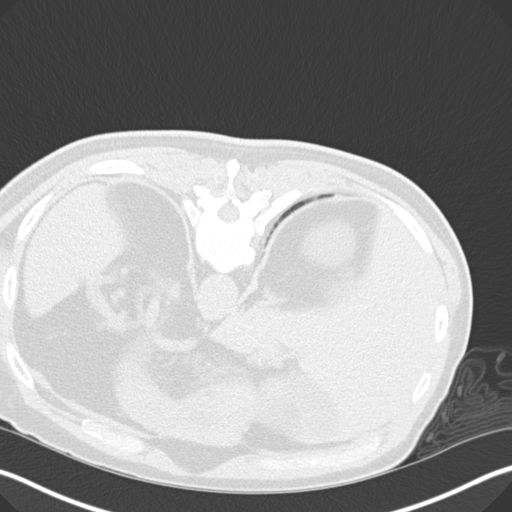
[im 70/77  lung]
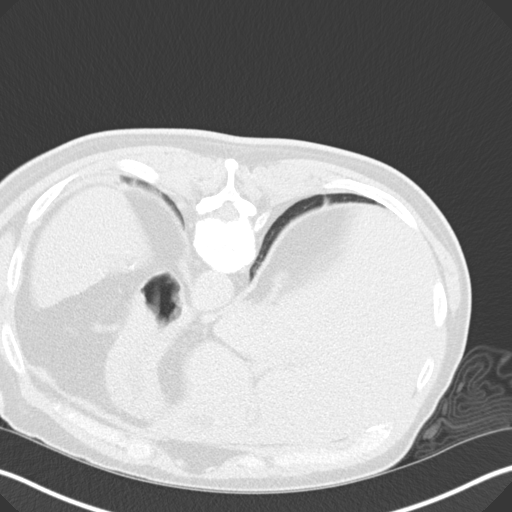
[im 73/77  soft-tissue]
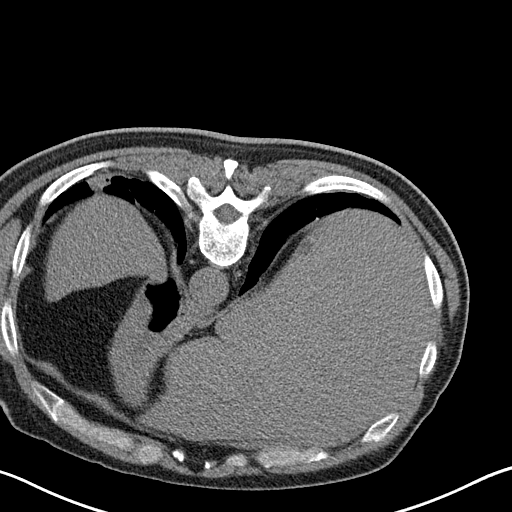
[im 73/77  lung]
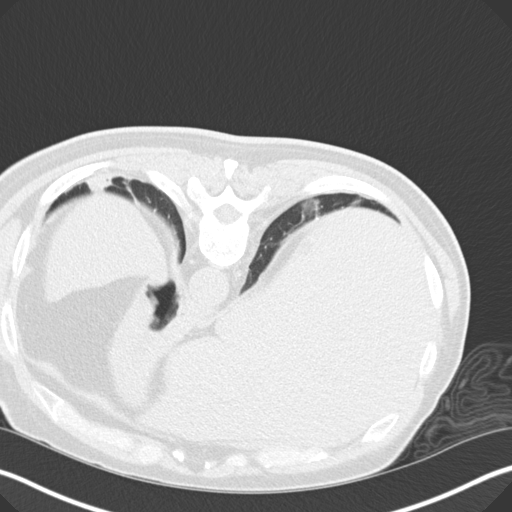

[14 of 32 positions shown; findings below may reference images not displayed]

ANESTHESIA/SEDATION:
Fentanyl 50 mcg IV; Versed 1.0 mg IV

Moderate Sedation Time:  10 minutes.

The patient was continuously monitored during the procedure by the
interventional radiology nurse under my direct supervision.

MEDICATIONS:
No additional medications.

COMPLICATIONS:
None immediate.

PROCEDURE:
Informed written consent was obtained from the patient after a
thorough discussion of the procedural risks, benefits and
alternatives. All questions were addressed. Maximal Sterile Barrier
Technique was utilized including caps, mask, sterile gowns, sterile
gloves, sterile drape, hand hygiene and skin antiseptic. A timeout
was performed prior to the initiation of the procedure.

CT was performed in a prone position through the lumbar region. A 5
Mhingana Abaseb centesis needle sheath catheter was advanced under CT
guidance into the medial aspect of the left psoas muscle adjacent to
the lumbar spine at the L3-4 level. Aspiration was performed. The
centesis needle was repositioned twice during aspiration. Fluid
sample was sent for culture analysis.
FINDINGS: CT demonstrates destructive process centered at the L3-4 level
affecting primarily the left side of the disc space and extending
into the adjacent left psoas muscle and posterior paraspinous
musculature. There is edema in the psoas muscle with relative
enlargement of the left psoas compared to the right. Aspiration at
the level of central edema within the left medial psoas muscle
yielded a total of 4 mL of bloody fluid. This was sent for culture
analysis. No discrete drainable abscess was identified to warrant
any drain placement at this time.
IMPRESSION: CT-guided paraspinous aspiration at the L3-4 level within an
edematous portion of the medial left ileo-psoas muscle abutting the
L3-4 disc space. This yielded approximately 4 mL of bloody fluid
which was sent for culture analysis.

## 2020-07-29 ENCOUNTER — Ambulatory Visit (INDEPENDENT_AMBULATORY_CARE_PROVIDER_SITE_OTHER): Payer: Medicare PPO

## 2020-07-29 ENCOUNTER — Ambulatory Visit: Payer: Medicare PPO

## 2020-07-29 ENCOUNTER — Ambulatory Visit: Payer: BC Managed Care – PPO | Admitting: Podiatry

## 2020-07-29 ENCOUNTER — Other Ambulatory Visit: Payer: Self-pay

## 2020-07-29 DIAGNOSIS — M79671 Pain in right foot: Secondary | ICD-10-CM | POA: Diagnosis not present

## 2020-07-29 NOTE — Progress Notes (Signed)
Subjective:   Patient ID: Luis Mccann Section, male   DOB: 65 y.o.   MRN: [id number]   HPI Patient presents stating that he had digital surgery with tenotomy's of his second third fourth toes right foot and it is now regrown and is developing distal deformity.  States that the second third and fourth distal toes become quite sore and make it hard for him to walk and they feel uncomfortable    ROS      Objective:  Physical Exam  Neurovascular status intact with patient found to have distal deformity second third fourth digits right with moderate flexible deformity of the MPJ with toes that do come down to a normal position at the MPJ when weightbearing.  The deformity strictly appears to be distal with frontal plane deformity also of the second toe     Assessment:  Appears to be distal rigid deformity with arthritic changes with proximal that is flexible and did not respond to tenotomy but is not truly tender     Plan:  H&P and tried to educate him on deformity and my thoughts.  Its difficulty given complete determination has had previous tenotomy's but I do think distal arthroplasties with rotational component to the fourth would be his best chance for long-term.  Patient is interested in this and I gave him consent form to read and discuss and unfortunately I did have an emergency in another room before I could get into his room he left and did not sign consent form.  We will contact him to get him back into the office as there was an unforeseen emergency  X-rays indicate there is significant distal deformity lesser digits right over left with minimal proximal form

## 2020-09-30 ENCOUNTER — Telehealth: Payer: Self-pay | Admitting: Cardiovascular Disease

## 2020-09-30 NOTE — Telephone Encounter (Signed)
    Pt would like to ask Dr. Salena Saner if he has any recommendation for dietician to help him lose weight. He said it's not something urgent and he is currently doing great with his heart, he just need to see a professional to help him lose weight

## 2020-10-01 NOTE — Telephone Encounter (Signed)
[facility]  

## 2020-10-02 NOTE — Telephone Encounter (Signed)
Patient has been made aware.

## 2020-10-02 NOTE — Telephone Encounter (Signed)
Left a message for the patient to call back.  He may try the [facility] office at [phone]

## 2020-10-06 ENCOUNTER — Ambulatory Visit (INDEPENDENT_AMBULATORY_CARE_PROVIDER_SITE_OTHER): Payer: Medicare PPO | Admitting: Family Medicine

## 2020-10-06 ENCOUNTER — Other Ambulatory Visit: Payer: Self-pay

## 2020-10-06 ENCOUNTER — Encounter (INDEPENDENT_AMBULATORY_CARE_PROVIDER_SITE_OTHER): Payer: Self-pay | Admitting: Family Medicine

## 2020-10-06 VITALS — BP 94/59 | HR 53 | Temp 98.0°F | Ht 67.0 in | Wt 203.0 lb

## 2020-10-06 DIAGNOSIS — Z1331 Encounter for screening for depression: Secondary | ICD-10-CM | POA: Diagnosis not present

## 2020-10-06 DIAGNOSIS — I480 Paroxysmal atrial fibrillation: Secondary | ICD-10-CM

## 2020-10-06 DIAGNOSIS — R5383 Other fatigue: Secondary | ICD-10-CM | POA: Diagnosis not present

## 2020-10-06 DIAGNOSIS — R0602 Shortness of breath: Secondary | ICD-10-CM | POA: Diagnosis not present

## 2020-10-06 DIAGNOSIS — I10 Essential (primary) hypertension: Secondary | ICD-10-CM

## 2020-10-06 DIAGNOSIS — G4733 Obstructive sleep apnea (adult) (pediatric): Secondary | ICD-10-CM

## 2020-10-06 DIAGNOSIS — Z9989 Dependence on other enabling machines and devices: Secondary | ICD-10-CM

## 2020-10-06 DIAGNOSIS — Z9189 Other specified personal risk factors, not elsewhere classified: Secondary | ICD-10-CM | POA: Insufficient documentation

## 2020-10-06 DIAGNOSIS — Z0289 Encounter for other administrative examinations: Secondary | ICD-10-CM

## 2020-10-06 DIAGNOSIS — E65 Localized adiposity: Secondary | ICD-10-CM

## 2020-10-06 DIAGNOSIS — E669 Obesity, unspecified: Secondary | ICD-10-CM

## 2020-10-06 DIAGNOSIS — Z6831 Body mass index (BMI) 31.0-31.9, adult: Secondary | ICD-10-CM | POA: Diagnosis not present

## 2020-10-07 LAB — COMPREHENSIVE METABOLIC PANEL
ALT: 24 IU/L (ref 0–44)
AST: 25 IU/L (ref 0–40)
Albumin/Globulin Ratio: 2.4 — ABNORMAL HIGH (ref 1.2–2.2)
Albumin: 4.8 g/dL (ref 3.8–4.8)
Alkaline Phosphatase: 39 IU/L — ABNORMAL LOW (ref 44–121)
BUN/Creatinine Ratio: 22 (ref 10–24)
BUN: 28 mg/dL — ABNORMAL HIGH (ref 8–27)
Bilirubin Total: 0.7 mg/dL (ref 0.0–1.2)
CO2: 24 mmol/L (ref 20–29)
Calcium: 10.5 mg/dL — ABNORMAL HIGH (ref 8.6–10.2)
Chloride: 102 mmol/L (ref 96–106)
Creatinine, Ser: 1.26 mg/dL (ref 0.76–1.27)
Globulin, Total: 2 g/dL (ref 1.5–4.5)
Glucose: 93 mg/dL (ref 65–99)
Potassium: 4.7 mmol/L (ref 3.5–5.2)
Sodium: 140 mmol/L (ref 134–144)
Total Protein: 6.8 g/dL (ref 6.0–8.5)
eGFR: 63 mL/min/{1.73_m2} (ref >59)

## 2020-10-07 LAB — CBC WITH DIFFERENTIAL/PLATELET
Basophils Absolute: 0 10*3/uL (ref 0.0–0.2)
Basos: 0 %
EOS (ABSOLUTE): 0.1 10*3/uL (ref 0.0–0.4)
Eos: 3 %
Hematocrit: 42.2 % (ref 37.5–51.0)
Hemoglobin: 14.4 g/dL (ref 13.0–17.7)
Immature Grans (Abs): 0 10*3/uL (ref 0.0–0.1)
Immature Granulocytes: 0 %
Lymphocytes Absolute: 0.9 10*3/uL (ref 0.7–3.1)
Lymphs: 21 %
MCH: 30.4 pg (ref 26.6–33.0)
MCHC: 34.1 g/dL (ref 31.5–35.7)
MCV: 89 fL (ref 79–97)
Monocytes Absolute: 0.4 10*3/uL (ref 0.1–0.9)
Monocytes: 10 %
Neutrophils Absolute: 2.9 10*3/uL (ref 1.4–7.0)
Neutrophils: 66 %
Platelets: 205 10*3/uL (ref 150–450)
RBC: 4.73 x10E6/uL (ref 4.14–5.80)
RDW: 12.3 % (ref 11.6–15.4)
WBC: 4.3 10*3/uL (ref 3.4–10.8)

## 2020-10-07 LAB — VITAMIN D 25 HYDROXY (VIT D DEFICIENCY, FRACTURES): Vit D, 25-Hydroxy: 56.4 ng/mL (ref 30.0–100.0)

## 2020-10-07 LAB — HEMOGLOBIN A1C
Est. average glucose Bld gHb Est-mCnc: 114 mg/dL
Hgb A1c MFr Bld: 5.6 % (ref 4.8–5.6)

## 2020-10-07 LAB — VITAMIN B12: Vitamin B-12: 651 pg/mL (ref 232–1245)

## 2020-10-07 LAB — LIPID PANEL
Chol/HDL Ratio: 1.9 ratio (ref 0.0–5.0)
Cholesterol, Total: 138 mg/dL (ref 100–199)
HDL: 74 mg/dL (ref >39)
LDL Chol Calc (NIH): 51 mg/dL (ref 0–99)
Triglycerides: 66 mg/dL (ref 0–149)
VLDL Cholesterol Cal: 13 mg/dL (ref 5–40)

## 2020-10-07 LAB — INSULIN, RANDOM: INSULIN: 16.2 u[IU]/mL (ref 2.6–24.9)

## 2020-10-07 LAB — TSH: TSH: 1.54 u[IU]/mL (ref 0.450–4.500)

## 2020-10-07 LAB — T4, FREE: Free T4: 1.22 ng/dL (ref 0.82–1.77)

## 2020-10-07 LAB — FOLATE: Folate: >20 ng/mL (ref >3.0)

## 2020-10-07 LAB — T3: T3, Total: 104 ng/dL (ref 71–180)

## 2020-10-08 ENCOUNTER — Telehealth: Payer: Self-pay | Admitting: Cardiovascular Disease

## 2020-10-08 NOTE — Telephone Encounter (Signed)
Pt c/o of Chest Pain: STAT if CP now or developed within 24 hours  1. Are you having CP right now? Pt says he can hardly describe how it feels- more like a twitting in his chest  2. Are you experiencing any other symptoms (ex. SOB, nausea, vomiting, sweating)?   3. How long have you been experiencing CP? A couple of weeks  4. Is your CP continuous or coming and going? Very intermittent  5. Have you taken Nitroglycerin?  Do not have any to take- pt would like to be seen, concerned about this feeling ?

## 2020-10-08 NOTE — Telephone Encounter (Signed)
Attempted to call patient -- unable to reach -- left message to call back   Per Susa Raring, OK to scheduled for 10/09/20 @ 8am

## 2020-10-09 ENCOUNTER — Other Ambulatory Visit: Payer: Self-pay

## 2020-10-09 DIAGNOSIS — R002 Palpitations: Secondary | ICD-10-CM

## 2020-10-09 DIAGNOSIS — I480 Paroxysmal atrial fibrillation: Secondary | ICD-10-CM

## 2020-10-09 NOTE — Telephone Encounter (Signed)
Ordered cardiac event monitor.  Called patient back to advise of message from MD.  Advised that monitor was ordered and they would be in contact with him to get it sent out.  Patient verbalized understanding.

## 2020-10-09 NOTE — Telephone Encounter (Signed)
Patient is returning call.  °

## 2020-10-09 NOTE — Telephone Encounter (Signed)
Called patient back.  He states that he just gets these "twinges" in his chest area, they are not painful- but he does notice them. He states that they come at different times doing different activities. He states his blood pressure and HR are good *did not give readings. But he states that he feels fine other than those twinges that he has. I advised that Dr.C was out of office the next two weeks. I did not see any APP appointments. Not sure what you all thought about doing, but he states he is going out of town next week and will be back on the 18th. I advised I would send a message and get back with him.   Patient verbalized understanding.

## 2020-10-09 NOTE — Telephone Encounter (Signed)
Left message for pt to call.

## 2020-10-14 NOTE — Progress Notes (Signed)
Chief Complaint:   OBESITY Luis Mccann (MR# [id number]) is a 65 y.o. male who presents for evaluation and treatment of obesity and related comorbidities. Current BMI is Body mass index is 31.79 kg/m. Luis Mccann has been struggling with his weight for many years and has been unsuccessful in either losing weight, maintaining weight loss, or reaching his healthy weight goal.  Luis Mccann is currently in the action stage of change and ready to dedicate time achieving and maintaining a healthier weight. Luis Mccann is interested in becoming our patient and working on intensive lifestyle modifications including (but not limited to) diet and exercise for weight loss.  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  Lives with his wife, Luis Mccann.  Craves carbs such as chips, nuts, crackers.  Drinks coffee with creamer and sugar, juice, milk, and occasionally beer.  Worst habit is carbs at night and larger portions than needed.  He is going on vacation to the beach for 1 week tomorrow.  He plans to really start after that.  Luis Mccann's habits were reviewed today and are as follows: His family eats meals together, he thinks his family will eat healthier with him, he struggles with family and or coworkers weight loss sabotage, his desired weight loss is 42 pounds, he started gaining weight gradually in 1996, his heaviest weight ever was 225 pounds, he craves carbohydrates, he snacks frequently in the evenings, he is frequently drinking liquids with calories, he frequently makes poor food choices, and he frequently eats larger portions than normal.  Depression Screen [name]'s Food and Mood (modified PHQ-9) score was 9.  Depression screen PHQ 2/9 10/06/2020  Decreased Interest 1  Down, Depressed, Hopeless 1  PHQ - 2 Score 2  Altered sleeping 3  Tired, decreased energy 2  Change in appetite 0  Feeling bad or failure about yourself  1  Trouble concentrating 1  Moving slowly or fidgety/restless 0  Suicidal thoughts 0  PHQ-9 Score 9  Difficult  doing work/chores Not difficult at all   Assessment/Plan:   Orders Placed This Encounter  Procedures   Vitamin B12   CBC with Differential/Platelet   Comprehensive metabolic panel   Folate   Hemoglobin A1c   Insulin, random   Lipid panel   T3   T4, free   TSH   VITAMIN D 25 Hydroxy (Vit-D Deficiency, Fractures)   EKG 12-Lead   Medications Discontinued During This Encounter  Medication Reason   tamsulosin (FLOMAX) 0.4 MG CAPS capsule    1. Other fatigue [name] admits to daytime somnolence and reports waking up still tired. Patent has a history of symptoms of daytime fatigue and morning fatigue. Terrace generally gets 6 or 7 hours of sleep per night, and states that he has generally restful sleep. Snoring is not present. Apneic episodes are not present. Epworth Sleepiness Score is 6.  [name] does feel that his weight is causing his energy to be lower than it should be. Fatigue may be related to obesity, depression or many other causes. Labs will be ordered, and in the meanwhile, [name] will focus on self care including making healthy food choices, increasing physical activity and focusing on stress reduction.  - EKG 12-Lead - Vitamin B12 - CBC with Differential/Platelet - Comprehensive metabolic panel - Folate - Hemoglobin A1c - Insulin, random - Lipid panel - T3 - T4, free - TSH - VITAMIN D 25 Hydroxy (Vit-D Deficiency, Fractures)  2. SOBOE (shortness of breath on exertion) Luis Mccann notes increasing shortness of breath with exercising and seems  to be worsening over time with weight gain. He notes getting out of breath sooner with activity than he used to. This has gotten worse recently. Luis Mccann denies shortness of breath at rest or orthopnea.  Luis Mccann does feel that he gets out of breath more easily that he used to when he exercises. Luis Mccann's shortness of breath appears to be obesity related and exercise induced. He has agreed to work on weight loss and gradually increase exercise to  treat his exercise induced shortness of breath. Will continue to monitor closely.  3. Essential hypertension At goal. Medications: Norvasc 10 mg daily, spironolactone 25 mg daily, olmesartan-HCTZ 40-25 mg daily.   Plan:  At goal. Avoid buying foods that are: processed, frozen, or prepackaged to avoid excess salt. We will watch for signs of hypotension as he continues lifestyle modifications.  BP Readings from Last 3 Encounters:  10/06/20 (!) 94/59  01/27/20 117/80  10/08/19 130/70   Lab Results  Component Value Date   CREATININE 1.26 10/06/2020   - Vitamin B12 - CBC with Differential/Platelet - Comprehensive metabolic panel - Folate - Hemoglobin A1c - Insulin, random - Lipid panel - T3 - T4, free - TSH - VITAMIN D 25 Hydroxy (Vit-D Deficiency, Fractures)  4. OSA on CPAP Uses a CPAP at bedtime.  Some problems with mask lately.  Plan:  he will contact mask/equipment company regarding new mask he recently got and inquire about a more comfortable fit for him.  OSA is a cause of systemic hypertension and is associated with an increased incidence of stroke, heart failure, atrial fibrillation, and coronary heart disease. Severe OSA increases all-cause mortality and  cardiovascular mortality.   Goal: Treatment of OSA via CPAP compliance and weight loss. Plasma ghrelin levels (appetite or "hunger hormone") are significantly higher in OSA patients than in BMI-matched controls, but decrease to levels similar to those of obese patients without OSA after CPAP treatment.  Weight loss improves OSA by several mechanisms, including reduction in fatty tissue in the throat (i.e. parapharyngeal fat) and the tongue. Loss of abdominal fat increases mediastinal traction on the upper airway making it less likely to collapse during sleep. Studies have also shown that compliance with CPAP treatment improves leptin (hunger inhibitory hormone) imbalance.  - Vitamin B12 - CBC with  Differential/Platelet - Comprehensive metabolic panel - Folate - Hemoglobin A1c - Insulin, random - Lipid panel - T3 - T4, free - TSH - VITAMIN D 25 Hydroxy (Vit-D Deficiency, Fractures)  5. Paroxysmal atrial fibrillation [facility]) Sees Cardiology, Dr. Royann Shivers, with [facility].  Stable.  Asymptomatic.  Plan:  Stable.  Rate controlled and bradycardic.  Asymptomatic today.  Not an issue for many years, per patient.  Still sees Dr. Royann Shivers every 6-12 months, who recommended our program to him.  6. Truncal obesity With elevated visceral fat as well.  iC results discussed with patient and concerns I had with these findings.  Plan:  Start prudent nutritional plan, continue same exercise for now.  Start weight loss.  7. Depression screening Leona was screened for depression as part of his new patient workup today.  PHQ-9 is 9.  Firman had a positive depression screening. Depression is commonly associated with obesity and often results in emotional eating behaviors. We will monitor this closely and work on CBT to help improve the non-hunger eating patterns. Referral to Psychology may be required if no improvement is seen as he continues in our clinic.  8. Class 1 obesity with serious comorbidity and body mass index (BMI)  of 31.0 to 31.9 in adult, unspecified obesity type  Jyles is currently in the action stage of change and his goal is to continue with weight loss efforts. I recommend Cristan begin the structured treatment plan as follows:  He has agreed to the Category 2 Plan.  Exercise goals:  As is.    Behavioral modification strategies: decreasing liquid calories, meal planning and cooking strategies, travel eating strategies, avoiding temptations, and planning for success.  He was informed of the importance of frequent follow-up visits to maximize his success with intensive lifestyle modifications for his multiple health conditions. He was informed we would discuss his lab results at his next  visit unless there is a critical issue that needs to be addressed sooner. Oshay agreed to keep his next visit at the agreed upon time to discuss these results.  Objective:   Blood pressure (!) 94/59, pulse (!) 53, temperature 98 F (36.7 C), height 5\' 7"  (1.702 m), weight 203 lb (92.1 kg), SpO2 97 %. Body mass index is 31.79 kg/m.  EKG: Normal sinus rhythm, rate 54 bpm.  Indirect Calorimeter completed today shows a VO2 of 250 and a REE of 1740.  His calculated basal metabolic rate is thus his basal metabolic rate is worse than expected.  General: Cooperative, alert, well developed, in no acute distress. HEENT: Conjunctivae and lids unremarkable. Cardiovascular: Regular rhythm.  Lungs: Normal work of breathing. Neurologic: No focal deficits.   Lab Results  Component Value Date   CREATININE 1.26 10/06/2020   BUN 28 (H) 10/06/2020   NA 140 10/06/2020   K 4.7 10/06/2020   CL 102 10/06/2020   CO2 24 10/06/2020   Lab Results  Component Value Date   ALT 24 10/06/2020   AST 25 10/06/2020   ALKPHOS 39 (L) 10/06/2020   BILITOT 0.7 10/06/2020   Lab Results  Component Value Date   HGBA1C 5.6 10/06/2020   Lab Results  Component Value Date   INSULIN 16.2 10/06/2020   Lab Results  Component Value Date   TSH 1.540 10/06/2020   Lab Results  Component Value Date   CHOL 138 10/06/2020   HDL 74 10/06/2020   LDLCALC 51 10/06/2020   TRIG 66 10/06/2020   CHOLHDL 1.9 10/06/2020   Lab Results  Component Value Date   WBC 4.3 10/06/2020   HGB 14.4 10/06/2020   HCT 42.2 10/06/2020   MCV 89 10/06/2020   PLT 205 10/06/2020   Obesity Behavioral Intervention:   Approximately 15 minutes were spent on the discussion below.  ASK: We discussed the diagnosis of obesity with 12/06/2020 today and Luis Mccann agreed to give Luis Mccann permission to discuss obesity behavioral modification therapy today.  ASSESS: Luis Mccann has the diagnosis of obesity and his BMI today is 31.9. Luis Mccann is in the action stage  of change.   ADVISE: Luis Mccann was educated on the multiple health risks of obesity as well as the benefit of weight loss to improve his health. He was advised of the need for long term treatment and the importance of lifestyle modifications to improve his current health and to decrease his risk of future health problems.  AGREE: Multiple dietary modification options and treatment options were discussed and Luis Mccann agreed to follow the recommendations documented in the above note.  ARRANGE: Luis Mccann was educated on the importance of frequent visits to treat obesity as outlined per CMS and USPSTF guidelines and agreed to schedule his next follow up appointment today.  Attestation Statements:   This is the  patient's first visit at [facility]. The patient's NEW PATIENT PACKET was reviewed at length. Included in the packet: current and past health history, medications, allergies, ROS, gynecologic history (women only), surgical history, family history, social history, weight history, weight loss surgery history (for those that have had weight loss surgery), nutritional evaluation, mood and food questionnaire, PHQ9, Epworth questionnaire, sleep habits questionnaire, patient life and health improvement goals questionnaire. These will all be scanned into the patient's chart under media.   During the visit, I independently reviewed the patient's EKG, bioimpedance scale results, and indirect calorimeter results. I used this information to tailor a meal plan for the patient that will help him to lose weight and will improve his obesity-related conditions going forward. I performed a medically necessary appropriate examination and/or evaluation. I discussed the assessment and treatment plan with the patient. The patient was provided an opportunity to ask questions and all were answered. The patient agreed with the plan and demonstrated an understanding of the instructions. Labs were ordered at this visit  and will be reviewed at the next visit unless more critical results need to be addressed immediately. Clinical information was updated and documented in the EMR.   I, Insurance claims handlerAmber Agner, CMA, am acting as Energy managertranscriptionist for [employer] & McLennanDeborah Dwyane Dupree, DO.  I have reviewed the above documentation for accuracy and completeness, and I agree with the above. Luis Mccann, D.O.  The 21st Century Cures Act was signed into law in 2016 which includes the topic of electronic health records.  This provides immediate access to information in MyChart.  This includes consultation notes, operative notes, office notes, lab results and pathology reports.  If you have any questions about what you read please let us know at your next visit so we can discuss your concerns and take corrective action if need be.  We are right here with you.

## 2020-10-19 ENCOUNTER — Ambulatory Visit (INDEPENDENT_AMBULATORY_CARE_PROVIDER_SITE_OTHER): Payer: Medicare PPO

## 2020-10-19 DIAGNOSIS — R002 Palpitations: Secondary | ICD-10-CM

## 2020-10-19 DIAGNOSIS — I480 Paroxysmal atrial fibrillation: Secondary | ICD-10-CM

## 2020-10-20 ENCOUNTER — Telehealth: Payer: Self-pay | Admitting: Cardiovascular Disease

## 2020-10-20 NOTE — Telephone Encounter (Signed)
Patient called in, wanting to know if he is okay to play his guitar with his monitor. I advised I did not know of any issues with this, we should not have any issues with the data.  Patient verbalized understanding.  Thankful for call back.

## 2020-10-20 NOTE — Telephone Encounter (Signed)
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 Please call patient.

## 2020-10-20 NOTE — Telephone Encounter (Signed)
Left message to call back  

## 2020-10-20 NOTE — Telephone Encounter (Signed)
Called patient, LVM advising that it was okay to get into the pool, but recommended to dry off after he gets out.  Patient advised to call back with any other questions.

## 2020-10-20 NOTE — Telephone Encounter (Signed)
Patient is following up, with another question. He states he is considering going to his neighborhood pool to cool off. The instructions say he can be 3 ft under water and he would like to confirm this is accurate. He states he doesn't plan to do heavy swimming or diving. Please advise. He requesting advisement in a voice message if he is unavailable when his call is returned.

## 2020-10-27 ENCOUNTER — Other Ambulatory Visit: Payer: Self-pay

## 2020-10-27 ENCOUNTER — Ambulatory Visit (INDEPENDENT_AMBULATORY_CARE_PROVIDER_SITE_OTHER): Payer: Medicare PPO | Admitting: Family Medicine

## 2020-10-27 ENCOUNTER — Encounter (INDEPENDENT_AMBULATORY_CARE_PROVIDER_SITE_OTHER): Payer: Self-pay | Admitting: Family Medicine

## 2020-10-27 VITALS — BP 113/67 | HR 59 | Temp 98.0°F | Ht 67.0 in | Wt 202.0 lb

## 2020-10-27 DIAGNOSIS — E559 Vitamin D deficiency, unspecified: Secondary | ICD-10-CM

## 2020-10-27 DIAGNOSIS — E860 Dehydration: Secondary | ICD-10-CM

## 2020-10-27 DIAGNOSIS — Z6831 Body mass index (BMI) 31.0-31.9, adult: Secondary | ICD-10-CM

## 2020-10-27 DIAGNOSIS — I10 Essential (primary) hypertension: Secondary | ICD-10-CM | POA: Diagnosis not present

## 2020-10-27 DIAGNOSIS — D509 Iron deficiency anemia, unspecified: Secondary | ICD-10-CM

## 2020-10-27 DIAGNOSIS — E8881 Metabolic syndrome: Secondary | ICD-10-CM

## 2020-10-27 DIAGNOSIS — E88819 Insulin resistance, unspecified: Secondary | ICD-10-CM

## 2020-10-27 DIAGNOSIS — E669 Obesity, unspecified: Secondary | ICD-10-CM

## 2020-10-30 ENCOUNTER — Encounter: Payer: Self-pay | Admitting: *Deleted

## 2020-11-05 NOTE — Progress Notes (Signed)
Chief Complaint:   OBESITY Luis Mccann is here to discuss his progress with his obesity treatment plan along with follow-up of his obesity related diagnoses.   Today's visit was #: 2 Starting weight: 203 lbs Starting date: 10/06/2020 Today's weight: 202 lbs Today's date: 10/27/2020 Weight change since last visit: 1 lb Total lbs lost to date: 1 lb Body mass index is 31.64 kg/m.  Total weight loss percentage to date: -0.49%  Interim History:  Luis Mccann is here today for his first follow-up office visit since starting the program with [place].  All blood work/ lab tests that were recently ordered by myself or an outside provider were reviewed with patient today per their request.   Extended time was spent counseling him on all new disease processes that were discovered or preexisting ones that are worsening.  he understands that many of these abnormalities will need to monitored regularly along with the current treatment plan of prudent dietary changes, in which we are making each and every office visit, to improve these health parameters.  Luis Mccann says that for the first week he did not follow the plan and for the second week he was at the beach and still lost 1 pound.  He is now walking 2 miles per day.  Eating mostly fruits and vegetables.  Having a little bit of sweets here and there, very little protein.  Current Meal Plan: the Category 2 Plan for 30% of the time.  Current Exercise Plan: Walking for 2 minutes 7 times per week.  Assessment/Plan:   1. Essential hypertension At goal. Medications: Norvasc 10 mg daily, Benicar HCT 40-25 mg daily, Aldactone 25 mg daily.   Plan:  Discussed labs with patient today.  Avoid buying foods that are: processed, frozen, or prepackaged to avoid excess salt. We will watch for signs of hypotension as he continues lifestyle modifications. We will continue to monitor closely alongside his PCP and/or Specialist.  Regular follow up with PCP and specialists was  also encouraged.   BP Readings from Last 3 Encounters:  10/27/20 113/67  10/06/20 (!) 94/59  01/27/20 117/80   Lab Results  Component Value Date   CREATININE 1.26 10/06/2020   2. Insulin resistance Not at goal. Goal is HgbA1c < 5.7, fasting insulin closer to 5.  Medication: None.  Luis Mccann is eating mostly fruits and vegetables.  He is not eating the meat/proteins much at all.  Fasting insulin is 16.2.  Plan:  New.  Discussed labs with patient today.  Handouts on IR given to him today and extensive counseling done on disease process, prevention of diabetes, and dietary changes to correct it.  Will consider medications in the future as needed.  He will continue to focus on protein-rich, low simple carbohydrate foods. We reviewed the importance of hydration, regular exercise for stress reduction, and restorative sleep.   Lab Results  Component Value Date   HGBA1C 5.6 10/06/2020   Lab Results  Component Value Date   INSULIN 16.2 10/06/2020   3. Mild dehydration Elevated BUN and sodium at upper limits of normal.  He drinks only 3-4 bottles of water at most per day (16.9 ounces).  Plan:  New.  Discussed labs with patient today.  - We discussed the signs and symptoms of dehydration; some of which may include muscle cramping, constipation or even orthostatic symptoms.    - he was encouraged to adequately hydrate and monitor fluid status to avoid dehydration as well as weight loss plateaus.   -  Unless pre-existing renal or cardiopulmonary conditions exist which patient was told to limit their fluid intake by another provider, I recommended roughly one half of their weight in pounds, to be the approximate ounces of non-caloric, non-caffeinated beverages they should drink per day; including more if they are engaging in exercise.  4. Mild Hypercalcemia Luis Mccann is asymptomatic, without concerns.  Calcium less than 12, but above upper limit of normal.    Plan:  Advised for patient to obtain  medication records/labs from [facility] PCP and bring in next office visit to abstract.  Counseling done.  Monitor closely with repeat labs in about 3 months with next blood work.  5. Iron deficiency anemia, unspecified iron deficiency anemia type Denies intolerance or GI issues to supplements.  Taking ferrous sulfate 28 mg on Monday, Wednesday, Friday.  Plan:  Discussed labs with patient today.  CBC stable.  Continue OTC supplement.   Nutrition: Iron-rich foods include dark leafy greens, red and white meats, eggs, seafood, and beans.  Certain foods and drinks prevent your body from absorbing iron properly. Avoid eating these foods in the same meal as iron-rich foods or with iron supplements. These foods include: coffee, black tea, and red wine; milk, dairy products, and foods that are high in calcium; beans and soybeans; whole grains. Constipation can be a side effect of iron supplementation. Increased water and fiber intake are helpful. Water goal: > 2 liters/day. Fiber goal: > 25 grams/day.  CBC Latest Ref Rng & Units 10/06/2020 04/11/2018 01/20/2018  WBC 3.4 - 10.8 x10E3/uL 4.3 4.7 8.5  Hemoglobin 13.0 - 17.7 g/dL 05.6 97.9 48.0  Hematocrit 37.5 - 51.0 % 42.2 38.7 40.0  Platelets 150 - 450 x10E3/uL 205 246 307   Lab Results  Component Value Date   VITAMINB12 651 10/06/2020   6. Vitamin D deficiency At goal.  He is taking a daily multivitamin.  Plan:  Discussed labs with patient today.  Continue multivitamin.  Lab Results  Component Value Date   VD25OH 56.4 10/06/2020   7. Obesity with current BMI of 31.64  Course: Luis Mccann is currently in the action stage of change. As such, his goal is to continue with weight loss efforts.   Nutrition goals: He has agreed to the Category 2 Plan.   Exercise goals:  As is.  Behavioral modification strategies: increasing lean protein intake, decreasing simple carbohydrates, increasing water intake, and planning for success.  Luis Mccann has agreed to follow-up  with our clinic in 2-3 weeks. He was informed of the importance of frequent follow-up visits to maximize his success with intensive lifestyle modifications for his multiple health conditions.   Objective:   Blood pressure 113/67, pulse (!) 59, temperature 98 F (36.7 C), height 5\' 7"  (1.702 m), weight 202 lb (91.6 kg), SpO2 98 %. Body mass index is 31.64 kg/m.  General: Cooperative, alert, well developed, in no acute distress. HEENT: Conjunctivae and lids unremarkable. Cardiovascular: Regular rhythm.  Lungs: Normal work of breathing. Neurologic: No focal deficits.   Lab Results  Component Value Date   CREATININE 1.26 10/06/2020   BUN 28 (H) 10/06/2020   NA 140 10/06/2020   K 4.7 10/06/2020   CL 102 10/06/2020   CO2 24 10/06/2020   Lab Results  Component Value Date   ALT 24 10/06/2020   AST 25 10/06/2020   ALKPHOS 39 (L) 10/06/2020   BILITOT 0.7 10/06/2020   Lab Results  Component Value Date   HGBA1C 5.6 10/06/2020   Lab Results  Component Value  Date   INSULIN 16.2 10/06/2020   Lab Results  Component Value Date   TSH 1.540 10/06/2020   Lab Results  Component Value Date   CHOL 138 10/06/2020   HDL 74 10/06/2020   LDLCALC 51 10/06/2020   TRIG 66 10/06/2020   CHOLHDL 1.9 10/06/2020   Lab Results  Component Value Date   VD25OH 56.4 10/06/2020   Lab Results  Component Value Date   WBC 4.3 10/06/2020   HGB 14.4 10/06/2020   HCT 42.2 10/06/2020   MCV 89 10/06/2020   PLT 205 10/06/2020   Attestation Statements:   Reviewed by clinician on day of visit: allergies, medications, problem list, medical history, surgical history, family history, social history, and previous encounter notes.  Time spent on visit including pre-visit chart review and post-visit care and charting was 45 minutes.   I, Insurance claims handler, CMA, am acting as Energy manager for [employer], DO.  I have reviewed the above documentation for accuracy and completeness, and I agree with the  above. Carlye Grippe, D.O.  The 21st Century Cures Act was signed into law in 2016 which includes the topic of electronic health records.  This provides immediate access to information in MyChart.  This includes consultation notes, operative notes, office notes, lab results and pathology reports.  If you have any questions about what you read please let us know at your next visit so we can discuss your concerns and take corrective action if need be.  We are right here with you.

## 2020-11-10 ENCOUNTER — Encounter (INDEPENDENT_AMBULATORY_CARE_PROVIDER_SITE_OTHER): Payer: Self-pay | Admitting: Physician Assistant

## 2020-11-10 ENCOUNTER — Other Ambulatory Visit: Payer: Self-pay

## 2020-11-10 ENCOUNTER — Ambulatory Visit (INDEPENDENT_AMBULATORY_CARE_PROVIDER_SITE_OTHER): Payer: Medicare PPO | Admitting: Physician Assistant

## 2020-11-10 VITALS — BP 112/71 | HR 57 | Temp 97.6°F | Ht 67.0 in | Wt 199.0 lb

## 2020-11-10 DIAGNOSIS — E559 Vitamin D deficiency, unspecified: Secondary | ICD-10-CM | POA: Diagnosis not present

## 2020-11-10 DIAGNOSIS — E669 Obesity, unspecified: Secondary | ICD-10-CM | POA: Diagnosis not present

## 2020-11-10 DIAGNOSIS — Z6831 Body mass index (BMI) 31.0-31.9, adult: Secondary | ICD-10-CM

## 2020-11-17 NOTE — Progress Notes (Signed)
Chief Complaint:   OBESITY Luis Mccann is here to discuss his progress with his obesity treatment plan along with follow-up of his obesity related diagnoses. Kache is on the Category 2 Plan and states he is following his eating plan approximately 80-85% of the time. Yogi states he is walking 3-5 miles 7 times per week.   Today's visit was #: 3 Starting weight: 203 lbs Starting date: 10/06/2020 Today's weight: 199 lbs Today's date: 11/10/2020 Total lbs lost to date: 4 Total lbs lost since last in-office visit: 3  Interim History: Lake is journaling daily and he is averaging between [phone] calories. He finds himself hungry throughout the day at times. A detailed overview of MyFitness Pal with goal settings was done today.  Subjective:   1. Vitamin D deficiency Jawad's last Vit D level was at goal. He is not on supplementation.  Assessment/Plan:   1. Vitamin D deficiency Low Vitamin D level contributes to fatigue and are associated with obesity, breast, and colon cancer. Echo will continue with his meal plan, and we will continue to monitor his Vit D level. He will follow-up for routine testing of Vitamin D, at least 2-3 times per year to avoid over-replacement.  2. Obesity with current BMI of 31.16 Cailan is currently in the action stage of change. As such, his goal is to continue with weight loss efforts. He has agreed to keeping a food journal and adhering to recommended goals of 1400-1500 calories and 100 grams of protein daily.   Exercise goals: As is.  Behavioral modification strategies: planning for success and keeping a strict food journal.  Orazio has agreed to follow-up with our clinic in 2 weeks. He was informed of the importance of frequent follow-up visits to maximize his success with intensive lifestyle modifications for his multiple health conditions.   Objective:   Blood pressure 112/71, pulse (!) 57, temperature 97.6 F (36.4 C), height 5\' 7"  (1.702 m), weight 199  lb (90.3 kg), SpO2 98 %. Body mass index is 31.17 kg/m.  General: Cooperative, alert, well developed, in no acute distress. HEENT: Conjunctivae and lids unremarkable. Cardiovascular: Regular rhythm.  Lungs: Normal work of breathing. Neurologic: No focal deficits.   Lab Results  Component Value Date   CREATININE 1.26 10/06/2020   BUN 28 (H) 10/06/2020   NA 140 10/06/2020   K 4.7 10/06/2020   CL 102 10/06/2020   CO2 24 10/06/2020   Lab Results  Component Value Date   ALT 24 10/06/2020   AST 25 10/06/2020   ALKPHOS 39 (L) 10/06/2020   BILITOT 0.7 10/06/2020   Lab Results  Component Value Date   HGBA1C 5.6 10/06/2020   Lab Results  Component Value Date   INSULIN 16.2 10/06/2020   Lab Results  Component Value Date   TSH 1.540 10/06/2020   Lab Results  Component Value Date   CHOL 138 10/06/2020   HDL 74 10/06/2020   LDLCALC 51 10/06/2020   TRIG 66 10/06/2020   CHOLHDL 1.9 10/06/2020   Lab Results  Component Value Date   VD25OH 56.4 10/06/2020   Lab Results  Component Value Date   WBC 4.3 10/06/2020   HGB 14.4 10/06/2020   HCT 42.2 10/06/2020   MCV 89 10/06/2020   PLT 205 10/06/2020   No results found for: IRON, TIBC, FERRITIN  Obesity Behavioral Intervention:   Approximately 15 minutes were spent on the discussion below.  ASK: We discussed the diagnosis of obesity with 12/06/2020 today and Onalee Hua  agreed to give [place] permission to discuss obesity behavioral modification therapy today.  ASSESS: Elva has the diagnosis of obesity and his BMI today is 31.16. Zyren is in the action stage of change.   ADVISE: Candice was educated on the multiple health risks of obesity as well as the benefit of weight loss to improve his health. He was advised of the need for long term treatment and the importance of lifestyle modifications to improve his current health and to decrease his risk of future health problems.  AGREE: Multiple dietary modification options and treatment  options were discussed and Quantae agreed to follow the recommendations documented in the above note.  ARRANGE: Javares was educated on the importance of frequent visits to treat obesity as outlined per CMS and USPSTF guidelines and agreed to schedule his next follow up appointment today.  Attestation Statements:   Reviewed by clinician on day of visit: allergies, medications, problem list, medical history, surgical history, family history, social history, and previous encounter notes.   Trude Mcburney, am acting as transcriptionist for [employer], PA-C.  I have reviewed the above documentation for accuracy and completeness, and I agree with the above. Alois Cliche, PA-C

## 2020-11-25 ENCOUNTER — Ambulatory Visit (INDEPENDENT_AMBULATORY_CARE_PROVIDER_SITE_OTHER): Payer: Medicare PPO | Admitting: Family Medicine

## 2020-11-25 ENCOUNTER — Encounter (INDEPENDENT_AMBULATORY_CARE_PROVIDER_SITE_OTHER): Payer: Self-pay | Admitting: Family Medicine

## 2020-11-25 ENCOUNTER — Other Ambulatory Visit: Payer: Self-pay

## 2020-11-25 VITALS — BP 93/59 | HR 69 | Temp 98.7°F | Ht 67.0 in | Wt 192.0 lb

## 2020-11-25 DIAGNOSIS — Z6831 Body mass index (BMI) 31.0-31.9, adult: Secondary | ICD-10-CM

## 2020-11-25 DIAGNOSIS — E669 Obesity, unspecified: Secondary | ICD-10-CM | POA: Diagnosis not present

## 2020-11-25 DIAGNOSIS — I10 Essential (primary) hypertension: Secondary | ICD-10-CM

## 2020-12-02 NOTE — Progress Notes (Signed)
Chief Complaint:   OBESITY Ilhan is here to discuss his progress with his obesity treatment plan along with follow-up of his obesity related diagnoses. Naszir is on keeping a food journal and adhering to recommended goals of 1400-1500 calories and 100 grams of protein daily and states he is following his eating plan approximately 80% of the time. Raykwon states he is walking for 120 minutes 7 times per week.  Today's visit was #: 4 Starting weight: 203 lbs Starting date: 10/06/2020 Today's weight: 192 lbs Today's date: 11/25/2020 Total lbs lost to date: 11 Total lbs lost since last in-office visit: 7  Interim History: Trust got in 1300-1400 calories per day on an average, and he is getting 100 grams of protein. He did not bring in his food log for my review or his app/phone. He denies hunger or cravings.  Subjective:   1. Essential hypertension Karel's blood pressure is low normal, but stable and similar to previous recent readings. He is asymptomatic, and he has no new concerns.  Assessment/Plan:  No orders of the defined types were placed in this encounter.   There are no discontinued medications.   No orders of the defined types were placed in this encounter.    1. Essential hypertension Haroun is to recheck his blood pressure at home 2-3 times per week and keep a long. He will continue his blood pressure medications per Cardiology. We will continue to monitor every 2-3 weeks. If he has any symptoms or concerns, he will contact his Cardiologist for further recommendations.  2. Obesity with current BMI Of 30.1 Elzia is currently in the action stage of change. As such, his goal is to continue with weight loss efforts. He has agreed to keeping a food journal and adhering to recommended goals of 1400-1500 calories and 100+ grams of protein daily.   Exercise goals: As is.  Behavioral modification strategies: increasing lean protein intake, decreasing simple carbohydrates, meal  planning and cooking strategies, and planning for success.  Husayn has agreed to follow-up with our clinic in 3 weeks. He was informed of the importance of frequent follow-up visits to maximize his success with intensive lifestyle modifications for his multiple health conditions.   Objective:   Blood pressure (!) 93/59, pulse 69, temperature 98.7 F (37.1 C), height 5\' 7"  (1.702 m), weight 192 lb (87.1 kg), SpO2 98 %. Body mass index is 30.07 kg/m.  General: Cooperative, alert, well developed, in no acute distress. HEENT: Conjunctivae and lids unremarkable. Cardiovascular: Regular rhythm.  Lungs: Normal work of breathing. Neurologic: No focal deficits.   Lab Results  Component Value Date   CREATININE 1.26 10/06/2020   BUN 28 (H) 10/06/2020   NA 140 10/06/2020   K 4.7 10/06/2020   CL 102 10/06/2020   CO2 24 10/06/2020   Lab Results  Component Value Date   ALT 24 10/06/2020   AST 25 10/06/2020   ALKPHOS 39 (L) 10/06/2020   BILITOT 0.7 10/06/2020   Lab Results  Component Value Date   HGBA1C 5.6 10/06/2020   Lab Results  Component Value Date   INSULIN 16.2 10/06/2020   Lab Results  Component Value Date   TSH 1.540 10/06/2020   Lab Results  Component Value Date   CHOL 138 10/06/2020   HDL 74 10/06/2020   LDLCALC 51 10/06/2020   TRIG 66 10/06/2020   CHOLHDL 1.9 10/06/2020   Lab Results  Component Value Date   VD25OH 56.4 10/06/2020   Lab Results  Component Value Date   WBC 4.3 10/06/2020   HGB 14.4 10/06/2020   HCT 42.2 10/06/2020   MCV 89 10/06/2020   PLT 205 10/06/2020   No results found for: IRON, TIBC, FERRITIN  Obesity Behavioral Intervention:   Approximately 15 minutes were spent on the discussion below.  ASK: We discussed the diagnosis of obesity with Onalee Hua today and Onalee Hua agreed to give [place] permission to discuss obesity behavioral modification therapy today.  ASSESS: Reynard has the diagnosis of obesity and his BMI today is 30.06. Bergen is in  the action stage of change.   ADVISE: Tobie was educated on the multiple health risks of obesity as well as the benefit of weight loss to improve his health. He was advised of the need for long term treatment and the importance of lifestyle modifications to improve his current health and to decrease his risk of future health problems.  AGREE: Multiple dietary modification options and treatment options were discussed and Tank agreed to follow the recommendations documented in the above note.  ARRANGE: Sixto was educated on the importance of frequent visits to treat obesity as outlined per CMS and USPSTF guidelines and agreed to schedule his next follow up appointment today.  Attestation Statements:   Reviewed by clinician on day of visit: allergies, medications, problem list, medical history, surgical history, family history, social history, and previous encounter notes.   Trude Mcburney, am acting as transcriptionist for [employer], DO.  I have reviewed the above documentation for accuracy and completeness, and I agree with the above. Carlye Grippe, D.O.  The 21st Century Cures Act was signed into law in 2016 which includes the topic of electronic health records.  This provides immediate access to information in MyChart.  This includes consultation notes, operative notes, office notes, lab results and pathology reports.  If you have any questions about what you read please let us know at your next visit so we can discuss your concerns and take corrective action if need be.  We are right here with you.

## 2020-12-16 ENCOUNTER — Ambulatory Visit (INDEPENDENT_AMBULATORY_CARE_PROVIDER_SITE_OTHER): Payer: Medicare PPO | Admitting: Physician Assistant

## 2020-12-31 ENCOUNTER — Encounter (INDEPENDENT_AMBULATORY_CARE_PROVIDER_SITE_OTHER): Payer: Self-pay | Admitting: Family Medicine

## 2020-12-31 ENCOUNTER — Ambulatory Visit (INDEPENDENT_AMBULATORY_CARE_PROVIDER_SITE_OTHER): Payer: Medicare PPO | Admitting: Family Medicine

## 2020-12-31 ENCOUNTER — Other Ambulatory Visit: Payer: Self-pay

## 2020-12-31 VITALS — BP 109/68 | HR 53 | Temp 98.2°F | Ht 67.0 in | Wt 194.0 lb

## 2020-12-31 DIAGNOSIS — E669 Obesity, unspecified: Secondary | ICD-10-CM | POA: Diagnosis not present

## 2020-12-31 DIAGNOSIS — Z6831 Body mass index (BMI) 31.0-31.9, adult: Secondary | ICD-10-CM | POA: Diagnosis not present

## 2020-12-31 DIAGNOSIS — E65 Localized adiposity: Secondary | ICD-10-CM

## 2020-12-31 DIAGNOSIS — I10 Essential (primary) hypertension: Secondary | ICD-10-CM

## 2021-01-05 DIAGNOSIS — Z6831 Body mass index (BMI) 31.0-31.9, adult: Secondary | ICD-10-CM | POA: Insufficient documentation

## 2021-01-05 DIAGNOSIS — E669 Obesity, unspecified: Secondary | ICD-10-CM | POA: Insufficient documentation

## 2021-01-05 NOTE — Progress Notes (Signed)
Chief Complaint:   OBESITY Luis Mccann is here to discuss his progress with his obesity treatment plan along with follow-up of his obesity related diagnoses. Luis Mccann is on keeping a food journal and adhering to recommended goals of 1400-1500 calories and 100 grams of protein and states he is following his eating plan approximately 85% of the time. Luis Mccann states he is walking 2-3 miles 2 times per week.  Today's visit was #: 5 Starting weight: 203 lbs Starting date: 10/06/2020 Today's weight: 194 lbs Today's date: 12/31/2020 Total lbs lost to date: 9 lbs Total lbs lost since last in-office visit: 0  Interim History: Luis Mccann feels he has made some healthy changes in his lifestyle. He has been getting a lot of protein from rotisserie chicken. He feels his wife inadvertently sabotages him at times in that she controls her weight in a different method.  His goal weight is 175 lbs (27 BMI).  Subjective:   1. Essential hypertension Luis Mccann's hypertension is well controlled with Norvasc, Benicar and Aldactone. He denies chest pain and shortness of breath.   BP Readings from Last 3 Encounters:  12/31/20 109/68  11/25/20 (!) 93/59  11/10/20 112/71     2. Visceral obesity Luis Mccann's Visceral obesity rating is currently 15, down from 18 at start of program. Goal is <12. His main goal at weight loss is to lose "belly fat".  Assessment/Plan:   1. Essential hypertension Luis Mccann will continue all anti-hypertensives .   2. Visceral obesity We discussed goal of 12 or less for Visceral Obesity rating. He will continue meal plan and exercise.  3. Obesity with current BMI of 30.38 Luis Mccann is currently in the action stage of change. As such, his goal is to continue with weight loss efforts. He has agreed to keeping a food journal and adhering to recommended goals of 1400-1500 calories and 100-120 grams of protein daily.  We discussed optimal heart rate for exercise. He was advised that 110-115 bpm would be a  good goal.  He will lift weights 2-3 times per week at the gym.  We discussed the importance of protein.  Exercise goals:  Luis Mccann will add resistance training 2-3 times per week.  Behavioral modification strategies: increasing lean protein intake.  Luis Mccann has agreed to follow-up with our clinic in 2-3 weeks with Luis Mccann, PAC or Dr. Sharee Holster.   Objective:   Blood pressure 109/68, pulse (!) 53, temperature 98.2 F (36.8 C), height 5\' 7"  (1.702 m), weight 194 lb (88 kg), SpO2 98 %. Body mass index is 30.38 kg/m.  General: Cooperative, alert, well developed, in no acute distress. HEENT: Conjunctivae and lids unremarkable. Cardiovascular: Regular rhythm.  Lungs: Normal work of breathing. Neurologic: No focal deficits.   Lab Results  Component Value Date   CREATININE 1.26 10/06/2020   BUN 28 (H) 10/06/2020   NA 140 10/06/2020   K 4.7 10/06/2020   CL 102 10/06/2020   CO2 24 10/06/2020   Lab Results  Component Value Date   ALT 24 10/06/2020   AST 25 10/06/2020   ALKPHOS 39 (L) 10/06/2020   BILITOT 0.7 10/06/2020   Lab Results  Component Value Date   HGBA1C 5.6 10/06/2020   Lab Results  Component Value Date   INSULIN 16.2 10/06/2020   Lab Results  Component Value Date   TSH 1.540 10/06/2020   Lab Results  Component Value Date   CHOL 138 10/06/2020   HDL 74 10/06/2020   LDLCALC 51 10/06/2020   TRIG 66  10/06/2020   CHOLHDL 1.9 10/06/2020   Lab Results  Component Value Date   VD25OH 56.4 10/06/2020   Lab Results  Component Value Date   WBC 4.3 10/06/2020   HGB 14.4 10/06/2020   HCT 42.2 10/06/2020   MCV 89 10/06/2020   PLT 205 10/06/2020   No results found for: IRON, TIBC, FERRITIN   Attestation Statements:   Reviewed by clinician on day of visit: allergies, medications, problem list, medical history, surgical history, family history, social history, and previous encounter notes.  I, Jackson Latino, RMA, am acting as Energy manager for [employer], FNP.   I have reviewed the above documentation for accuracy and completeness, and I agree with the above. -  Jesse Sans, FNP

## 2021-01-12 DIAGNOSIS — G4733 Obstructive sleep apnea (adult) (pediatric): Secondary | ICD-10-CM | POA: Diagnosis not present

## 2021-01-28 ENCOUNTER — Ambulatory Visit (INDEPENDENT_AMBULATORY_CARE_PROVIDER_SITE_OTHER): Payer: Medicare PPO | Admitting: Family Medicine

## 2021-02-03 DIAGNOSIS — Z23 Encounter for immunization: Secondary | ICD-10-CM | POA: Diagnosis not present

## 2021-02-18 ENCOUNTER — Encounter (INDEPENDENT_AMBULATORY_CARE_PROVIDER_SITE_OTHER): Payer: Self-pay | Admitting: Family Medicine

## 2021-02-18 ENCOUNTER — Ambulatory Visit (INDEPENDENT_AMBULATORY_CARE_PROVIDER_SITE_OTHER): Payer: Medicare PPO | Admitting: Family Medicine

## 2021-02-18 ENCOUNTER — Other Ambulatory Visit: Payer: Self-pay

## 2021-02-18 VITALS — BP 118/67 | HR 49 | Temp 98.3°F | Ht 67.0 in | Wt 193.0 lb

## 2021-02-18 DIAGNOSIS — I10 Essential (primary) hypertension: Secondary | ICD-10-CM

## 2021-02-18 DIAGNOSIS — E669 Obesity, unspecified: Secondary | ICD-10-CM | POA: Diagnosis not present

## 2021-02-18 DIAGNOSIS — Z6831 Body mass index (BMI) 31.0-31.9, adult: Secondary | ICD-10-CM | POA: Diagnosis not present

## 2021-02-22 NOTE — Progress Notes (Signed)
Chief Complaint:   OBESITY Dewel is here to discuss his progress with his obesity treatment plan along with follow-up of his obesity related diagnoses. Latif is on keeping a food journal and adhering to recommended goals of 1400-1500 calories and 100-120 grams of protein daily and states he is following his eating plan approximately 60-65% of the time. Keithen states he is walking and doing pulley weights for 60 minutes 3 times per week.  Today's visit was #: 6 Starting weight: 203 lbs Starting date: 10/06/2020 Today's weight: 193 lbs Today's date: 02/18/2021 Total lbs lost to date: 10 Total lbs lost since last in-office visit: 1  Interim History: Nyeem had COVID 6-8 weeks ago, and he stopped walking his 4 miles per day outside. He is now walking on the treadmill for 40 minutes 3 days per week.  Subjective:   1. Essential hypertension Anuj's blood pressure is at goal today. Cardiovascular ROS: no chest pain or dyspnea on exertion.  BP Readings from Last 3 Encounters:  02/18/21 118/67  12/31/20 109/68  11/25/20 (!) 93/59   Assessment/Plan:  No orders of the defined types were placed in this encounter.   There are no discontinued medications.   No orders of the defined types were placed in this encounter.    1. Essential hypertension Neymar will continue working on healthy weight loss and exercise to improve blood pressure control. We will watch for signs of hypotension as he continues his lifestyle modifications.  2. Obesity with current BMI of 30.4 Cloyce is currently in the action stage of change. As such, his goal is to continue with weight loss efforts. He has agreed to keeping a food journal and adhering to recommended goals of 1400-1500 calories and 100-120 grams of protein daily.   Vernal is to focus on hitting his protein goals and following the meal plan, which I gave him again.  Exercise goals: As is.  Behavioral modification strategies: increasing lean protein  intake, decreasing simple carbohydrates, no skipping meals, and planning for success.  Omarion has agreed to follow-up with our clinic in 3 weeks. He was informed of the importance of frequent follow-up visits to maximize his success with intensive lifestyle modifications for his multiple health conditions.   Objective:   Blood pressure 118/67, pulse (!) 49, temperature 98.3 F (36.8 C), height 5\' 7"  (1.702 m), weight 193 lb (87.5 kg), SpO2 97 %. Body mass index is 30.23 kg/m.  General: Cooperative, alert, well developed, in no acute distress. HEENT: Conjunctivae and lids unremarkable. Cardiovascular: Regular rhythm.  Lungs: Normal work of breathing. Neurologic: No focal deficits.   Lab Results  Component Value Date   CREATININE 1.26 10/06/2020   BUN 28 (H) 10/06/2020   NA 140 10/06/2020   K 4.7 10/06/2020   CL 102 10/06/2020   CO2 24 10/06/2020   Lab Results  Component Value Date   ALT 24 10/06/2020   AST 25 10/06/2020   ALKPHOS 39 (L) 10/06/2020   BILITOT 0.7 10/06/2020   Lab Results  Component Value Date   HGBA1C 5.6 10/06/2020   Lab Results  Component Value Date   INSULIN 16.2 10/06/2020   Lab Results  Component Value Date   TSH 1.540 10/06/2020   Lab Results  Component Value Date   CHOL 138 10/06/2020   HDL 74 10/06/2020   LDLCALC 51 10/06/2020   TRIG 66 10/06/2020   CHOLHDL 1.9 10/06/2020   Lab Results  Component Value Date   VD25OH 56.4 10/06/2020  Lab Results  Component Value Date   WBC 4.3 10/06/2020   HGB 14.4 10/06/2020   HCT 42.2 10/06/2020   MCV 89 10/06/2020   PLT 205 10/06/2020   No results found for: IRON, TIBC, FERRITIN  Obesity Behavioral Intervention:   Approximately 15 minutes were spent on the discussion below.  ASK: We discussed the diagnosis of obesity with Onalee Hua today and Onalee Hua agreed to give [place] permission to discuss obesity behavioral modification therapy today.  ASSESS: Adyen has the diagnosis of obesity and his BMI  today is 30.4. Beecher is in the action stage of change.   ADVISE: Joseh was educated on the multiple health risks of obesity as well as the benefit of weight loss to improve his health. He was advised of the need for long term treatment and the importance of lifestyle modifications to improve his current health and to decrease his risk of future health problems.  AGREE: Multiple dietary modification options and treatment options were discussed and Zakai agreed to follow the recommendations documented in the above note.  ARRANGE: Brenn was educated on the importance of frequent visits to treat obesity as outlined per CMS and USPSTF guidelines and agreed to schedule his next follow up appointment today.  Attestation Statements:   Reviewed by clinician on day of visit: allergies, medications, problem list, medical history, surgical history, family history, social history, and previous encounter notes.   Trude Mcburney, am acting as transcriptionist for [employer], DO.  I have reviewed the above documentation for accuracy and completeness, and I agree with the above. Carlye Grippe, D.O.  The 21st Century Cures Act was signed into law in 2016 which includes the topic of electronic health records.  This provides immediate access to information in MyChart.  This includes consultation notes, operative notes, office notes, lab results and pathology reports.  If you have any questions about what you read please let us know at your next visit so we can discuss your concerns and take corrective action if need be.  We are right here with you.

## 2021-04-15 ENCOUNTER — Ambulatory Visit (INDEPENDENT_AMBULATORY_CARE_PROVIDER_SITE_OTHER): Payer: Medicare PPO | Admitting: Family Medicine

## 2021-04-15 DIAGNOSIS — G894 Chronic pain syndrome: Secondary | ICD-10-CM | POA: Diagnosis not present

## 2021-04-28 DIAGNOSIS — N200 Calculus of kidney: Secondary | ICD-10-CM | POA: Diagnosis not present

## 2021-04-28 DIAGNOSIS — R311 Benign essential microscopic hematuria: Secondary | ICD-10-CM | POA: Diagnosis not present

## 2021-05-05 DIAGNOSIS — N281 Cyst of kidney, acquired: Secondary | ICD-10-CM | POA: Diagnosis not present

## 2021-05-05 DIAGNOSIS — R350 Frequency of micturition: Secondary | ICD-10-CM | POA: Diagnosis not present

## 2021-05-05 DIAGNOSIS — N201 Calculus of ureter: Secondary | ICD-10-CM | POA: Diagnosis not present

## 2021-05-05 DIAGNOSIS — N200 Calculus of kidney: Secondary | ICD-10-CM | POA: Diagnosis not present

## 2021-05-05 DIAGNOSIS — K5730 Diverticulosis of large intestine without perforation or abscess without bleeding: Secondary | ICD-10-CM | POA: Diagnosis not present

## 2021-05-06 ENCOUNTER — Other Ambulatory Visit: Payer: Self-pay | Admitting: Urology

## 2021-05-07 ENCOUNTER — Telehealth: Payer: Self-pay | Admitting: Cardiovascular Disease

## 2021-05-07 MED ORDER — AMLODIPINE BESYLATE 10 MG PO TABS: 10.0000 mg | ORAL_TABLET | Freq: Every day | ORAL | 0 refills | Status: DC

## 2021-05-07 MED ORDER — SPIRONOLACTONE 25 MG PO TABS: 25.0000 mg | ORAL_TABLET | Freq: Every day | ORAL | 0 refills | Status: DC

## 2021-05-07 MED ORDER — OLMESARTAN MEDOXOMIL-HCTZ 40-25 MG PO TABS: 1.0000 | ORAL_TABLET | Freq: Every day | ORAL | 0 refills | Status: DC

## 2021-05-07 NOTE — Telephone Encounter (Signed)
°*  STAT* If patient is at the pharmacy, call can be transferred to refill team.   1. Which medications need to be refilled? (please list name of each medication and dose if known)  amLODipine (NORVASC) 10 MG tablet spironolactone (ALDACTONE) 25 MG tablet olmesartan-hydrochlorothiazide (BENICAR HCT) 40-25 MG tablet  2. Which pharmacy/location (including street and city if local pharmacy) is medication to be sent to? [facility] - [place], [place] - [address]  3. Do they need a 30 day or 90 day supply? 90 day

## 2021-05-07 NOTE — Telephone Encounter (Signed)
Refills sent to pharmacy. 

## 2021-05-17 ENCOUNTER — Telehealth: Payer: Self-pay | Admitting: Cardiovascular Disease

## 2021-05-17 ENCOUNTER — Other Ambulatory Visit: Payer: Self-pay | Admitting: Cardiovascular Disease

## 2021-05-17 MED ORDER — SPIRONOLACTONE 25 MG PO TABS: 25.0000 mg | ORAL_TABLET | Freq: Every day | ORAL | 4 refills | Status: DC

## 2021-05-17 NOTE — Telephone Encounter (Signed)
°*  STAT* If patient is at the pharmacy, call can be transferred to refill team.   1. Which medications need to be refilled? (please list name of each medication and dose if known) amLODipine (NORVASC) 10 MG tablet  spironolactone (ALDACTONE) 25 MG tablet  2. Which pharmacy/location (including street and city if local pharmacy) is medication to be sent to?[place], [place]  3. Do they need a 30 day or 90 day supply? 90 ds   Rxs were refilled for 30 days pt states these should be a 90 ds... please correct this for the pt

## 2021-05-18 ENCOUNTER — Encounter (HOSPITAL_BASED_OUTPATIENT_CLINIC_OR_DEPARTMENT_OTHER): Payer: Self-pay | Admitting: Urology

## 2021-05-18 ENCOUNTER — Other Ambulatory Visit: Payer: Self-pay

## 2021-05-18 NOTE — Progress Notes (Signed)
Pre-procedure phone call completed.  Patient's wife Hilda Blades will be his ride and 24 hour caregiver.  Patient does have OSA but does not use his CPAP.  He has a cardiologist but has not had any episodes of dysrhythmia or A-fib.

## 2021-05-19 ENCOUNTER — Other Ambulatory Visit: Payer: Self-pay | Admitting: Urology

## 2021-05-19 ENCOUNTER — Telehealth (INDEPENDENT_AMBULATORY_CARE_PROVIDER_SITE_OTHER): Payer: Self-pay | Admitting: Family Medicine

## 2021-05-19 ENCOUNTER — Telehealth: Payer: Self-pay | Admitting: Cardiovascular Disease

## 2021-05-19 NOTE — Telephone Encounter (Signed)
Marlowe Kays with [facility] called to request a copy of a signed resulted EKG. She see one in the system, but it has not been signed. Phone number [phone] and fax number [phone].

## 2021-05-19 NOTE — Telephone Encounter (Signed)
Returned call to pt informed pt that we cannot "just do an EKG" also pt has not been seen since 10-2019. Informed pt that I will forward message to Dr C for EKG order and we will call him back with his response. Pt states that he wants an explanation why we cannot do an EKG. Informed pt that I will ask Dr C and we will call him back/ verbalized understanding. Pt states that he is in pain and needs to have this done ASAP.

## 2021-05-19 NOTE — Telephone Encounter (Signed)
° °  Pt said, he will have a lithotripsy tomorrow, he received a call today from his doctor that he needs an EKG. Advised if this for clearance the doctor needs to send clearance request, pt keep saying he doesn't need that and only need an EKG, pt said he needs to get it even without and appt because its an easy to do. He requested to speak with a Dr. Renaye Rakers nurse

## 2021-05-20 ENCOUNTER — Ambulatory Visit (INDEPENDENT_AMBULATORY_CARE_PROVIDER_SITE_OTHER): Payer: Medicare PPO | Admitting: Family Medicine

## 2021-05-21 DIAGNOSIS — Z01818 Encounter for other preprocedural examination: Secondary | ICD-10-CM | POA: Diagnosis not present

## 2021-05-21 DIAGNOSIS — N200 Calculus of kidney: Secondary | ICD-10-CM | POA: Diagnosis not present

## 2021-05-25 ENCOUNTER — Encounter: Payer: Self-pay | Admitting: Cardiovascular Disease

## 2021-05-25 NOTE — Telephone Encounter (Signed)
Returned the call to [place]ity at the number provided. Luis Mccann has been made aware that the patient stated that he had an EKG at his PCP recently. They will reach out to him and the PCP and call back if anything further is needed.

## 2021-05-25 NOTE — Telephone Encounter (Signed)
Called the patient. He got the EKG done with his PCP. Nothing further needed at this time.

## 2021-05-25 NOTE — Telephone Encounter (Signed)
ERROR

## 2021-05-25 NOTE — Telephone Encounter (Signed)
[place] with [facility] and informed her via secure voicemail that Dr. Raliegh Scarlet was unable to provide clearance for pt to have his procedure. This clearance would need to come from pt's cardiologist.

## 2021-05-25 NOTE — Telephone Encounter (Signed)
Bev with [facility] is following up. Patient is scheduled for Lithotripsy, however, patient has not had a recent EKG. The only EKG's they have record of are from 10/07/19 and 10/18/19 (per Bev). She states the 2 EKG's differ so they are needing cardiology to sign off before patient can have Lithotripsy. Looks like patient has not been seen since 10/08/19 and may need clearance prior to procedure. Please return call to further discuss when able

## 2021-05-25 NOTE — Telephone Encounter (Signed)
Luis Mccann is returning call. RN unavailable for transfer. Please return call when able.

## 2021-05-25 NOTE — Telephone Encounter (Signed)
Bev with [facility] is following up. Patient is scheduled for Lithotripsy, however, patient has not had a recent EKG. The only EKG's they have record of are from 10/07/19 and 10/18/19 (per Bev). She states the 2 EKG's differ so they are needing cardiology to sign off before patient can have Lithotripsy. Looks like patient has not been seen since 10/08/19 and may need clearance prior to procedure. Please return call to further discuss when able °

## 2021-05-25 NOTE — Telephone Encounter (Signed)
Left a message for Luis Mccann to call back. The patient stated that he had a EKG with his PCP. Cardiology had not been notified of a cardiac clearance needed.

## 2021-05-26 ENCOUNTER — Encounter: Payer: Self-pay | Admitting: Cardiovascular Disease

## 2021-05-26 ENCOUNTER — Other Ambulatory Visit: Payer: Self-pay

## 2021-05-26 ENCOUNTER — Telehealth: Payer: Self-pay | Admitting: Cardiology

## 2021-05-26 MED ORDER — SPIRONOLACTONE 25 MG PO TABS: 25.0000 mg | ORAL_TABLET | Freq: Every day | ORAL | 0 refills | Status: DC

## 2021-05-26 MED ORDER — AMLODIPINE BESYLATE 10 MG PO TABS: 10.0000 mg | ORAL_TABLET | Freq: Every day | ORAL | 0 refills | Status: DC

## 2021-05-26 MED ORDER — OLMESARTAN MEDOXOMIL-HCTZ 40-25 MG PO TABS: 1.0000 | ORAL_TABLET | Freq: Every day | ORAL | 0 refills | Status: DC

## 2021-05-26 NOTE — Telephone Encounter (Signed)
° °  Name: Luis Mccann  DOB: [birth year]  MRN: [id number]  Primary Cardiologist: Thurmon Fair, MD  Chart reviewed as part of pre-operative protocol coverage. Because of Luis Mccann's past medical history and time since last visit, he will require a follow-up visit in order to better assess preoperative cardiovascular risk.  Pre-op covering staff: - Please schedule appointment and call patient to inform them. If patient already had an upcoming appointment within acceptable timeframe, please add "pre-op clearance" to the appointment notes so provider is aware. - Please contact requesting surgeon's office via preferred method (i.e, phone, fax) to inform them of need for appointment prior to surgery.  If applicable, this message will also be routed to pharmacy pool and/or primary cardiologist for input on holding anticoagulant/antiplatelet agent as requested below so that this information is available to the clearing provider at time of patient's appointment.   Marcelino Duster, PA  05/26/2021, 5:23 PM

## 2021-05-26 NOTE — Telephone Encounter (Signed)
New Message:    Patient says he needs his medicine 90 days at the time for insurance reason. He says it is less expensive that way. He said the first avaialble appointment he could get with Dr C was May.

## 2021-05-26 NOTE — Telephone Encounter (Signed)
Patient uses mail order- they need a 90 day supply. I called patient, he is aware he is overdue for visit, he does have appointment scheduled for May. I did refill for 90 day, and advised patient to keep appointment in May and to call. I did not give refills, patient should call back after 90 days, to advise that he has kept his appointment.  Thanks!

## 2021-05-26 NOTE — Progress Notes (Signed)
Cardiology Office Note:    Date:  05/27/2021   ID:  Luis Mccann, DOB August 01, 1955, MRN [id number]  PCP:  Sigmund HazelMiller, Lisa, MD   [facility] Providers Cardiologist:  Thurmon FairMihai Croitoru, MD Cardiology APP:  Marcelino Dusteruke, Mykel Mohl Nicole, [place]PA { Referring MD: Sigmund HazelMiller, Lisa, MD   Chief Complaint  Patient presents with   Pre-op Exam    History of Present Illness:    Luis Mccann is a 66 y.o. male with a hx of paroxysmal atrial fibrillation, hypertension, OSA on CPAP, and mild dilatation of the ascending aorta.  He had a remote history of a single episode of atrial fibrillation.  He had a repeat echocardiogram January 2020 for murmur heard on exam.  Echocardiogram showed preserved LVEF of 55 to 60%, no regional wall motion abnormality, ascending aorta measuring 40 mm, no significant valvular disease, and a small pericardial effusion.  Heart monitor in June 2022 showed mild sinus bradycardia with heart rate less than 60 bpm roughly 50% of the time, this was mostly at night.  There is preserved circadian rhythm variation.  He had rare PACs and PVCs, but no atrial arrhythmia or significant sinus pauses.  No indication for PPM at this time.  He was last seen by Dr. Royann Shiversroitoru in June 2021 and was doing well at that time.  Of note he has had a lot of orthopedic issues in the past with multiple surgeries.  He presents today for preoperative risk evaluation for lithotripsy.  This will be his 7th lithotripsy. Apparently there was some concern regarding an abnormal EKG or echocardiogram.  EKG today shows known LBBB and sinus bradycardia. Q waves inferior and anterior leads are not new.   He exercises regularly by walking 2 miles briskly several times weekly without angina. He has no cardiac complaints.    Past Medical History:  Diagnosis Date   Arthritis    osteoarthritis   Back pain    Dysrhythmia 2000   single episode of afib with RVR with normal echo and rare atrial/ventricular ectopy on f/u [facility])    History of kidney stones    Hypertension    Joint pain    Obesity (BMI 30.0-34.9) 04/04/2013   Osteoarthritis    Pneumonia    hx of age 35   Severe obstructive sleep apnea    do not wear mask/   Sleep apnea    SOBOE (shortness of breath on exertion)     Past Surgical History:  Procedure Laterality Date   ACHILLES TENDON REPAIR Left 1987 and 1990   COLONOSCOPY     DOPPLER ECHOCARDIOGRAPHY  06/12/1998   normal doppler examination, normal overall LV systolic function   EXTRACORPOREAL SHOCK WAVE LITHOTRIPSY Left 01/27/2020   Procedure: EXTRACORPOREAL SHOCK WAVE LITHOTRIPSY (ESWL);  Surgeon: Heloise PurpuraBorden, Lester, MD;  [facility];  Service: Urology;  Laterality: Left;   HIP SURGERY Right    arthoscopic x1, one other surgery    INCISION AND DRAINAGE HIP Right 09/24/2013   Procedure: RIGHT HIP BEARING SURFACE REVISION;  Surgeon: Loanne DrillingFrank Aluisio V, MD;  Location: [facility];  Service: Orthopedics;  Laterality: Right;   JOINT REPLACEMENT  1995   right hip   KIDNEY STONE SURGERY     LITHOTRIPSY     x4   NASAL SEPTOPLASTY W/ TURBINOPLASTY Bilateral 01/02/2013   Procedure: NASAL SEPTOPLASTY WITH TURBINATE REDUCTION;  Surgeon: Flo ShanksKarol Wolicki, MD;  [facility];  Service: ENT;  Laterality: Bilateral;   NASAL SEPTUM SURGERY  1980's  RENAL ARTERY DUPLEX  09/22/2004   abdominal aorta-normal taper with no suggestion of aneurysmal dilatation/diameter reduction. normal patency of right and left kidney arteries. right and left kidneys equal in size, symmetrical in shape with normal cortex and medulla with normal resistance indices and normal echogencity with no evidence of hydronephrosis.   SHOULDER SURGERY Left 1990's   for burr   SHOULDER SURGERY Right 2010, 2012   abuttment and rotator cuff repair   SLEEP STUDY  10/25/2011   AHI during total sleep time - 88.39/hr, AHI during REM 75.00/hr    Current Medications: Current Meds  Medication Sig   amLODipine (NORVASC) 10 MG tablet Take 1  tablet (10 mg total) by mouth daily.   Ascorbic Acid (VITAMIN C) 1000 MG tablet Take 500 mg by mouth daily.   Ferrous Sulfate (IRON) 28 MG TABS Take 1 tablet by mouth. Monday Wednesday Friday   ibuprofen (ADVIL) 800 MG tablet 1 tablet with food or milk as needed   Multiple Vitamins-Minerals (MULTIVITAMIN ADULT PO) Take by mouth.   olmesartan-hydrochlorothiazide (BENICAR HCT) 40-25 MG tablet Take 1 tablet by mouth daily.   oxyCODONE-acetaminophen (PERCOCET/ROXICET) 5-325 MG tablet Take by mouth.   spironolactone (ALDACTONE) 25 MG tablet Take 1 tablet (25 mg total) by mouth daily. Keep upcoming appointment for future refills   tamsulosin (FLOMAX) 0.4 MG CAPS capsule Take 0.4 mg by mouth at bedtime.     Allergies:   Patient has no known allergies.   Social History   Socioeconomic History   Marital status: Married    Spouse name: Sherrlyn HockDebra Garguilo   Number of children: Not on file   Years of education: Not on file   Highest education level: Not on file  Occupational History   Occupation: IT trainerCPA, Tax Airline pilotAccountant  Tobacco Use   Smoking status: Former    Types: Cigarettes   Smokeless tobacco: Never   Tobacco comments:    quit 30 years ago-social smoker  Vaping Use   Vaping Use: Never used  Substance and Sexual Activity   Alcohol use: Yes    Comment: occasional   Drug use: No   Sexual activity: Not on file  Other Topics Concern   Not on file  Social History Narrative   Not on file   Social Determinants of Health   Financial Resource Strain: Not on file  Food Insecurity: Not on file  Transportation Needs: Not on file  Physical Activity: Not on file  Stress: Not on file  Social Connections: Not on file     Family History: The patient's family history includes Cancer (age of onset: 2646) in his sister; Hypertension in his mother; Stroke in his mother.  ROS:   Please see the history of present illness.     All other systems reviewed and are negative.  EKGs/Labs/Other Studies  Reviewed:    The following studies were reviewed today:  Heart monitor 09/2020: The dominant rhythm is normal sinus and mild sinus bradycardia. The heart rate is less than 60 bpm roughly 50% of the time, mostly at night. There is preserved circadian rhythm variation There are very rare PACs and PVCs and there is no evidence of complex atrial or ventricular arrhythmia. There are no significant sinus pauses.   Normal event monitor. There is a tendency towards bradycardia, but there is preserved daytime heart rate response and the study does not meet criteria for chronotropic incompetence. There is no evidence of atrial fibrillation.   Echo 05/09/18: Study Conclusions   - Left  ventricle: The cavity size was normal. Systolic function was    normal. The estimated ejection fraction was in the range of 55%    to 60%. Wall motion was normal; there were no regional wall    motion abnormalities. Left ventricular diastolic function    parameters were normal.  - Aorta: Ascending aortic diameter: 40 mm (S).  - Ascending aorta: The ascending aorta was mildly dilated.  - Mitral valve: There was trivial regurgitation.  - Atrial septum: No defect or patent foramen ovale was identified.  - Tricuspid valve: There was trivial regurgitation.  - Pulmonic valve: There was mild regurgitation.  - Pericardium, extracardiac: A small pericardial effusion was    identified.   Impressions:   - Normal LV systolic and diastolic function. No significant valve    disease. Small pericardial effusion best seen anterior    (parasternal views) without evidence of hemodynamic compromise.   EKG:  EKG is  ordered today.  The ekg ordered today demonstrates sinus bradycardia with HR 53, anterior infarct, old, left axis deviation, incomplete LBBB  Recent Labs: 10/06/2020: ALT 24; BUN 28; Creatinine, Ser 1.26; Hemoglobin 14.4; Platelets 205; Potassium 4.7; Sodium 140; TSH 1.540  Recent Lipid Panel    Component Value  Date/Time   CHOL 138 10/06/2020 1051   TRIG 66 10/06/2020 1051   HDL 74 10/06/2020 1051   CHOLHDL 1.9 10/06/2020 1051   LDLCALC 51 10/06/2020 1051     Risk Assessment/Calculations:           Physical Exam:    VS:  BP 118/62    Pulse (!) 53    Ht 5\' 9"  (1.753 m)    Wt 198 lb 12.8 oz (90.2 kg)    SpO2 97%    BMI 29.36 kg/m     Wt Readings from Last 3 Encounters:  05/27/21 198 lb 12.8 oz (90.2 kg)  02/18/21 193 lb (87.5 kg)  12/31/20 194 lb (88 kg)     GEN:  Well nourished, well developed in no acute distress HEENT: Normal NECK: No JVD; No carotid bruits LYMPHATICS: No lymphadenopathy CARDIAC: RRR, no murmurs, rubs, gallops RESPIRATORY:  Clear to auscultation without rales, wheezing or rhonchi  ABDOMEN: Soft, non-tender, non-distended MUSCULOSKELETAL:  No edema; No deformity  SKIN: Warm and dry NEUROLOGIC:  Alert and oriented x 3 PSYCHIATRIC:  Normal affect   ASSESSMENT:    1. Paroxysmal atrial fibrillation (HCC)   2. Essential hypertension   3. Preoperative clearance   4. Mild dilation of ascending aorta (HCC)   5. OSA (obstructive sleep apnea)    PLAN:    In order of problems listed above:  Paroxysmal atrial fibrillation One episode without structural heart abnormalities.  Not on anticoagulation. No recent palpitations, recent monitor without arrhythmias.    Hypertension BP well controlled. Continue present medications.    Ascending aortic dilation - 40 mm on echo in 2020 - I will reach out to Dr. 2021 to see if he wants a repeat echo this year - if so, will need to be ordered - this should not hold up his procedure on Monday - BP well controlled   OSA on CPAP - compliant   Preoperative risk evaluation EKG, echocardiogram, and heart monitor reviewed. According to the RCRI, he is at acceptable risk to proceed with lithotripsy.     Follow up with me or Dr. Wednesday in 1 year.      Medication Adjustments/Labs and Tests Ordered: Current  medicines are reviewed at length with  the patient today.  Concerns regarding medicines are outlined above.  No orders of the defined types were placed in this encounter.  No orders of the defined types were placed in this encounter.   Patient Instructions   Medication Instructions:  NONE ordered at this time of appointment   *If you need a refill on your cardiac medications before your next appointment, please call your pharmacy*   Lab Work: NONE ordered at this time of appointment   If you have labs (blood work) drawn today and your tests are completely normal, you will receive your results only by: MyChart Message (if you have MyChart) OR A paper copy in the mail If you have any lab test that is abnormal or we need to change your treatment, we will call you to review the results.   Testing/Procedures: NONE ordered at this time of appointment     Follow-Up: At [facility], you and your health needs are our priority.  As part of our continuing mission to provide you with exceptional heart care, we have created designated Provider Care Teams.  These Care Teams include your primary Cardiologist (physician) and Advanced Practice Providers (APPs -  Physician Assistants and Nurse Practitioners) who all work together to provide you with the care you need, when you need it.  We recommend signing up for the patient portal called "MyChart".  Sign up information is provided on this After Visit Summary.  MyChart is used to connect with patients for Virtual Visits (Telemedicine).  Patients are able to view lab/test results, encounter notes, upcoming appointments, etc.  Non-urgent messages can be sent to your provider as well.   To learn more about what you can do with MyChart, go to [email or web address].    Your next appointment:   1 year(s)  The format for your next appointment:   In Person  Provider:   APP or Thurmon Fair, MD     Other Instructions None      Signed, Marcelino Duster, [place]  05/27/2021 10:05 AM    [facility]

## 2021-05-26 NOTE — Telephone Encounter (Signed)
° °  Pre-operative Risk Assessment    Patient Name: Luis Mccann  DOB: 05-Jan-1956 MRN: [id number]      Request for Surgical Clearance    Procedure:   left extracorporeal shockwave lithotripsy  Date of Surgery:  Clearance 05/31/21                                 Surgeon:  DR. Heloise Purpura  Surgeon's Group or Practice Name:  [facility]  Phone number:  [phone] Fax number:  [phone]   Type of Clearance Requested:   - Medical    Type of Anesthesia:  Local    Additional requests/questions:      SignedFilomena Jungling   05/26/2021, 2:33 PM

## 2021-05-26 NOTE — Telephone Encounter (Signed)
Error

## 2021-05-27 ENCOUNTER — Encounter (HOSPITAL_BASED_OUTPATIENT_CLINIC_OR_DEPARTMENT_OTHER): Payer: Self-pay | Admitting: Urology

## 2021-05-27 ENCOUNTER — Encounter: Payer: Self-pay | Admitting: Physician Assistant

## 2021-05-27 ENCOUNTER — Ambulatory Visit: Payer: Medicare PPO | Admitting: Physician Assistant

## 2021-05-27 ENCOUNTER — Other Ambulatory Visit: Payer: Self-pay

## 2021-05-27 VITALS — BP 118/62 | HR 53 | Ht 69.0 in | Wt 198.8 lb

## 2021-05-27 DIAGNOSIS — I480 Paroxysmal atrial fibrillation: Secondary | ICD-10-CM

## 2021-05-27 DIAGNOSIS — Z01818 Encounter for other preprocedural examination: Secondary | ICD-10-CM | POA: Diagnosis not present

## 2021-05-27 DIAGNOSIS — G4733 Obstructive sleep apnea (adult) (pediatric): Secondary | ICD-10-CM | POA: Diagnosis not present

## 2021-05-27 DIAGNOSIS — I77810 Thoracic aortic ectasia: Secondary | ICD-10-CM | POA: Diagnosis not present

## 2021-05-27 DIAGNOSIS — I10 Essential (primary) hypertension: Secondary | ICD-10-CM

## 2021-05-27 NOTE — Progress Notes (Signed)
05/27/2021 4:08 PM Pre procedure call completed. Pt. Updated on date and time of arrival 05/31/21 at 0645. Times for NPO at MN and clear liquid consumption until 0400 reviewed. Pt. Allergies, medical hx and medication list reviewed. Medications ok to take day of surgery and those to hold prior to surgery reviewed. Pt. States he plans on not taking any medications day of procedure. Cardiac Clearance noted on chart from 05/27/21 with Dr. Kateri Mc. Ride secured for day of surgery (Wife [facility]). Questions and concerns addressed by patient. Pt. Verbalized understanding of all instructions.  Sydne Krahl, Blanchard Kelch

## 2021-05-27 NOTE — Patient Instructions (Addendum)
°  Medication Instructions:  Your physician recommends that you continue on your current medications as directed. Please refer to the Current Medication list given to you today.   *If you need a refill on your cardiac medications before your next appointment, please call your pharmacy*   Lab Work: NONE ordered at this time of appointment   If you have labs (blood work) drawn today and your tests are completely normal, you will receive your results only by: MyChart Message (if you have MyChart) OR A paper copy in the mail If you have any lab test that is abnormal or we need to change your treatment, we will call you to review the results.   Testing/Procedures: NONE ordered at this time of appointment     Follow-Up: At [facility], you and your health needs are our priority.  As part of our continuing mission to provide you with exceptional heart care, we have created designated Provider Care Teams.  These Care Teams include your primary Cardiologist (physician) and Advanced Practice Providers (APPs -  Physician Assistants and Nurse Practitioners) who all work together to provide you with the care you need, when you need it.  We recommend signing up for the patient portal called "MyChart".  Sign up information is provided on this After Visit Summary.  MyChart is used to connect with patients for Virtual Visits (Telemedicine).  Patients are able to view lab/test results, encounter notes, upcoming appointments, etc.  Non-urgent messages can be sent to your provider as well.   To learn more about what you can do with MyChart, go to [email or web address].    Your next appointment:   1 year(s)  The format for your next appointment:   In Person  Provider:   APP or Thurmon Fair, MD     Other Instructions None

## 2021-05-28 NOTE — H&P (Signed)
Office Visit Report     05/05/2021   --------------------------------------------------------------------------------   Luis Mccann  MRN: [id number]  DOB: 29-Apr-1956, 66 year old Male  SSN: [id number]   PRIMARY CARE:  Luis Lass, MD  REFERRING:  Luis Gravel, NP  PROVIDER:  Raynelle Mccann, M.D.  TREATING:  Luis Gravel, NP  LOCATION:  [facility] [phone]     --------------------------------------------------------------------------------   CC/HPI: 1. Urolithiasis  2. Elevated PSA   Luis Mccann returns today for further routine evaluation of his urolithiasis and elevated PSA. He continues to void well without subjectively bothersome symptoms. He denies any hematuria, flank pain, or passage of any stones. He did undergo a CT scan last winter after shockwave lithotripsy due to concern for persistent ureteral calculi but was found to have resolution of any obstructing ureteral stones. He follows up today for further evaluation of his PSA. He did undergo a biopsy that was negative in 2015. Although his PSA in December was similar to some of his prior results and he has had a fluctuating PSA, it was recommended that he follow up today to recheck this due to the potential for an increasing trend.   04/28/2021: 66 year old male who presents today due to urinary frequency and urgency. He reports this began about 2 weeks ago and is progressively worsened. He denies dysuria, gross hematuria, incontinence. He does have a history of urolithiasis. Urinalysis shows concerns for microscopic hematuria but no bacteria.   05/05/2021: 66 year old male who presents today for a 1 week follow-up after a sudden onset of urinary frequency and urgency associated with urethral discomfort. His KUB at last office visit was inconclusive. We decided to do a trial of tamsulosin to see if this would help his symptoms. He does have chronic back pain and does take daily pain medication. He reports that his  lower back has been uncomfortable and he has had some lower abdominal discomfort as well but this comes and goes. He denies fevers and chills. Urine culture was negative for bacterial growth.     ALLERGIES: No Allergies    MEDICATIONS: Tamsulosin Hcl 0.4 mg capsule 1 capsule PO Q HS  Acetaminophen  AmLODIPine Besylate 10 MG Oral Tablet Oral  Iron 325 (65 Fe) MG Oral Tablet Oral  Olmesartan Medoxomil  Oxycodone Hcl  Spironolactone 25 mg tablet     GU PSH: Cysto Uretero Lithotripsy - 2011 ESWL, Left - 01/27/2020, 2015 Locm 300-399Mg /Ml Iodine,1Ml - 02/03/2020       PSH Notes: Lithotripsy, Upper Gastrointestinal Endoscopy (Therapeutic), Cystoscopy With Ureteroscopy With Lithotripsy, Rotator Cuff Repair, Primary Repair Of Ruptured Achilles Tendon, Total Hip Replacement   NON-GU PSH: Repair Achilles Tendon - 2008 Total Hip Replacement - 2008     GU PMH: Microscopic hematuria (Stable) - 04/28/2021, - 04/24/2019 Renal calculus - 04/28/2021, - 11/25/2020, - 05/05/2020, - 04/22/2020, - 02/19/2020 (Stable), Patient with bilateral renal calculi but there is a large 15 mm wide renal collecting system calculi on the left side noted on today's exam. Easily seen on CT scout imaging., - 01/14/2020, Nephrolithiasis, - 2017 Urinary Frequency - 04/28/2021 Elevated PSA - 11/25/2020, - 04/22/2020, - 02/19/2020, Elevated prostate specific antigen (PSA), - 2017 Ureteral calculus - 05/05/2020, - 04/22/2020, Calculus of ureter, - 2014 Renal cyst - 02/03/2020, There is a large right renal cyst on the upper pole that is of indeterminate in characteristics based on today's non-contrasted study. Final radiological interpretation is pending. This may need to be further evaluated with contrasted CT  study or MRI of the abdomen. This was noted to be around 3cm in size 10 years ago but has increased by at least 2-3 cm on today's study., - 01/14/2020 Flank Pain - 01/14/2020 Gross hematuria - 01/14/2020 Hematospermia -  12/17/2018 Prostate nodule w/o LUTS, Prostate nodule - 2016 Encounter for Prostate Cancer screening, Prostate cancer screening - 2014 Neoplasm of unspecified behavior of unspecified kidney, Renal neoplasm - 2014      PMH Notes:   1) Urolithiasis: He has been treated with ESWL multiple times in the past prior to being under my care. He has known hypercalciuria and mild hyperoxaluria.   Medical treatment: HCTZ with antihypertensive regimen, dietary changes   Aug 2011: Left ureteroscopic laser lithotripsy (calcium oxalate dihydrate)  Apr 2015: ESWL of 6 mm left renal calculus  Aug 2015: 24 hr urine - low urine volume  Sep 2021: ESWL of left UPJ calculus  Jan 2022: CT scan - bilateral renal calculi with no residual ureteral stones   2) Complex right renal cyst: He has a Bosniak II right renal cyst off the superior aspect of the kidney which has been stable on surveillance imaging.   3) Prostate nodule/elevated PSA: He was found to have a rising PSA and new 1 cm left apical prostate nodule in January 2015.   Feb 2015: 12 core biopsy - negative except for HGPIN at left base , vol 74.9 cc     NON-GU PMH: Hypercalciuria, Hypercalciuria - 2015 Personal history of diseases of the blood and blood-forming organs and certain disorders involving the immune mechanism, History of anemia - 2014 Atrial Fibrillation Hypertension    FAMILY HISTORY: Cancer - Sister   SOCIAL HISTORY: Marital Status: Married Preferred Language: English; Ethnicity: Not Hispanic Or Latino; Race: White Current Smoking Status: Patient does not smoke anymore. Has not smoked since 05/02/1986.   Tobacco Use Assessment Completed: Used Tobacco in last 30 days? Does not drink anymore.  Drinks 1 caffeinated drink per day.     Notes: Occupation:, Marital History - Currently Married, Tobacco Use   REVIEW OF SYSTEMS:    GU Review Male:   Patient reports frequent urination and burning/ pain with urination. Patient denies hard to  postpone urination, get up at night to urinate, leakage of urine, stream starts and stops, trouble starting your stream, have to strain to urinate , erection problems, and penile pain.  Gastrointestinal (Upper):   Patient denies vomiting, indigestion/ heartburn, and nausea.  Gastrointestinal (Lower):   Patient denies diarrhea and constipation.  Constitutional:   Patient denies fever, night sweats, weight loss, and fatigue.  Musculoskeletal:   Patient denies back pain and joint pain.  Neurological:   Patient denies headaches and dizziness.  Psychologic:   Patient denies depression and anxiety.   Notes: weak stream    VITAL SIGNS:      05/05/2021 01:49 PM  Weight 195 lb / 88.45 kg  Height 71 in / 180.34 cm  BP 117/77 mmHg  Pulse 63 /min  Temperature 98.3 F / 36.8 C  BMI 27.2 kg/m   GU PHYSICAL EXAMINATION:      Notes: No CVA tenderness   MULTI-SYSTEM PHYSICAL EXAMINATION:    Constitutional: Well-nourished. No physical deformities. Normally developed. Good grooming.  Cardiovascular: Normal temperature, normal extremity pulses, no swelling, no varicosities.  Skin: No paleness, no jaundice, no cyanosis. No lesion, no ulcer, no rash.  Neurologic / Psychiatric: Oriented to time, oriented to place, oriented to person. No depression, no anxiety, no agitation.  Gastrointestinal: No mass, no tenderness, no rigidity, non obese abdomen.     Complexity of Data:  Source Of History:  Patient  Records Review:   Previous Doctor Records, Previous Patient Records  Urine Test Review:   Urinalysis, Urine Culture  X-Ray Review: KUB: Reviewed Films. Discussed With Patient.  C.T. Abdomen/Pelvis: Reviewed Films. Reviewed Report.     11/18/20 04/15/20 04/18/19 04/11/18 01/04/17 05/17/16 05/14/15 05/14/15  PSA  Total PSA 5.22 ng/mL 5.78 ng/mL 4.09 ng/mL 2.65 ng/mL 4.01 ng/mL 5.14 ng/dl 3.23  3.23   Free PSA 0.99 ng/mL 0.85 ng/mL 0.56 ng/mL  0.71 ng/mL 0.75 ng/dl    % Free PSA 19 % PSA 15 % PSA 14 %  PSA  18 % PSA 15 %      05/05/21  Urinalysis  Urine Appearance Clear   Urine Color Yellow   Urine Glucose Neg mg/dL  Urine Bilirubin Neg mg/dL  Urine Ketones Neg mg/dL  Urine Specific Gravity 1.025   Urine Blood Neg ery/uL  Urine pH 6.0   Urine Protein 1+ mg/dL  Urine Urobilinogen 1.0 mg/dL  Urine Nitrites Neg   Urine Leukocyte Esterase Trace leu/uL  Urine WBC/hpf 6 - 10/hpf   Urine RBC/hpf 10 - 20/hpf   Urine Epithelial Cells 0 - 5/hpf   Urine Bacteria Rare (0-9/hpf)   Urine Mucous Not Present   Urine Yeast NS (Not Seen)   Urine Trichomonas Not Present   Urine Cystals NS (Not Seen)   Urine Casts NS (Not Seen)   Urine Sperm Not Present    PROCEDURES:         C.T. Urogram - [id number]      Patient confirmed No Neulasta OnPro Device.         Urinalysis w/Scope Dipstick Dipstick Cont'd Micro  Color: Yellow Bilirubin: Neg mg/dL WBC/hpf: 6 - 10/hpf  Appearance: Clear Ketones: Neg mg/dL RBC/hpf: 10 - 20/hpf  Specific Gravity: 1.025 Blood: Neg ery/uL Bacteria: Rare (0-9/hpf)  pH: 6.0 Protein: 1+ mg/dL Cystals: NS (Not Seen)  Glucose: Neg mg/dL Urobilinogen: 1.0 mg/dL Casts: NS (Not Seen)    Nitrites: Neg Trichomonas: Not Present    Leukocyte Esterase: Trace leu/uL Mucous: Not Present      Epithelial Cells: 0 - 5/hpf      Yeast: NS (Not Seen)      Sperm: Not Present    ASSESSMENT:      ICD-10 Details  1 GU:   Ureteral calculus - N20.1 Left, Acute, Uncomplicated  2   Urinary Frequency - A999333 Acute, Uncomplicated   PLAN:           Orders Labs CULTURE, URINE  X-Rays: [facility] Protocol Without I.V. Contrast  X-Ray Notes: History:  Hematuria: Yes/No  Patient to see MD after exam: Yes/No  Previous exam: CT / IVP/ US/ KUB/ None  When:  Where:  Diabetic: Yes/ No  BUN/ Creatinine:  Date of last BUN Creatinine:  Weight in pounds:  Allergy- IV Contrast: Yes/ No  Conflicting diabetic meds: Yes/ No  Diabetic Meds:  Prior Authorization #: [employer] [id number]           Schedule         Document Letter(s):  Created for Patient: Clinical Summary         Notes:   NoUrinalysis with continued microscopic hematuria. CT imaging shows a left distal stone measuring approximately 9 mm with hydroureter. Final radiology read is pending. This is seen on scout imaging, however he does have some pelvic calcifications as  well that make it difficult to pinpoint exact location. He has required ureteroscopy before for a distal stone of this size. I will follow-up with his urologist on his recommendations for definitive stone management. Urinalysis will be sent for precautionary culture today. Stone intervention was discussed in detail today. For ureteroscopy, the patient understands that there is a chance for a staged procedure. Patient also understands that there is risk for bleeding, infection, injury to surrounding organs, and general risks of anesthesia. The patient also understands the placement of a stent and the risks of stent placement including, risk for infection, the risk for pain, and the risk for injury. For ESWL, the patient understands that there is a chance of failure of procedure, there is also a risk for bruising, infection, bleeding, and injury to surrounding structures. The patient verbalized understanding to these risks. He was given strict return precautions for any worsening symptomatology.        Next Appointment:      Next Appointment: 11/23/2021 01:30 PM    Appointment Type: Laboratory Appointment    Location: [facility] [phone]    Provider: Lab LAB    Reason for Visit: 1 year psa with reflex      * Signed by Luis Gravel, NP on 05/05/21 at 4:57 PM (EST)*

## 2021-05-31 ENCOUNTER — Encounter (HOSPITAL_BASED_OUTPATIENT_CLINIC_OR_DEPARTMENT_OTHER): Payer: Self-pay | Admitting: Urology

## 2021-05-31 ENCOUNTER — Ambulatory Visit (HOSPITAL_BASED_OUTPATIENT_CLINIC_OR_DEPARTMENT_OTHER)
Admission: RE | Admit: 2021-05-31 | Discharge: 2021-05-31 | Disposition: A | Discharge status: Home / Self Care | Payer: Medicare PPO | Source: Ambulatory Visit | Attending: Urology | Admitting: Urology

## 2021-05-31 ENCOUNTER — Encounter (HOSPITAL_BASED_OUTPATIENT_CLINIC_OR_DEPARTMENT_OTHER)
Admission: RE | Disposition: A | Discharge status: Home / Self Care | Payer: Self-pay | Source: Ambulatory Visit | Attending: Urology

## 2021-05-31 ENCOUNTER — Ambulatory Visit (HOSPITAL_COMMUNITY): Payer: Medicare PPO

## 2021-05-31 DIAGNOSIS — N200 Calculus of kidney: Secondary | ICD-10-CM | POA: Diagnosis not present

## 2021-05-31 DIAGNOSIS — I878 Other specified disorders of veins: Secondary | ICD-10-CM | POA: Diagnosis not present

## 2021-05-31 DIAGNOSIS — M47816 Spondylosis without myelopathy or radiculopathy, lumbar region: Secondary | ICD-10-CM | POA: Diagnosis not present

## 2021-05-31 DIAGNOSIS — G8929 Other chronic pain: Secondary | ICD-10-CM | POA: Insufficient documentation

## 2021-05-31 DIAGNOSIS — N201 Calculus of ureter: Secondary | ICD-10-CM

## 2021-05-31 DIAGNOSIS — Z79899 Other long term (current) drug therapy: Secondary | ICD-10-CM | POA: Insufficient documentation

## 2021-05-31 DIAGNOSIS — G4730 Sleep apnea, unspecified: Secondary | ICD-10-CM | POA: Diagnosis not present

## 2021-05-31 DIAGNOSIS — Z87442 Personal history of urinary calculi: Secondary | ICD-10-CM | POA: Insufficient documentation

## 2021-05-31 DIAGNOSIS — Z01818 Encounter for other preprocedural examination: Secondary | ICD-10-CM | POA: Diagnosis not present

## 2021-05-31 DIAGNOSIS — I10 Essential (primary) hypertension: Secondary | ICD-10-CM | POA: Insufficient documentation

## 2021-05-31 HISTORY — PX: EXTRACORPOREAL SHOCK WAVE LITHOTRIPSY: SHX1557

## 2021-05-31 SURGERY — LITHOTRIPSY, ESWL
Anesthesia: LOCAL | Laterality: Left

## 2021-05-31 MED ORDER — CIPROFLOXACIN HCL 500 MG PO TABS
ORAL_TABLET | ORAL | Status: AC
  Filled 2021-05-31: qty 1

## 2021-05-31 MED ORDER — DIAZEPAM 5 MG PO TABS
10.0000 mg | ORAL_TABLET | ORAL | Status: AC
  Administered 2021-05-31: 10 mg via ORAL

## 2021-05-31 MED ORDER — CIPROFLOXACIN HCL 500 MG PO TABS
500.0000 mg | ORAL_TABLET | ORAL | Status: AC
  Administered 2021-05-31: 500 mg via ORAL

## 2021-05-31 MED ORDER — DIPHENHYDRAMINE HCL 25 MG PO CAPS
ORAL_CAPSULE | ORAL | Status: AC
  Filled 2021-05-31: qty 1

## 2021-05-31 MED ORDER — DIAZEPAM 5 MG PO TABS
ORAL_TABLET | ORAL | Status: AC
  Filled 2021-05-31: qty 2

## 2021-05-31 MED ORDER — DIPHENHYDRAMINE HCL 25 MG PO CAPS
25.0000 mg | ORAL_CAPSULE | ORAL | Status: AC
  Administered 2021-05-31: 25 mg via ORAL

## 2021-05-31 MED ORDER — SODIUM CHLORIDE 0.9 % IV SOLN: INTRAVENOUS | Status: DC

## 2021-05-31 NOTE — Interval H&P Note (Signed)
History and Physical Interval Note:  05/31/2021 7:44 AM  [name]  has presented today for surgery, with the diagnosis of LEFT DISTAL CALCULUS.  The various methods of treatment have been discussed with the patient and family. After consideration of risks, benefits and other options for treatment, the patient has consented to  Procedure(s): EXTRACORPOREAL SHOCK WAVE LITHOTRIPSY (ESWL) (Left) as a surgical intervention.  The patient's history has been reviewed, patient examined, no change in status, stable for surgery.  I have reviewed the patient's chart and labs.  Questions were answered to the patient's satisfaction.     [employer]

## 2021-05-31 NOTE — Op Note (Signed)
See [facility] operative note scanned into chart. Also because of the size, density, location and other factors that cannot be anticipated I feel this will likely be a staged procedure. This fact supersedes any indication in the scanned [facility] operative note to the contrary.  

## 2021-05-31 NOTE — Discharge Instructions (Addendum)
1. You should strain your urine and collect all fragments and bring them to your follow up appointment.  2. You should take your pain medication as needed.  Please call if your pain is severe to the point that it is not controlled with your pain medication. 3. You should call if you develop fever > 101 or persistent nausea or vomiting. 4. Your doctor may prescribe tamsulosin to take to help facilitate stone passage.  Post Anesthesia Home Care Instructions  Activity: Get plenty of rest for the remainder of the day. A responsible adult should stay with you for 24 hours following the procedure.  For the next 24 hours, DO NOT: -Drive a car -Operate machinery -Drink alcoholic beverages -Take any medication unless instructed by your physician -Make any legal decisions or sign important papers.  Meals: Start with liquid foods such as gelatin or soup. Progress to regular foods as tolerated. Avoid greasy, spicy, heavy foods. If nausea and/or vomiting occur, drink only clear liquids until the nausea and/or vomiting subsides. Call your physician if vomiting continues.  Special Instructions/Symptoms: Your throat may feel dry or sore from the anesthesia or the breathing tube placed in your throat during surgery. If this causes discomfort, gargle with warm salt water. The discomfort should disappear within 24 hours.  If you had a scopolamine patch placed behind your ear for the management of post- operative nausea and/or vomiting:  1. The medication in the patch is effective for 72 hours, after which it should be removed.  Wrap patch in a tissue and discard in the trash. Wash hands thoroughly with soap and water. 2. You may remove the patch earlier than 72 hours if you experience unpleasant side effects which may include dry mouth, dizziness or visual disturbances. 3. Avoid touching the patch. Wash your hands with soap and water after contact with the patch.    

## 2021-06-01 ENCOUNTER — Ambulatory Visit (INDEPENDENT_AMBULATORY_CARE_PROVIDER_SITE_OTHER): Payer: Medicare PPO | Admitting: Family Medicine

## 2021-06-01 ENCOUNTER — Encounter (HOSPITAL_BASED_OUTPATIENT_CLINIC_OR_DEPARTMENT_OTHER): Payer: Self-pay | Admitting: Urology

## 2021-06-18 DIAGNOSIS — N201 Calculus of ureter: Secondary | ICD-10-CM | POA: Diagnosis not present

## 2021-06-28 ENCOUNTER — Ambulatory Visit (INDEPENDENT_AMBULATORY_CARE_PROVIDER_SITE_OTHER): Payer: Medicare PPO | Admitting: Family Medicine

## 2021-07-26 ENCOUNTER — Other Ambulatory Visit: Payer: Self-pay | Admitting: Urology

## 2021-08-23 DIAGNOSIS — N200 Calculus of kidney: Secondary | ICD-10-CM | POA: Diagnosis not present

## 2021-08-23 NOTE — Progress Notes (Signed)
Talked with patient. Instructions given. Arrival time 0800. Clear liquids until 0600. Wife is the driver. States has not had any more episodes with A-fib since 2000. Did have clearance in Jan. 2023 this yr for litho. Meds and hx reviewed. ?

## 2021-08-24 ENCOUNTER — Ambulatory Visit: Payer: Medicare PPO | Admitting: Cardiovascular Disease

## 2021-08-26 ENCOUNTER — Ambulatory Visit (HOSPITAL_COMMUNITY): Payer: Medicare PPO

## 2021-08-26 ENCOUNTER — Ambulatory Visit (HOSPITAL_BASED_OUTPATIENT_CLINIC_OR_DEPARTMENT_OTHER)
Admission: RE | Admit: 2021-08-26 | Discharge: 2021-08-26 | Disposition: A | Discharge status: Home / Self Care | Payer: Medicare PPO | Attending: Urology | Admitting: Urology

## 2021-08-26 ENCOUNTER — Encounter (HOSPITAL_BASED_OUTPATIENT_CLINIC_OR_DEPARTMENT_OTHER)
Admission: RE | Disposition: A | Discharge status: Home / Self Care | Payer: Self-pay | Source: Home / Self Care | Attending: Urology

## 2021-08-26 ENCOUNTER — Encounter (HOSPITAL_BASED_OUTPATIENT_CLINIC_OR_DEPARTMENT_OTHER): Payer: Self-pay | Admitting: Urology

## 2021-08-26 ENCOUNTER — Other Ambulatory Visit: Payer: Self-pay

## 2021-08-26 DIAGNOSIS — N200 Calculus of kidney: Secondary | ICD-10-CM | POA: Diagnosis not present

## 2021-08-26 HISTORY — PX: EXTRACORPOREAL SHOCK WAVE LITHOTRIPSY: SHX1557

## 2021-08-26 SURGERY — LITHOTRIPSY, ESWL
Anesthesia: LOCAL | Laterality: Right

## 2021-08-26 MED ORDER — DIPHENHYDRAMINE HCL 25 MG PO CAPS
ORAL_CAPSULE | ORAL | Status: AC
  Filled 2021-08-26: qty 1

## 2021-08-26 MED ORDER — DIPHENHYDRAMINE HCL 25 MG PO CAPS
25.0000 mg | ORAL_CAPSULE | ORAL | Status: AC
  Administered 2021-08-26: 25 mg via ORAL

## 2021-08-26 MED ORDER — DIAZEPAM 5 MG PO TABS
ORAL_TABLET | ORAL | Status: AC
  Filled 2021-08-26: qty 2

## 2021-08-26 MED ORDER — SODIUM CHLORIDE 0.9 % IV SOLN: INTRAVENOUS | Status: DC

## 2021-08-26 MED ORDER — DIAZEPAM 5 MG PO TABS
10.0000 mg | ORAL_TABLET | ORAL | Status: AC
  Administered 2021-08-26: 10 mg via ORAL

## 2021-08-26 MED ORDER — CIPROFLOXACIN HCL 500 MG PO TABS
ORAL_TABLET | ORAL | Status: AC
  Filled 2021-08-26: qty 1

## 2021-08-26 MED ORDER — CIPROFLOXACIN HCL 500 MG PO TABS
500.0000 mg | ORAL_TABLET | ORAL | Status: AC
  Administered 2021-08-26: 500 mg via ORAL

## 2021-08-26 NOTE — H&P (Signed)
See HP scanned into Epic 

## 2021-08-26 NOTE — Op Note (Signed)
See [facility] OP note scanned into chart. Also because of the size, density, location and other factors that cannot be anticipated I feel this will likely be a staged procedure. This fact supersedes any indication in the scanned [facility] operative note to the contrary.  

## 2021-08-26 NOTE — Discharge Instructions (Signed)
See [facility] discharge instructions in chart.  

## 2021-08-30 ENCOUNTER — Encounter (HOSPITAL_BASED_OUTPATIENT_CLINIC_OR_DEPARTMENT_OTHER): Payer: Self-pay | Admitting: Urology

## 2021-09-02 ENCOUNTER — Ambulatory Visit: Payer: Medicare PPO | Admitting: Cardiovascular Disease

## 2021-09-03 ENCOUNTER — Telehealth: Payer: Self-pay | Admitting: Cardiovascular Disease

## 2021-09-03 ENCOUNTER — Ambulatory Visit: Payer: Medicare PPO | Admitting: Cardiovascular Disease

## 2021-09-03 ENCOUNTER — Encounter: Payer: Self-pay | Admitting: *Deleted

## 2021-09-03 ENCOUNTER — Encounter: Payer: Self-pay | Admitting: Cardiovascular Disease

## 2021-09-03 VITALS — BP 100/60 | HR 49 | Ht 69.0 in | Wt 206.4 lb

## 2021-09-03 DIAGNOSIS — I10 Essential (primary) hypertension: Secondary | ICD-10-CM | POA: Diagnosis not present

## 2021-09-03 DIAGNOSIS — I77810 Thoracic aortic ectasia: Secondary | ICD-10-CM

## 2021-09-03 DIAGNOSIS — Z8679 Personal history of other diseases of the circulatory system: Secondary | ICD-10-CM

## 2021-09-03 DIAGNOSIS — G4733 Obstructive sleep apnea (adult) (pediatric): Secondary | ICD-10-CM | POA: Diagnosis not present

## 2021-09-03 DIAGNOSIS — E663 Overweight: Secondary | ICD-10-CM

## 2021-09-03 MED ORDER — AMLODIPINE BESYLATE 5 MG PO TABS: 5.0000 mg | ORAL_TABLET | Freq: Every day | ORAL | 3 refills | Status: DC

## 2021-09-03 NOTE — Telephone Encounter (Signed)
Patient calling to make sure the lab order he requested will be sent to [facility]. He says he can get a call or mychart message confirming it's been sent. ?

## 2021-09-03 NOTE — Telephone Encounter (Signed)
MyChart message sent as was discussed at the office visit.  ?

## 2021-09-03 NOTE — Patient Instructions (Signed)
Medication Instructions:  ?DECREASE the Amlodipine to 5 mg once daily ? ?*If you need a refill on your cardiac medications before your next appointment, please call your pharmacy* ? ? ?Lab Work: ?None ordered ?If you have labs (blood work) drawn today and your tests are completely normal, you will receive your results only by: ?MyChart Message (if you have MyChart) OR ?A paper copy in the mail ?If you have any lab test that is abnormal or we need to change your treatment, we will call you to review the results. ? ? ?Testing/Procedures: ?None ordered ? ? ?Follow-Up: ?At [facility], you and your health needs are our priority.  As part of our continuing mission to provide you with exceptional heart care, we have created designated Provider Care Teams.  These Care Teams include your primary Cardiologist (physician) and Advanced Practice Providers (APPs -  Physician Assistants and Nurse Practitioners) who all work together to provide you with the care you need, when you need it. ? ?We recommend signing up for the patient portal called "MyChart".  Sign up information is provided on this After Visit Summary.  MyChart is used to connect with patients for Virtual Visits (Telemedicine).  Patients are able to view lab/test results, encounter notes, upcoming appointments, etc.  Non-urgent messages can be sent to your provider as well.   ?To learn more about what you can do with MyChart, go to [email or web address].   ? ?Your next appointment:   ?12 month(s) ? ?The format for your next appointment:   ?In Person ? ?Provider:   ?Sanda Klein, MD { ? ? ?Important Information About Sugar ? ? ? ? ? ? ?

## 2021-09-03 NOTE — Progress Notes (Signed)
? ?Cardiology Office Note   ? ?Date:  09/03/2021  ? ?ID:  [name], DOB 08-31-1955, MRN [id number] ? ?PCP:  Sigmund Hazel, MD  ?Cardiologist:   Thurmon Fair, MD  ? ?No chief complaint on file. ? ? ?History of Present Illness:  ?Luis Mccann is a 66 y.o. male with remote history of a single episode of atrial fibrillation, OSA on CPAP, essential hypertension, borderline obesity, but without known structural heart disease returns for follow-up. ? ?He has had longstanding problems with nephrolithiasis and required lithotripsy a few weeks ago.  He has not had any cardiovascular problems.  He managed to lose weight down to 192 pounds, but has gained quite a bit of it back.  He is working on losing weight again.  His BMI places him at the borderline obese range. ? ?His blood pressure today is borderline low at 100/60 and as always he is relatively bradycardic with heart rate in the 40s.  He does not have any dizziness lightheadedness and has not experienced fatigue or syncope.  He denies orthopnea, PND, edema, claudication or palpitations or focal neurological events. ? ?He has had some difficulty with his latest CPAP with nasal pillow device, which does not seem to seal as well as the old one.  He is waking up at night with a leak and finds that his sleep is not as restful as it was before.  He is however compliant with CPAP every night. ? ?He has a lot of joint problems.  He complains of pain in his right shoulder where he has had a previous subacromial resection rotator cuff repair, his right hip which has been replaced twice, lumbar spine where he has a known problem with L4, both knees.  Haemophilus L3-L4 discitis in October-November 2019.  No evidence of endocarditis by echocardiogram and blood cultures. ? ? ?Past Medical History:  ?Diagnosis Date  ? Arthritis   ? osteoarthritis  ? Back pain   ? Dysrhythmia 2000  ? single episode of afib with RVR with normal echo and rare atrial/ventricular ectopy on f/u Holter  [facility])  ? History of kidney stones   ? Hypertension   ? Joint pain   ? Obesity (BMI 30.0-34.9) 04/04/2013  ? Osteoarthritis   ? Pneumonia   ? hx of age 68  ? Severe obstructive sleep apnea   ? do not wear mask/  ? Sleep apnea   ? SOBOE (shortness of breath on exertion)   ? ? ?Past Surgical History:  ?Procedure Laterality Date  ? ACHILLES TENDON REPAIR Left 1987 and 1990  ? COLONOSCOPY    ? DOPPLER ECHOCARDIOGRAPHY  06/12/1998  ? normal doppler examination, normal overall LV systolic function  ? EXTRACORPOREAL SHOCK WAVE LITHOTRIPSY Left 01/27/2020  ? Procedure: EXTRACORPOREAL SHOCK WAVE LITHOTRIPSY (ESWL);  Surgeon: Heloise Purpura, MD;  [facility];  Service: Urology;  Laterality: Left;  ? EXTRACORPOREAL SHOCK WAVE LITHOTRIPSY Left 05/31/2021  ? Procedure: EXTRACORPOREAL SHOCK WAVE LITHOTRIPSY (ESWL);  Surgeon: Heloise Purpura, MD;  Location: [facility];  Service: Urology;  Laterality: Left;  ? EXTRACORPOREAL SHOCK WAVE LITHOTRIPSY Right 08/26/2021  ? Procedure: EXTRACORPOREAL SHOCK WAVE LITHOTRIPSY (ESWL);  Surgeon: Noel Christmas, MD;  Location: [facility];  Service: Urology;  Laterality: Right;  ? HIP SURGERY Right   ? arthoscopic x1, one other surgery   ? INCISION AND DRAINAGE HIP Right 09/24/2013  ? Procedure: RIGHT HIP BEARING SURFACE REVISION;  Surgeon: Loanne Drilling, MD;  Location: [facility];  Service: Orthopedics;  Laterality: Right;  ? JOINT REPLACEMENT  1995  ? right hip  ? KIDNEY STONE SURGERY    ? LITHOTRIPSY    ? x4  ? NASAL SEPTOPLASTY W/ TURBINOPLASTY Bilateral 01/02/2013  ? Procedure: NASAL SEPTOPLASTY WITH TURBINATE REDUCTION;  Surgeon: Flo Shanks, MD;  Location: [facility];  Service: ENT;  Laterality: Bilateral;  ? NASAL SEPTUM SURGERY  1980's  ? RENAL ARTERY DUPLEX  09/22/2004  ? abdominal aorta-normal taper with no suggestion of aneurysmal dilatation/diameter reduction. normal patency of right and left kidney arteries. right and left kidneys equal in  size, symmetrical in shape with normal cortex and medulla with normal resistance indices and normal echogencity with no evidence of hydronephrosis.  ? SHOULDER SURGERY Left 1990's  ? for burr  ? SHOULDER SURGERY Right 2010, 2012  ? abuttment and rotator cuff repair  ? SLEEP STUDY  10/25/2011  ? AHI during total sleep time - 88.39/hr, AHI during REM 75.00/hr  ? ? ?Current Medications: ?Outpatient Medications Prior to Visit  ?Medication Sig Dispense Refill  ? Ascorbic Acid (VITAMIN C) 1000 MG tablet Take 500 mg by mouth daily.    ? Ferrous Sulfate (IRON) 28 MG TABS Take 1 tablet by mouth. Monday Wednesday Friday    ? ibuprofen (ADVIL) 800 MG tablet 1 tablet with food or milk as needed    ? olmesartan-hydrochlorothiazide (BENICAR HCT) 40-25 MG tablet Take 1 tablet by mouth daily. 90 tablet 0  ? oxyCODONE-acetaminophen (PERCOCET/ROXICET) 5-325 MG tablet Take by mouth.    ? spironolactone (ALDACTONE) 25 MG tablet Take 1 tablet (25 mg total) by mouth daily. Keep upcoming appointment for future refills 90 tablet 0  ? amLODipine (NORVASC) 10 MG tablet Take 1 tablet (10 mg total) by mouth daily. 90 tablet 0  ? tamsulosin (FLOMAX) 0.4 MG CAPS capsule Take 0.4 mg by mouth at bedtime. (Patient not taking: Reported on 09/03/2021)    ? ?No facility-administered medications prior to visit.  ?  ? ?Allergies:   Patient has no known allergies.  ? ?Social History  ? ?Socioeconomic History  ? Marital status: Married  ?  Spouse name: Mahir Prabhakar  ? Number of children: Not on file  ? Years of education: Not on file  ? Highest education level: Not on file  ?Occupational History  ? Occupation: IT trainer, [employer]  ?Tobacco Use  ? Smoking status: Former  ?  Types: Cigarettes  ? Smokeless tobacco: Never  ? Tobacco comments:  ?  quit 30 years ago-social smoker  ?Vaping Use  ? Vaping Use: Never used  ?Substance and Sexual Activity  ? Alcohol use: Yes  ?  Comment: occasional  ? Drug use: No  ? Sexual activity: Not on file  ?Other Topics Concern  ?  Not on file  ?Social History Narrative  ? Not on file  ? ?Social Determinants of Health  ? ?Financial Resource Strain: Not on file  ?Food Insecurity: Not on file  ?Transportation Needs: Not on file  ?Physical Activity: Not on file  ?Stress: Not on file  ?Social Connections: Not on file  ?  ? ?Family History:  The patient's family history includes Cancer (age of onset: 52) in his sister; Hypertension in his mother; Stroke in his mother.  ? ?ROS:   ?Please see the history of present illness.    ?ROS all other systems are reviewed and are negative ? ? ?PHYSICAL EXAM:   ?VS:  BP 100/60   Pulse Marland Kitchen)  49   Ht 5\' 9"  (1.753 m)   Wt 206 lb 6.4 oz (93.6 kg)   SpO2 97%   BMI 30.48 kg/m?    ? ? ? ?General: Alert, oriented x3, no distress, borderline obese ?Head: no evidence of trauma, PERRL, EOMI, no exophtalmos or lid lag, no myxedema, no xanthelasma; normal ears, nose and oropharynx ?Neck: normal jugular venous pulsations and no hepatojugular reflux; brisk carotid pulses without delay and no carotid bruits ?Chest: clear to auscultation, no signs of consolidation by percussion or palpation, normal fremitus, symmetrical and full respiratory excursions ?Cardiovascular: normal position and quality of the apical impulse, regular rhythm, normal first and second heart sounds, 1-2/6 early peaking aortic ejection murmur unchanged, no diastolic murmurs, rubs or gallops ?Abdomen: no tenderness or distention, no masses by palpation, no abnormal pulsatility or arterial bruits, normal bowel sounds, no hepatosplenomegaly ?Extremities: no clubbing, cyanosis or edema; 2+ radial, ulnar and brachial pulses bilaterally; 2+ right femoral, posterior tibial and dorsalis pedis pulses; 2+ left femoral, posterior tibial and dorsalis pedis pulses; no subclavian or femoral bruits ?Neurological: grossly nonfocal ?Psych: Normal mood and affect ? ? ? ?Wt Readings from Last 3 Encounters:  ?09/03/21 206 lb 6.4 oz (93.6 kg)  ?08/26/21 202 lb 11.2 oz (91.9  kg)  ?05/31/21 195 lb 6.4 oz (88.6 kg)  ?  ? ? ?Studies/Labs Reviewed:  ? ?EKG:  EKG is not ordered today.  Personally reviewed the tracing from 05/27/2021 which shows sinus bradycardia, left axis deviation

## 2021-09-07 ENCOUNTER — Telehealth: Payer: Self-pay | Admitting: Cardiovascular Disease

## 2021-09-07 NOTE — Telephone Encounter (Signed)
Yes, please refer. I wanted to do it at clinic appt, but he hesitated then. thanks ?

## 2021-09-07 NOTE — Telephone Encounter (Signed)
Pt states that he would like to know if Dr. Royann Shivers would write him a referral to see Dr. Tresa Endo for his sleep and CPAP issues. Pt also states that he was not able to sleep well last night due to CPAP mask being too loose. Pt says that he woke up sweating really bad. Please advise ?

## 2021-09-08 ENCOUNTER — Other Ambulatory Visit: Payer: Self-pay

## 2021-09-08 DIAGNOSIS — G4733 Obstructive sleep apnea (adult) (pediatric): Secondary | ICD-10-CM

## 2021-09-08 NOTE — Telephone Encounter (Signed)
Referral to sleep studies was ordered.  ? ?Scheduling team- please place on Dr.Kelly's schedule for a sleep patient.  ?Thank you!  ? ?

## 2021-09-10 DIAGNOSIS — Z136 Encounter for screening for cardiovascular disorders: Secondary | ICD-10-CM | POA: Diagnosis not present

## 2021-09-10 DIAGNOSIS — I10 Essential (primary) hypertension: Secondary | ICD-10-CM | POA: Diagnosis not present

## 2021-09-10 DIAGNOSIS — Z0000 Encounter for general adult medical examination without abnormal findings: Secondary | ICD-10-CM | POA: Diagnosis not present

## 2021-09-23 DIAGNOSIS — N200 Calculus of kidney: Secondary | ICD-10-CM | POA: Diagnosis not present

## 2021-09-28 ENCOUNTER — Other Ambulatory Visit: Payer: Self-pay | Admitting: Cardiovascular Disease

## 2021-10-22 DIAGNOSIS — G894 Chronic pain syndrome: Secondary | ICD-10-CM | POA: Diagnosis not present

## 2021-11-23 DIAGNOSIS — R972 Elevated prostate specific antigen [PSA]: Secondary | ICD-10-CM | POA: Diagnosis not present

## 2021-12-01 DIAGNOSIS — N200 Calculus of kidney: Secondary | ICD-10-CM | POA: Diagnosis not present

## 2021-12-01 DIAGNOSIS — R972 Elevated prostate specific antigen [PSA]: Secondary | ICD-10-CM | POA: Diagnosis not present

## 2021-12-08 ENCOUNTER — Encounter (INDEPENDENT_AMBULATORY_CARE_PROVIDER_SITE_OTHER): Payer: Self-pay

## 2021-12-18 DIAGNOSIS — N200 Calculus of kidney: Secondary | ICD-10-CM | POA: Diagnosis not present

## 2022-02-04 DIAGNOSIS — M7711 Lateral epicondylitis, right elbow: Secondary | ICD-10-CM | POA: Diagnosis not present

## 2022-02-04 DIAGNOSIS — M25521 Pain in right elbow: Secondary | ICD-10-CM | POA: Diagnosis not present

## 2022-02-10 ENCOUNTER — Other Ambulatory Visit: Payer: Self-pay | Admitting: Cardiovascular Disease

## 2022-02-10 ENCOUNTER — Telehealth: Payer: Self-pay | Admitting: Cardiovascular Disease

## 2022-02-10 MED ORDER — OLMESARTAN MEDOXOMIL-HCTZ 40-25 MG PO TABS: 1.0000 | ORAL_TABLET | Freq: Every day | ORAL | 3 refills | Status: DC

## 2022-02-10 NOTE — Telephone Encounter (Signed)
*  STAT* If patient is at the pharmacy, call can be transferred to refill team.   1. Which medications need to be refilled? (please list name of each medication and dose if known) Olmesartan  2. Which pharmacy/location (including street and city if local pharmacy) is medication to be sent to? [facility]  3. Do they need a 30 day or 90 day supply? Only received it for 30 days, he needs it for 90 days and refills- he just saw Dr C in May 2023

## 2022-02-10 NOTE — Addendum Note (Signed)
Addended by: Lubertha Sayres on: 02/10/2022 11:37 AM   Modules accepted: Orders

## 2022-02-10 NOTE — Telephone Encounter (Signed)
Patient called back stating the refill for olmesartan-hydrochlorothiazide (BENICAR HCT) 40-25 MG tablet should have been sent to [facility], [place].

## 2022-02-14 DIAGNOSIS — M5136 Other intervertebral disc degeneration, lumbar region: Secondary | ICD-10-CM | POA: Diagnosis not present

## 2022-02-14 DIAGNOSIS — Z5181 Encounter for therapeutic drug level monitoring: Secondary | ICD-10-CM | POA: Diagnosis not present

## 2022-02-14 DIAGNOSIS — Z79899 Other long term (current) drug therapy: Secondary | ICD-10-CM | POA: Diagnosis not present

## 2022-02-14 DIAGNOSIS — M7062 Trochanteric bursitis, left hip: Secondary | ICD-10-CM | POA: Diagnosis not present

## 2022-02-14 DIAGNOSIS — G894 Chronic pain syndrome: Secondary | ICD-10-CM | POA: Diagnosis not present

## 2022-02-14 DIAGNOSIS — M419 Scoliosis, unspecified: Secondary | ICD-10-CM | POA: Diagnosis not present

## 2022-02-24 ENCOUNTER — Telehealth: Payer: Self-pay | Admitting: Cardiovascular Disease

## 2022-02-24 NOTE — Telephone Encounter (Signed)
Patient called stating his CPAP pump is showing he needs to get a new CPAP pump.  Patient stated he will need a new prescription for this device.

## 2022-02-24 NOTE — Telephone Encounter (Signed)
Routed to sleep study team

## 2022-03-01 NOTE — Telephone Encounter (Signed)
Returned a call to the patient. He was informed that he will need appointment to see Dr Claiborne Billings. Malfunction of the machine has to be documented. Also he has not seen Dr Claiborne Billings for compliance visit in a number of years. Patient was notified that Dr Claiborne Billings has been added to the office tomorrow and can see him. Patient states that he will call back.

## 2022-03-02 ENCOUNTER — Encounter: Payer: Self-pay | Admitting: Cardiovascular Disease

## 2022-03-02 ENCOUNTER — Ambulatory Visit: Payer: Medicare PPO | Attending: Cardiovascular Disease | Admitting: Cardiovascular Disease

## 2022-03-02 DIAGNOSIS — Z8679 Personal history of other diseases of the circulatory system: Secondary | ICD-10-CM | POA: Diagnosis not present

## 2022-03-02 DIAGNOSIS — I480 Paroxysmal atrial fibrillation: Secondary | ICD-10-CM | POA: Diagnosis not present

## 2022-03-02 DIAGNOSIS — G4733 Obstructive sleep apnea (adult) (pediatric): Secondary | ICD-10-CM | POA: Diagnosis not present

## 2022-03-02 DIAGNOSIS — I10 Essential (primary) hypertension: Secondary | ICD-10-CM | POA: Diagnosis not present

## 2022-03-02 NOTE — Progress Notes (Signed)
Cardiology Office Note    Date:  03/02/2022   ID:  [name], DOB [birth year], MRN [id number]  PCP:  Kathyrn Lass, MD  Cardiologist:  Shelva Majestic, MD (sleep); Dr. Orene Desanctis  Sleep follow-up evaluation, last seen September 2015  History of Present Illness:  Luis Mccann is a 66 y.o. male who is followed by Dr. Sallyanne Kuster for cardiology care.  Has a history of paroxysmal atrial fibrillation, nephrolithiasis, and hypertension.  Remotely he had undergone nasal surgery by Dr. Erik Obey.  In 2015 he was referred for diagnostic polysomnogram and was found to have severe sleep apnea with an AHI of 86.4/h.  He did not achieve any REM sleep and oxygen desaturated to 84% with non-REM sleep.  His Epworth Sleepiness Scale score endorsed at 14.  He was started on CPAP therapy set up date on Sep 27, 2013.  I saw him for my only evaluation in September 2015 at which time he was compliant on CPAP therapy.  At the time he was only using CPAP for 5 hours and 9 minutes per night and we discussed the importance of optimal sleep duration at 7 and 9 hours.  His blood pressure was elevated  I have not seen him since his sleep evaluation in 2015.  He recently saw Dr. Sallyanne Kuster on Sep 03, 2021 he has a remote history of a single episode of atrial fibrillation.  He has continued to be on CPAP therapy but was recently having difficulty with his equipment.  He was trying to lose weight.  His blood pressure was controlled on 4 antihypertensives.  Presently, he is on amlodipine 5 mg daily, olmesartan HCT 40/25 mg, and spironolactone 25 mg daily for hypertension.  He brought his 2015 machine with him.  A download of October 3 through March 02, 2022 shows 80% usage days with average use at 5 hours and 29 minutes.  His pressure is set at a range of 4 to 20 cm with AHI of 1.6.  95th percentile pressure was 11.5 with maximum average pressure 12.9.  He presents for reevaluation and he is evaluation prior to obtaining a new  machine.   Past Medical History:  Diagnosis Date   Arthritis    osteoarthritis   Back pain    Dysrhythmia 2000   single episode of afib with RVR with normal echo and rare atrial/ventricular ectopy on f/u [facility])   History of kidney stones    Hypertension    Joint pain    Obesity (BMI 30.0-34.9) 04/04/2013   Osteoarthritis    Pneumonia    hx of age 73   Severe obstructive sleep apnea    do not wear mask/   Sleep apnea    SOBOE (shortness of breath on exertion)     Past Surgical History:  Procedure Laterality Date   ACHILLES TENDON REPAIR Left 1987 and 1990   COLONOSCOPY     DOPPLER ECHOCARDIOGRAPHY  06/12/1998   normal doppler examination, normal overall LV systolic function   EXTRACORPOREAL SHOCK WAVE LITHOTRIPSY Left 01/27/2020   Procedure: EXTRACORPOREAL SHOCK WAVE LITHOTRIPSY (ESWL);  Surgeon: Raynelle Bring, MD;  Location: [facility];  Service: Urology;  Laterality: Left;   EXTRACORPOREAL SHOCK WAVE LITHOTRIPSY Left 05/31/2021   Procedure: EXTRACORPOREAL SHOCK WAVE LITHOTRIPSY (ESWL);  Surgeon: Raynelle Bring, MD;  Location: [facility];  Service: Urology;  Laterality: Left;   EXTRACORPOREAL SHOCK WAVE LITHOTRIPSY Right 08/26/2021   Procedure: EXTRACORPOREAL SHOCK WAVE LITHOTRIPSY (ESWL);  Surgeon: Robley Fries, MD;  Location: [facility];  Service: Urology;  Laterality: Right;   HIP SURGERY Right    arthoscopic x1, one other surgery    INCISION AND DRAINAGE HIP Right 09/24/2013   Procedure: RIGHT HIP BEARING SURFACE REVISION;  Surgeon: Gearlean Alf, MD;  Location: [facility];  Service: Orthopedics;  Laterality: Right;   JOINT REPLACEMENT  1995   right hip   KIDNEY STONE SURGERY     LITHOTRIPSY     x4   NASAL SEPTOPLASTY W/ TURBINOPLASTY Bilateral 01/02/2013   Procedure: NASAL SEPTOPLASTY WITH TURBINATE REDUCTION;  Surgeon: Jodi Marble, MD;  Location: [facility];  Service: ENT;  Laterality: Bilateral;   NASAL SEPTUM SURGERY   1980's   RENAL ARTERY DUPLEX  09/22/2004   abdominal aorta-normal taper with no suggestion of aneurysmal dilatation/diameter reduction. normal patency of right and left kidney arteries. right and left kidneys equal in size, symmetrical in shape with normal cortex and medulla with normal resistance indices and normal echogencity with no evidence of hydronephrosis.   SHOULDER SURGERY Left 1990's   for burr   SHOULDER SURGERY Right 2010, 2012   abuttment and rotator cuff repair   SLEEP STUDY  10/25/2011   AHI during total sleep time - 88.39/hr, AHI during REM 75.00/hr    Current Medications: Outpatient Medications Prior to Visit  Medication Sig Dispense Refill   amLODipine (NORVASC) 5 MG tablet Take 1 tablet (5 mg total) by mouth daily. 90 tablet 3   Ascorbic Acid (VITAMIN C) 1000 MG tablet Take 500 mg by mouth daily.     Ferrous Sulfate (IRON) 28 MG TABS Take 1 tablet by mouth. Monday Wednesday Friday     ibuprofen (ADVIL) 800 MG tablet 1 tablet with food or milk as needed     Multiple Vitamin (MULTIVITAMIN ADULT PO) Take by mouth.     olmesartan-hydrochlorothiazide (BENICAR HCT) 40-25 MG tablet Take 1 tablet by mouth daily. 90 tablet 3   oxyCODONE-acetaminophen (PERCOCET/ROXICET) 5-325 MG tablet Take by mouth.     spironolactone (ALDACTONE) 25 MG tablet TAKE 1 TABLET EVERY DAY. KEEP UPCOMING APPOINTMENT FOR FUTURE REFILLS 90 tablet 0   tamsulosin (FLOMAX) 0.4 MG CAPS capsule Take 0.4 mg by mouth at bedtime. (Patient not taking: Reported on 03/02/2022)     No facility-administered medications prior to visit.     Allergies:   Patient has no known allergies.   Social History   Socioeconomic History   Marital status: Married    Spouse name: Aldrich Lloyd   Number of children: Not on file   Years of education: Not on file   Highest education level: Not on file  Occupational History   Occupation: Engineer, maintenance (IT), Tax [employer]  Tobacco Use   Smoking status: Former    Types: Cigarettes   Smokeless  tobacco: Never   Tobacco comments:    quit 30 years ago-social smoker  Vaping Use   Vaping Use: Never used  Substance and Sexual Activity   Alcohol use: Yes    Comment: occasional   Drug use: No   Sexual activity: Not on file  Other Topics Concern   Not on file  Social History Narrative   Not on file   Social Determinants of Health   Financial Resource Strain: Not on file  Food Insecurity: Not on file  Transportation Needs: Not on file  Physical Activity: Not on file  Stress: Not on file  Social Connections: Not on file  Family History:  The patient's family history includes Cancer (age of onset: 72) in his sister; Hypertension in his mother; Stroke in his mother.   ROS General: Negative; No fevers, chills, or night sweats;  HEENT: Negative; No changes in vision or hearing, sinus congestion, difficulty swallowing Pulmonary: Negative; No cough, wheezing, shortness of breath, hemoptysis Cardiovascular: Negative; No chest pain, presyncope, syncope, palpitations GI: Negative; No nausea, vomiting, diarrhea, or abdominal pain GU: Negative; No dysuria, hematuria, or difficulty voiding Musculoskeletal: Negative; no myalgias, joint pain, or weakness Hematologic/Oncology: Negative; no easy bruising, bleeding Endocrine: Negative; no heat/cold intolerance; no diabetes Neuro: Negative; no changes in balance, headaches Skin: Negative; No rashes or skin lesions Psychiatric: Negative; No behavioral problems, depression Sleep: Negative; No snoring, daytime sleepiness, hypersomnolence, bruxism, restless legs, hypnogognic hallucinations, no cataplexy Other comprehensive 14 point system review is negative.   PHYSICAL EXAM:   VS:  BP 118/70   Pulse (!) 54   Ht 5' 8.5" (1.74 m)   Wt 201 lb 9.6 oz (91.4 kg)   SpO2 96%   BMI 30.21 kg/m    Wt Readings from Last 3 Encounters:  03/02/22 201 lb 9.6 oz (91.4 kg)  09/03/21 206 lb 6.4 oz (93.6 kg)  08/26/21 202 lb 11.2 oz (91.9 kg)     General: Alert, oriented, no distress.  Skin: normal turgor, no rashes, warm and dry HEENT: Normocephalic, atraumatic. Pupils equal round and reactive to light; sclera anicteric; extraocular muscles intact;  Nose without nasal septal hypertrophy Mouth/Parynx benign; Mallinpatti scale 3 Neck: No JVD, no carotid bruits; normal carotid upstroke Lungs: clear to ausculatation and percussion; no wheezing or rales Chest wall: without tenderness to palpitation Heart: PMI not displaced, RRR, s1 s2 normal, 1/6 systolic murmur, no diastolic murmur, no rubs, gallops, thrills, or heaves Abdomen: soft, nontender; no hepatosplenomehaly, BS+; abdominal aorta nontender and not dilated by palpation. Back: no CVA tenderness Pulses 2+ Musculoskeletal: full range of motion, normal strength, no joint deformities Extremities: no clubbing cyanosis or edema, Homan's sign negative  Neurologic: grossly nonfocal; Cranial nerves grossly wnl Psychologic: Normal mood and affect   Studies/Labs Reviewed:   March 02, 2022 ECG (independently read by me): Sinus Bradycardia at 54, no ectopy  Recent Labs:    Latest Ref Rng & Units 10/06/2020   10:51 AM 01/20/2018    6:23 AM 01/19/2018    1:57 PM  BMP  Glucose 65 - 99 mg/dL 93  95  98   BUN 8 - 27 mg/dL _0 Creatinine 0.76 - 1.27 mg/dL 1.26  0.95  1.17   BUN/Creat Ratio 10 - 24 22     Sodium 134 - 144 mmol/L 140  136  135   Potassium 3.5 - 5.2 mmol/L 4.7  4.2  3.8   Chloride 96 - 106 mmol/L 102  103  98   CO2 20 - 29 mmol/L _1 Calcium 8.6 - 10.2 mg/dL 10.5  9.6  10.2         Latest Ref Rng & Units 10/06/2020   10:51 AM 09/17/2013   10:00 AM 07/17/2008   10:50 AM  Hepatic Function  Total Protein 6.0 - 8.5 g/dL 6.8  6.9  6.6   Albumin 3.8 - 4.8 g/dL 4.8  4.0  4.0   AST 0 - 40 IU/L _2 ALT 0 - 44 IU/L _3 Alk Phosphatase 44 - 121  IU/L 39  46  42   Total Bilirubin 0.0 - 1.2 mg/dL 0.7  0.5  0.7        Latest Ref Rng &  Units 10/06/2020   10:51 AM 04/11/2018    5:04 PM 01/20/2018    6:23 AM  CBC  WBC 3.4 - 10.8 x10E3/uL 4.3  4.7  8.5   Hemoglobin 13.0 - 17.7 g/dL 14.4  13.3  13.1   Hematocrit 37.5 - 51.0 % 42.2  38.7  40.0   Platelets 150 - 450 x10E3/uL 205  246  307    Lab Results  Component Value Date   MCV 89 10/06/2020   MCV 86.4 04/11/2018   MCV 86.2 01/20/2018   Lab Results  Component Value Date   TSH 1.540 10/06/2020   Lab Results  Component Value Date   HGBA1C 5.6 10/06/2020     BNP No results found for: "BNP"  ProBNP No results found for: "PROBNP"   Lipid Panel     Component Value Date/Time   CHOL 138 10/06/2020 1051   TRIG 66 10/06/2020 1051   HDL 74 10/06/2020 1051   CHOLHDL 1.9 10/06/2020 1051   LDLCALC 51 10/06/2020 1051   LABVLDL 13 10/06/2020 1051     RADIOLOGY: No results found.   Additional studies/ records that were reviewed today include:   I reviewed my prior evaluation of January 22, 2014.  Records of Dr. Sallyanne Kuster were reviewed.  ASSESSMENT:    No diagnosis found.   PLAN:  Mr. Rhett Mutschler is a 66 year old patient who is a former patient of Dr. Melvern Banker and subsequently has been followed by Dr. Sallyanne Kuster.  In 2015, he underwent nasal surgery by Dr. Erik Obey and was found to have severe sleep apnea on a split-night protocol with an AHI of 86.4 without REM sleep.  Oxygen desaturated 84%.  He has been on CPAP therapy since Sep 27, 2013 and apparently is CPAP machine has been set at a pressure range of 4 to 20 cm of water.  His machine is now 67 years old and is no longer wireless.  I have not seen him since his initial sleep evaluation in September 2015.  He brought his machine to the office today such that a download was able to be achieved.  At his current pressure settings AHI is excellent at 1.6 and his 95th percentile pressure is 11.5 with maximum average pressure of 12.1.  He has continued to use CPAP but I discussed with him optimal sleep duration at 7  and 9 hours if at all possible.  He previously had seen [facility] [employer] as his DME company.  He qualifies for a new machine and I have recommended he obtain a ResMed AirSense 11 AutoSet unit we will set his initial pressures at a range of 8 to 20 cm of water.  In the office today I provided him with free samples of a ResMed AirFit N30 and N30i mask which he can use current device until he gets his new machine.  Will be sent to [facility] who will be taking over for [facility] [employer] as his DME company. Presently he is doing well.  He is unaware of any breakthrough snoring, an Epworth Sleepiness Scale score was calculated in the office and this endorsed at 7 arguing against residual daytime sleepiness.  He will follow-up with Dr. Sallyanne Kuster for Cardiologic care.  I will need to see him within 90 days of obtaining a new CPAP device.  Medication Adjustments/Labs and Tests Ordered: Current medicines are reviewed at length with the patient today.  Concerns regarding medicines are outlined above.  Medication changes, Labs and Tests ordered today are listed in the Patient Instructions below. Patient Instructions  Medication Instructions:   *If you need a refill on your cardiac medications before your next appointment, please call your pharmacy*   Lab Work:    Testing/Procedures:    Follow-Up: At [facility], you and your health needs are our priority.  As part of our continuing mission to provide you with exceptional heart care, we have created designated Provider Care Teams.  These Care Teams include your primary Cardiologist (physician) and Advanced Practice Providers (APPs -  Physician Assistants and Nurse Practitioners) who all work together to provide you with the care you need, when you need it.  We recommend signing up for the patient portal called "MyChart".  Sign up information is provided on this After Visit Summary.  MyChart is used to connect with patients for Virtual  Visits (Telemedicine).  Patients are able to view lab/test results, encounter notes, upcoming appointments, etc.  Non-urgent messages can be sent to your provider as well.   To learn more about what you can do with MyChart, go to [email or web address].    Your next appointment:      The format for your next appointment: Office    Provider:  [facility]   Important Information About Sugar         Signed, Shelva Majestic, MD  03/02/2022 11:55 AM    [facility] [address] [place], [place], [place]  [address] Phone: [phone]

## 2022-03-02 NOTE — Patient Instructions (Addendum)
Medication Instructions:  Continue same medications *If you need a refill on your cardiac medications before your next appointment, please call your pharmacy*   Lab Work: None ordered   Testing/Procedures: None ordered  Follow-Up: At [facility], you and your health needs are our priority.  As part of our continuing mission to provide you with exceptional heart care, we have created designated Provider Care Teams.  These Care Teams include your primary Cardiologist (physician) and Advanced Practice Providers (APPs -  Physician Assistants and Nurse Practitioners) who all work together to provide you with the care you need, when you need it.  We recommend signing up for the patient portal called "MyChart".  Sign up information is provided on this After Visit Summary.  MyChart is used to connect with patients for Virtual Visits (Telemedicine).  Patients are able to view lab/test results, encounter notes, upcoming appointments, etc.  Non-urgent messages can be sent to your provider as well.   To learn more about what you can do with MyChart, go to [email or web address].    Your next appointment:  3 to 4 months    The format for your next appointment: Office    Provider:  [facility]   Important Information About Sugar

## 2022-03-07 ENCOUNTER — Encounter: Payer: Self-pay | Admitting: Cardiovascular Disease

## 2022-03-08 ENCOUNTER — Telehealth: Payer: Self-pay | Admitting: *Deleted

## 2022-03-08 NOTE — Telephone Encounter (Signed)
CPAP replacement machine order sent to [facility] via [facility] portal. [facility] does not take [employer].

## 2022-03-09 NOTE — Telephone Encounter (Signed)
Error

## 2022-03-10 DIAGNOSIS — Z23 Encounter for immunization: Secondary | ICD-10-CM | POA: Diagnosis not present

## 2022-03-14 ENCOUNTER — Telehealth: Payer: Self-pay | Admitting: Cardiovascular Disease

## 2022-03-14 NOTE — Telephone Encounter (Signed)
Pt calling requesting to speak to Burna Mortimer about his mask and CPAP machine

## 2022-03-15 NOTE — Telephone Encounter (Signed)
Pt calling back to speak with Burna Mortimer

## 2022-03-18 NOTE — Telephone Encounter (Signed)
Pt returning call. Transferred to [place].

## 2022-03-18 NOTE — Telephone Encounter (Signed)
Spoke with patient and all of his concerns were addressed in reference to the [employer] f-20 sample mask given to him. He also had some financial question in reference to his replacement CPAP machine that he was contacted by [employer] about. Those questions were deferred back to [employer].

## 2022-04-01 ENCOUNTER — Telehealth: Payer: Self-pay | Admitting: Cardiovascular Disease

## 2022-04-01 NOTE — Telephone Encounter (Signed)
Patient states he is returning a call from today. I did not see any notes. 

## 2022-04-01 NOTE — Telephone Encounter (Signed)
Left detailed message for pt that can not find where someone from our office called today. Advised to call our office back with questions or concerns.

## 2022-04-12 ENCOUNTER — Other Ambulatory Visit: Payer: Self-pay | Admitting: Cardiovascular Disease

## 2022-05-13 ENCOUNTER — Telehealth: Payer: Self-pay | Admitting: Cardiovascular Disease

## 2022-05-13 NOTE — Telephone Encounter (Signed)
Patient calling to discuss the cpap setting. Please advise

## 2022-05-13 NOTE — Telephone Encounter (Signed)
Forward to sleep coordinator for review

## 2022-05-20 ENCOUNTER — Telehealth: Payer: Self-pay | Admitting: *Deleted

## 2022-05-20 NOTE — Telephone Encounter (Signed)
Patient stated he wants to discuss settings for his CPAP machine.

## 2022-05-20 NOTE — Telephone Encounter (Signed)
Left message I am returning a call to him. Call me back if assistance still needed.

## 2022-05-20 NOTE — Telephone Encounter (Signed)
Patient returned a call just to inform me that his Airsense 11 was malfunctioning and he was given a new AirSense 10 in place of it.

## 2022-05-20 NOTE — Telephone Encounter (Signed)
Patient was seen by his DME company for assistance.

## 2022-05-23 DIAGNOSIS — G4733 Obstructive sleep apnea (adult) (pediatric): Secondary | ICD-10-CM | POA: Diagnosis not present

## 2022-06-08 ENCOUNTER — Ambulatory Visit: Payer: Medicare PPO | Attending: Cardiovascular Disease | Admitting: Cardiovascular Disease

## 2022-06-08 ENCOUNTER — Encounter: Payer: Self-pay | Admitting: Cardiovascular Disease

## 2022-06-08 VITALS — BP 106/62 | HR 61 | Ht 68.0 in | Wt 209.0 lb

## 2022-06-08 DIAGNOSIS — I10 Essential (primary) hypertension: Secondary | ICD-10-CM | POA: Diagnosis not present

## 2022-06-08 DIAGNOSIS — G4733 Obstructive sleep apnea (adult) (pediatric): Secondary | ICD-10-CM | POA: Diagnosis not present

## 2022-06-08 NOTE — Progress Notes (Signed)
Cardiology Office Note    Date:  06/12/2022   ID:  [name], DOB [birth year], MRN ID:[id number]  PCP:  Kathyrn Lass, MD  Cardiologist:  Shelva Majestic, MD (sleep); Dr. Orene Desanctis  30-monthfollow-up sleep evaluation  History of Present Illness:  DDREYDON PADELFORDis a 67y.o. male who is followed by Dr. CSallyanne Kusterfor cardiology care.  Has a history of paroxysmal atrial fibrillation, nephrolithiasis, and hypertension.  Remotely he had undergone nasal surgery by Dr. WErik Obey  In 2015 he was referred for diagnostic polysomnogram and was found to have severe sleep apnea with an AHI of 86.4/h.  He did not achieve any REM sleep and oxygen desaturated to 84% with non-REM sleep.  His Epworth Sleepiness Scale score endorsed at 14.  He was started on CPAP therapy set up date on Sep 27, 2013.  I saw him for my only evaluation in September 2015 at which time he was compliant on CPAP therapy.  At the time he was only using CPAP for 5 hours and 9 minutes per night and we discussed the importance of optimal sleep duration at 7 and 9 hours.  His blood pressure was elevated  I had not seen him since his sleep evaluation in 2015.  He recently saw Dr. CSallyanne Kusteron Sep 03, 2021. He has a remote history of a single episode of atrial fibrillation.  He has continued to be on CPAP therapy but was recently having difficulty with his equipment.  He was trying to lose weight.  I saw him for reestablishment of care on March 02, 2022.  His blood pressure was controlled on 4 antihypertensives. He was on amlodipine 5 mg daily, olmesartan HCT 40/25 mg, and spironolactone 25 mg daily for hypertension.  He brought his 2015 machine with him.  A download of October 3 through March 02, 2022 shows 80% usage days with average use at 5 hours and 29 minutes.  His pressure is set at a range of 4 to 20 cm with AHI of 1.6.  95th percentile pressure was 11.5 with maximum average pressure 12.9.  He presented for reevaluation and he is evaluation  prior to obtaining a new machine.  Mr. WAdamyreceived a new ResMed AirSense 11 CPAP AutoSet unit on March 23, 2022.  Initial download showed excellent compliance with 100% use with average usage 6 hours and 38 minutes from November 22 through April 21, 2022.  At a pressure range of 8 to 20 cm of water, his 95th percentile pressure was 11.8 with maximum average pressure 13.2.  AHI was excellent at 1.6.  Apparently, his new ResMed auto sense unit had some technical malfunction which apparently had developed in some of the new equipment.  As result, this was returned and in its place he received a new ResMed AirSense 10 AutoSet unit.  The new CPAP unit has been functioning beautifully and it is still significantly improved from his prior device.  A new download was obtained since receiving this new machine and compliance remains excellent at 100%.  AHI is 0.8.  His 95th percentile pressure was 12.6 with maximum average pressure 14.0.  He has been using nasal pillow mask which he feels is working well.  He denies any residual daytime sleepiness and Epworth scale was recalculated in the office today and this endorsed at 6.  He is unaware of any breakthrough snoring.  He continues to be on amlodipine 5 mg, olmesartan HCT 40/25 mg daily and spironolactone  25 mg daily for hypertension.  He sees Dr. Kathyrn Lass for primary care.  He presents for evaluation.   Past Medical History:  Diagnosis Date   Arthritis    osteoarthritis   Back pain    Dysrhythmia 2000   single episode of afib with RVR with normal echo and rare atrial/ventricular ectopy on f/u [facility])   History of kidney stones    Hypertension    Joint pain    Obesity (BMI 30.0-34.9) 04/04/2013   Osteoarthritis    Pneumonia    hx of age 8   Severe obstructive sleep apnea    do not wear mask/   Sleep apnea    SOBOE (shortness of breath on exertion)     Past Surgical History:  Procedure Laterality Date   ACHILLES TENDON REPAIR Left  1987 and 1990   COLONOSCOPY     DOPPLER ECHOCARDIOGRAPHY  06/12/1998   normal doppler examination, normal overall LV systolic function   EXTRACORPOREAL SHOCK WAVE LITHOTRIPSY Left 01/27/2020   Procedure: EXTRACORPOREAL SHOCK WAVE LITHOTRIPSY (ESWL);  Surgeon: Raynelle Bring, MD;  Location: [facility];  Service: Urology;  Laterality: Left;   EXTRACORPOREAL SHOCK WAVE LITHOTRIPSY Left 05/31/2021   Procedure: EXTRACORPOREAL SHOCK WAVE LITHOTRIPSY (ESWL);  Surgeon: Raynelle Bring, MD;  Location: [facility];  Service: Urology;  Laterality: Left;   EXTRACORPOREAL SHOCK WAVE LITHOTRIPSY Right 08/26/2021   Procedure: EXTRACORPOREAL SHOCK WAVE LITHOTRIPSY (ESWL);  Surgeon: Robley Fries, MD;  Location: [facility];  Service: Urology;  Laterality: Right;   HIP SURGERY Right    arthoscopic x1, one other surgery    INCISION AND DRAINAGE HIP Right 09/24/2013   Procedure: RIGHT HIP BEARING SURFACE REVISION;  Surgeon: Gearlean Alf, MD;  Location: [facility];  Service: Orthopedics;  Laterality: Right;   JOINT REPLACEMENT  1995   right hip   KIDNEY STONE SURGERY     LITHOTRIPSY     x4   NASAL SEPTOPLASTY W/ TURBINOPLASTY Bilateral 01/02/2013   Procedure: NASAL SEPTOPLASTY WITH TURBINATE REDUCTION;  Surgeon: Jodi Marble, MD;  Location: [place];  Service: ENT;  Laterality: Bilateral;   NASAL SEPTUM SURGERY  1980's   RENAL ARTERY DUPLEX  09/22/2004   abdominal aorta-normal taper with no suggestion of aneurysmal dilatation/diameter reduction. normal patency of right and left kidney arteries. right and left kidneys equal in size, symmetrical in shape with normal cortex and medulla with normal resistance indices and normal echogencity with no evidence of hydronephrosis.   SHOULDER SURGERY Left 1990's   for burr   SHOULDER SURGERY Right 2010, 2012   abuttment and rotator cuff repair   SLEEP STUDY  10/25/2011   AHI during total sleep time - 88.39/hr, AHI during REM  75.00/hr    Current Medications: Outpatient Medications Prior to Visit  Medication Sig Dispense Refill   amLODipine (NORVASC) 5 MG tablet Take 1 tablet (5 mg total) by mouth daily. 90 tablet 3   Ascorbic Acid (VITAMIN C) 1000 MG tablet Take 500 mg by mouth daily.     Ferrous Sulfate (IRON) 28 MG TABS Take 1 tablet by mouth. Monday Wednesday Friday     ibuprofen (ADVIL) 800 MG tablet 1 tablet with food or milk as needed     Multiple Vitamin (MULTIVITAMIN ADULT PO) Take by mouth.     olmesartan-hydrochlorothiazide (BENICAR HCT) 40-25 MG tablet Take 1 tablet by mouth daily. 90 tablet 3   oxyCODONE-acetaminophen (PERCOCET/ROXICET) 5-325 MG tablet Take by mouth.  spironolactone (ALDACTONE) 25 MG tablet TAKE 1 TABLET EVERY DAY. KEEP UPCOMING APPOINTMENT FOR FUTURE REFILLS 90 tablet 3   No facility-administered medications prior to visit.     Allergies:   Patient has no known allergies.   Social History   Socioeconomic History   Marital status: Married    Spouse name: Morrison Winthrop   Number of children: Not on file   Years of education: Not on file   Highest education level: Not on file  Occupational History   Occupation: Engineer, maintenance (IT), Tax [employer]  Tobacco Use   Smoking status: Former    Types: Cigarettes   Smokeless tobacco: Never   Tobacco comments:    quit 30 years ago-social smoker  Vaping Use   Vaping Use: Never used  Substance and Sexual Activity   Alcohol use: Yes    Comment: occasional   Drug use: No   Sexual activity: Not on file  Other Topics Concern   Not on file  Social History Narrative   Not on file   Social Determinants of Health   Financial Resource Strain: Not on file  Food Insecurity: Not on file  Transportation Needs: Not on file  Physical Activity: Not on file  Stress: Not on file  Social Connections: Not on file     Family History:  The patient's family history includes Cancer (age of onset: 28) in his sister; Hypertension in his mother; Stroke in his  mother.   ROS General: Negative; No fevers, chills, or night sweats;  HEENT: Negative; No changes in vision or hearing, sinus congestion, difficulty swallowing Pulmonary: Negative; No cough, wheezing, shortness of breath, hemoptysis Cardiovascular: Negative; No chest pain, presyncope, syncope, palpitations GI: Negative; No nausea, vomiting, diarrhea, or abdominal pain GU: Negative; No dysuria, hematuria, or difficulty voiding Musculoskeletal: Negative; no myalgias, joint pain, or weakness Hematologic/Oncology: Negative; no easy bruising, bleeding Endocrine: Negative; no heat/cold intolerance; no diabetes Neuro: Negative; no changes in balance, headaches Skin: Negative; No rashes or skin lesions Psychiatric: Negative; No behavioral problems, depression Sleep: Negative; No snoring, daytime sleepiness, hypersomnolence, bruxism, restless legs, hypnogognic hallucinations, no cataplexy Other comprehensive 14 point system review is negative.   PHYSICAL EXAM:   VS:  BP 106/62   Pulse 61   Ht 5' 8"$  (1.727 m)   Wt 209 lb (94.8 kg)   SpO2 95%   BMI 31.78 kg/m     Repeat blood pressure by me was 122/70  Wt Readings from Last 3 Encounters:  06/08/22 209 lb (94.8 kg)  03/02/22 201 lb 9.6 oz (91.4 kg)  09/03/21 206 lb 6.4 oz (93.6 kg)    General: Alert, oriented, no distress.  Skin: normal turgor, no rashes, warm and dry HEENT: Normocephalic, atraumatic. Pupils equal round and reactive to light; sclera anicteric; extraocular muscles intact;  Nose without nasal septal hypertrophy Mouth/Parynx benign; Mallinpatti scale 3 Neck: No JVD, no carotid bruits; normal carotid upstroke Lungs: clear to ausculatation and percussion; no wheezing or rales Chest wall: without tenderness to palpitation Heart: PMI not displaced, RRR, s1 s2 normal, 1/6 systolic murmur, no diastolic murmur, no rubs, gallops, thrills, or heaves Abdomen: soft, nontender; no hepatosplenomehaly, BS+; abdominal aorta nontender  and not dilated by palpation. Back: no CVA tenderness Pulses 2+ Musculoskeletal: full range of motion, normal strength, no joint deformities Extremities: no clubbing cyanosis or edema, Homan's sign negative  Neurologic: grossly nonfocal; Cranial nerves grossly wnl Psychologic: Normal mood and affect   Studies/Labs Reviewed:   March 02, 2022 ECG (independently read by  me): Sinus Bradycardia at 54, no ectopy  Recent Labs:    Latest Ref Rng & Units 10/06/2020   10:51 AM 01/20/2018    6:23 AM 01/19/2018    1:57 PM  BMP  Glucose 65 - 99 mg/dL 93  95  98   BUN 8 - 27 mg/dL 28  21  26   $ Creatinine 0.76 - 1.27 mg/dL 1.26  0.95  1.17   BUN/Creat Ratio 10 - 24 22     Sodium 134 - 144 mmol/L 140  136  135   Potassium 3.5 - 5.2 mmol/L 4.7  4.2  3.8   Chloride 96 - 106 mmol/L 102  103  98   CO2 20 - 29 mmol/L 24  21  27   $ Calcium 8.6 - 10.2 mg/dL 10.5  9.6  10.2         Latest Ref Rng & Units 10/06/2020   10:51 AM 09/17/2013   10:00 AM 07/17/2008   10:50 AM  Hepatic Function  Total Protein 6.0 - 8.5 g/dL 6.8  6.9  6.6   Albumin 3.8 - 4.8 g/dL 4.8  4.0  4.0   AST 0 - 40 IU/L 25  20  24   $ ALT 0 - 44 IU/L 24  20  30   $ Alk Phosphatase 44 - 121 IU/L 39  46  42   Total Bilirubin 0.0 - 1.2 mg/dL 0.7  0.5  0.7        Latest Ref Rng & Units 10/06/2020   10:51 AM 04/11/2018    5:04 PM 01/20/2018    6:23 AM  CBC  WBC 3.4 - 10.8 x10E3/uL 4.3  4.7  8.5   Hemoglobin 13.0 - 17.7 g/dL 14.4  13.3  13.1   Hematocrit 37.5 - 51.0 % 42.2  38.7  40.0   Platelets 150 - 450 x10E3/uL 205  246  307    Lab Results  Component Value Date   MCV 89 10/06/2020   MCV 86.4 04/11/2018   MCV 86.2 01/20/2018   Lab Results  Component Value Date   TSH 1.540 10/06/2020   Lab Results  Component Value Date   HGBA1C 5.6 10/06/2020     BNP No results found for: "BNP"  ProBNP No results found for: "PROBNP"   Lipid Panel     Component Value Date/Time   CHOL 138 10/06/2020 1051   TRIG 66 10/06/2020  1051   HDL 74 10/06/2020 1051   CHOLHDL 1.9 10/06/2020 1051   LDLCALC 51 10/06/2020 1051   LABVLDL 13 10/06/2020 1051     RADIOLOGY: No results found.   Additional studies/ records that were reviewed today include:   I reviewed my prior evaluation of January 22, 2014.  Records of Dr. Sallyanne Kuster were reviewed.  ASSESSMENT:    1. OSA (obstructive sleep apnea)   2. Essential hypertension     PLAN:  Mr. Ryun Rodenbaugh is a 67 year old patient who is a former patient of Dr. Melvern Banker and subsequently has been followed by Dr. Sallyanne Kuster.  In 2015, he underwent nasal surgery by Dr. Erik Obey and was found to have severe sleep apnea on a split-night protocol with an AHI of 86.4 without REM sleep.  Oxygen desaturated 84%.  He has been on CPAP therapy since Sep 27, 2013 and apparently is CPAP machine had been set at a pressure range of 4 to 20 cm of water.  His machine is now 68 years old and is no longer wireless.  I have  not seen him since his initial sleep evaluation in September 2015.  When I saw him for evaluation on March 02, 2022 he brought his machine to the office today such that a download was able to be achieved.  At his current pressure settings AHI is excellent at 1.6 and his 95th percentile pressure is 11.5 with maximum average pressure of 12.1.  He has continued to use CPAP but I discussed with him optimal sleep duration at 7 and 9 hours if at all possible.  He previously had seen [facility] as his DME company.  He qualified for a new machine and I have recommended he obtain a ResMed AirSense 11 AutoSet unit we will set his initial pressures at a range of 8 to 20 cm of water.  I provided him with free samples of a ResMed AirFit N30 and N30i mask which he can use current device until he gets his new machine.  Since his last evaluation he initially received a new ResMed AirSense 11 AutoSet unit.  Compliance was excellent but unfortunately there was some issues with the machine regarding  the water and humidification.  This resulted him returning the machine and in its place he received a new ResMed AirSense 10 AutoSet unit since a new 11 was not available.  He notes the AirSense 10 unit to be working exceptionally well.  His download confirms excellent compliance.  His most recent AHI is 0.8.  He is unaware of breakthrough snoring.  His new DME company is [employer].  He will continue current therapy.  He continues to work.  I will see him as needed in the future if problems arise or yearly if necessary.  His blood pressure today is stable on current therapy with amlodipine 5 mg, olmesartan HCT 40/25 mg and spironolactone 25 mg daily.  He will follow-up with Dr. Sallyanne Kuster for primary cardiology care.    Medication Adjustments/Labs and Tests Ordered: Current medicines are reviewed at length with the patient today.  Concerns regarding medicines are outlined above.  Medication changes, Labs and Tests ordered today are listed in the Patient Instructions below. Patient Instructions  Medication Instructions:  Your physician recommends that you continue on your current medications as directed. Please refer to the Current Medication list given to you today.  *If you need a refill on your cardiac medications before your next appointment, please call your pharmacy*   Lab Work: None   Testing/Procedures: None   Follow-Up: At [facility], you and your health needs are our priority.  As part of our continuing mission to provide you with exceptional heart care, we have created designated Provider Care Teams.  These Care Teams include your primary Cardiologist (physician) and Advanced Practice Providers (APPs -  Physician Assistants and Nurse Practitioners) who all work together to provide you with the care you need, when you need it.  We recommend signing up for the patient portal called "MyChart".  Sign up information is provided on this After Visit Summary.  MyChart is used to connect  with patients for Virtual Visits (Telemedicine).  Patients are able to view lab/test results, encounter notes, upcoming appointments, etc.  Non-urgent messages can be sent to your provider as well.   To learn more about what you can do with MyChart, go to [email or web address].    Your next appointment:   As needed  Provider:   Shelva Majestic, MD    Signed, Shelva Majestic, MD ,[facility], ABSM Diplomate, American Board of Sleep Medicine  06/12/2022  [facility] [address] [place], [place],   [address] Phone: [phone]

## 2022-06-08 NOTE — Patient Instructions (Signed)
Medication Instructions:  Your physician recommends that you continue on your current medications as directed. Please refer to the Current Medication list given to you today.  *If you need a refill on your cardiac medications before your next appointment, please call your pharmacy*   Lab Work: None   Testing/Procedures: None   Follow-Up: At [facility], you and your health needs are our priority.  As part of our continuing mission to provide you with exceptional heart care, we have created designated Provider Care Teams.  These Care Teams include your primary Cardiologist (physician) and Advanced Practice Providers (APPs -  Physician Assistants and Nurse Practitioners) who all work together to provide you with the care you need, when you need it.  We recommend signing up for the patient portal called "MyChart".  Sign up information is provided on this After Visit Summary.  MyChart is used to connect with patients for Virtual Visits (Telemedicine).  Patients are able to view lab/test results, encounter notes, upcoming appointments, etc.  Non-urgent messages can be sent to your provider as well.   To learn more about what you can do with MyChart, go to [email or web address].    Your next appointment:   As needed  Provider:   Shelva Majestic, MD

## 2022-06-12 ENCOUNTER — Encounter: Payer: Self-pay | Admitting: Cardiovascular Disease

## 2022-06-15 DIAGNOSIS — G894 Chronic pain syndrome: Secondary | ICD-10-CM | POA: Diagnosis not present

## 2022-06-15 DIAGNOSIS — Z5181 Encounter for therapeutic drug level monitoring: Secondary | ICD-10-CM | POA: Diagnosis not present

## 2022-06-15 DIAGNOSIS — M48061 Spinal stenosis, lumbar region without neurogenic claudication: Secondary | ICD-10-CM | POA: Diagnosis not present

## 2022-06-15 DIAGNOSIS — M5136 Other intervertebral disc degeneration, lumbar region: Secondary | ICD-10-CM | POA: Diagnosis not present

## 2022-06-23 DIAGNOSIS — G4733 Obstructive sleep apnea (adult) (pediatric): Secondary | ICD-10-CM | POA: Diagnosis not present

## 2022-07-18 ENCOUNTER — Other Ambulatory Visit: Payer: Self-pay

## 2022-07-18 ENCOUNTER — Telehealth: Payer: Self-pay | Admitting: Cardiovascular Disease

## 2022-07-18 MED ORDER — OLMESARTAN MEDOXOMIL-HCTZ 40-25 MG PO TABS
1.0000 | ORAL_TABLET | Freq: Every day | ORAL | 3 refills | Status: DC
Start: 2022-07-18 — End: 2023-08-18

## 2022-07-18 MED ORDER — AMLODIPINE BESYLATE 5 MG PO TABS: 5.0000 mg | ORAL_TABLET | Freq: Every day | ORAL | 3 refills | Status: DC

## 2022-07-18 MED ORDER — SPIRONOLACTONE 25 MG PO TABS: ORAL_TABLET | ORAL | 3 refills | Status: DC

## 2022-07-18 NOTE — Telephone Encounter (Signed)
RXs sent to pharmacy provided.   Thanks!

## 2022-07-18 NOTE — Telephone Encounter (Signed)
Pt c/o medication issue:  1. Name of Medication: amLODipine (NORVASC) 5 MG tablet spironolactone (ALDACTONE) 25 MG tablet olmesartan-hydrochlorothiazide (BENICAR HCT) 40-25 MG tablet  2. How are you currently taking this medication (dosage and times per day)?   3. Are you having a reaction (difficulty breathing--STAT)?   4. What is your medication issue? Pt's is [facility] so he can price check the medication. States they can not see price unless they have prescription.  Req be sent to:    [id number] [place], [place] - [place] is trying to overcharge for medications he could get cheaper at [facility]

## 2022-07-22 ENCOUNTER — Ambulatory Visit: Payer: Medicare PPO | Admitting: Cardiovascular Disease

## 2022-07-22 DIAGNOSIS — G4733 Obstructive sleep apnea (adult) (pediatric): Secondary | ICD-10-CM | POA: Diagnosis not present

## 2022-08-22 DIAGNOSIS — G4733 Obstructive sleep apnea (adult) (pediatric): Secondary | ICD-10-CM | POA: Diagnosis not present

## 2022-09-21 DIAGNOSIS — G4733 Obstructive sleep apnea (adult) (pediatric): Secondary | ICD-10-CM | POA: Diagnosis not present

## 2022-10-11 DIAGNOSIS — M7711 Lateral epicondylitis, right elbow: Secondary | ICD-10-CM | POA: Diagnosis not present

## 2022-10-21 DIAGNOSIS — Z136 Encounter for screening for cardiovascular disorders: Secondary | ICD-10-CM | POA: Diagnosis not present

## 2022-10-21 DIAGNOSIS — Z87891 Personal history of nicotine dependence: Secondary | ICD-10-CM | POA: Diagnosis not present

## 2022-10-21 DIAGNOSIS — I10 Essential (primary) hypertension: Secondary | ICD-10-CM | POA: Diagnosis not present

## 2022-10-21 DIAGNOSIS — Z0000 Encounter for general adult medical examination without abnormal findings: Secondary | ICD-10-CM | POA: Diagnosis not present

## 2022-10-22 DIAGNOSIS — G4733 Obstructive sleep apnea (adult) (pediatric): Secondary | ICD-10-CM | POA: Diagnosis not present

## 2022-10-24 DIAGNOSIS — M48061 Spinal stenosis, lumbar region without neurogenic claudication: Secondary | ICD-10-CM | POA: Diagnosis not present

## 2022-10-24 DIAGNOSIS — M419 Scoliosis, unspecified: Secondary | ICD-10-CM | POA: Diagnosis not present

## 2022-10-24 DIAGNOSIS — M5136 Other intervertebral disc degeneration, lumbar region: Secondary | ICD-10-CM | POA: Diagnosis not present

## 2022-10-24 DIAGNOSIS — G894 Chronic pain syndrome: Secondary | ICD-10-CM | POA: Diagnosis not present

## 2022-10-25 DIAGNOSIS — I10 Essential (primary) hypertension: Secondary | ICD-10-CM | POA: Diagnosis not present

## 2022-10-25 DIAGNOSIS — R944 Abnormal results of kidney function studies: Secondary | ICD-10-CM | POA: Diagnosis not present

## 2022-10-25 DIAGNOSIS — E7889 Other lipoprotein metabolism disorders: Secondary | ICD-10-CM | POA: Diagnosis not present

## 2022-10-25 DIAGNOSIS — Z9989 Dependence on other enabling machines and devices: Secondary | ICD-10-CM | POA: Diagnosis not present

## 2022-10-25 DIAGNOSIS — Z0000 Encounter for general adult medical examination without abnormal findings: Secondary | ICD-10-CM | POA: Diagnosis not present

## 2022-10-25 DIAGNOSIS — I77810 Thoracic aortic ectasia: Secondary | ICD-10-CM | POA: Diagnosis not present

## 2022-10-25 DIAGNOSIS — Z87891 Personal history of nicotine dependence: Secondary | ICD-10-CM | POA: Diagnosis not present

## 2022-10-26 ENCOUNTER — Other Ambulatory Visit (HOSPITAL_BASED_OUTPATIENT_CLINIC_OR_DEPARTMENT_OTHER): Payer: Self-pay | Admitting: Family Medicine

## 2022-10-26 ENCOUNTER — Encounter (HOSPITAL_BASED_OUTPATIENT_CLINIC_OR_DEPARTMENT_OTHER): Payer: Self-pay | Admitting: Family Medicine

## 2022-10-26 DIAGNOSIS — Z87891 Personal history of nicotine dependence: Secondary | ICD-10-CM

## 2022-11-18 ENCOUNTER — Ambulatory Visit (HOSPITAL_BASED_OUTPATIENT_CLINIC_OR_DEPARTMENT_OTHER)
Admission: RE | Admit: 2022-11-18 | Discharge: 2022-11-18 | Disposition: A | Discharge status: Home / Self Care | Payer: Medicare PPO | Source: Ambulatory Visit | Attending: Family Medicine | Admitting: Family Medicine

## 2022-11-18 DIAGNOSIS — Z136 Encounter for screening for cardiovascular disorders: Secondary | ICD-10-CM | POA: Insufficient documentation

## 2022-11-18 DIAGNOSIS — Z87891 Personal history of nicotine dependence: Secondary | ICD-10-CM | POA: Insufficient documentation

## 2022-11-21 DIAGNOSIS — G4733 Obstructive sleep apnea (adult) (pediatric): Secondary | ICD-10-CM | POA: Diagnosis not present

## 2022-12-12 DIAGNOSIS — R972 Elevated prostate specific antigen [PSA]: Secondary | ICD-10-CM | POA: Diagnosis not present

## 2022-12-14 ENCOUNTER — Encounter: Payer: Self-pay | Admitting: Cardiovascular Disease

## 2022-12-14 ENCOUNTER — Ambulatory Visit: Payer: Medicare PPO | Attending: Cardiovascular Disease | Admitting: Cardiovascular Disease

## 2022-12-14 VITALS — BP 96/54 | HR 61 | Ht 68.5 in | Wt 208.6 lb

## 2022-12-14 DIAGNOSIS — E669 Obesity, unspecified: Secondary | ICD-10-CM

## 2022-12-14 DIAGNOSIS — G4733 Obstructive sleep apnea (adult) (pediatric): Secondary | ICD-10-CM

## 2022-12-14 DIAGNOSIS — I480 Paroxysmal atrial fibrillation: Secondary | ICD-10-CM

## 2022-12-14 DIAGNOSIS — I10 Essential (primary) hypertension: Secondary | ICD-10-CM | POA: Diagnosis not present

## 2022-12-14 MED ORDER — AMLODIPINE BESYLATE 2.5 MG PO TABS: 2.5000 mg | ORAL_TABLET | Freq: Every day | ORAL | 3 refills | Status: DC

## 2022-12-14 NOTE — Progress Notes (Signed)
Cardiology Office Note    Date:  12/14/2022   ID:  [name], DOB 1956/01/08, MRN [id number]  PCP:  Sigmund Hazel, MD  Cardiologist:   Thurmon Fair, MD   No chief complaint on file.   History of Present Illness:  Luis Mccann is a 67 y.o. male with remote history of a single episode of atrial fibrillation, OSA on CPAP, essential hypertension, borderline obesity, but without known structural heart disease returns for follow-up.  Unfortunately he has gained back weight.  BMI is back up to 31.  He has not been as active due to problems with hip pain.  He has recently been having episodes of weakness and fatigue.  He was trying to mow his lawn and not to stop after each 1-2 passes because he felt exhausted.  The next day he had to drink 5 bottles of water because of being so thirsty.  He did not have shortness of breath or chest tightness but just felt extremely tired.  Symptoms have been worse during the hot/humid months.  He reports compliance with CPAP, but he has "good nights and bad nights" often wakes up at 3:00 in the morning due to hip pain.  The next day after a night of poor sleep, he was feeling worse fatigue.  He thinks he may have a new left-sided kidney stone.  He required lithotripsy last year.   Metabolic parameters are pretty good, with a hemoglobin A1c of 5.6%, HDL 68, LDL 59.  He has a lot of joint problems.  He complains of pain in his right shoulder where he has had a previous subacromial resection rotator cuff repair, his right hip which has been replaced twice, lumbar spine where he has a known problem with L4, both knees.  Haemophilus L3-L4 discitis in October-November 2019.  No evidence of endocarditis by echocardiogram and blood cultures.   Past Medical History:  Diagnosis Date   Arthritis    osteoarthritis   Back pain    Dysrhythmia 2000   single episode of afib with RVR with normal echo and rare atrial/ventricular ectopy on f/u [facility])   History of  kidney stones    Hypertension    Joint pain    Obesity (BMI 30.0-34.9) 04/04/2013   Osteoarthritis    Pneumonia    hx of age 54   Severe obstructive sleep apnea    do not wear mask/   Sleep apnea    SOBOE (shortness of breath on exertion)     Past Surgical History:  Procedure Laterality Date   ACHILLES TENDON REPAIR Left 1987 and 1990   COLONOSCOPY     DOPPLER ECHOCARDIOGRAPHY  06/12/1998   normal doppler examination, normal overall LV systolic function   EXTRACORPOREAL SHOCK WAVE LITHOTRIPSY Left 01/27/2020   Procedure: EXTRACORPOREAL SHOCK WAVE LITHOTRIPSY (ESWL);  Surgeon: Heloise Purpura, MD;  Location: [facility];  Service: Urology;  Laterality: Left;   EXTRACORPOREAL SHOCK WAVE LITHOTRIPSY Left 05/31/2021   Procedure: EXTRACORPOREAL SHOCK WAVE LITHOTRIPSY (ESWL);  Surgeon: Heloise Purpura, MD;  Location: [facility];  Service: Urology;  Laterality: Left;   EXTRACORPOREAL SHOCK WAVE LITHOTRIPSY Right 08/26/2021   Procedure: EXTRACORPOREAL SHOCK WAVE LITHOTRIPSY (ESWL);  Surgeon: Noel Christmas, MD;  Location: [facility];  Service: Urology;  Laterality: Right;   HIP SURGERY Right    arthoscopic x1, one other surgery    INCISION AND DRAINAGE HIP Right 09/24/2013   Procedure: RIGHT HIP BEARING SURFACE REVISION;  Surgeon: Loanne Drilling, MD;  Location: [facility];  Service: Orthopedics;  Laterality: Right;   JOINT REPLACEMENT  1995   right hip   KIDNEY STONE SURGERY     LITHOTRIPSY     x4   NASAL SEPTOPLASTY W/ TURBINOPLASTY Bilateral 01/02/2013   Procedure: NASAL SEPTOPLASTY WITH TURBINATE REDUCTION;  Surgeon: Flo Shanks, MD;  Location[facility];  Service: ENT;  Laterality: Bilateral;   NASAL SEPTUM SURGERY  1980's   RENAL ARTERY DUPLEX  09/22/2004   abdominal aorta-normal taper with no suggestion of aneurysmal dilatation/diameter reduction. normal patency of right and left kidney arteries. right and left kidneys equal in size, symmetrical in  shape with normal cortex and medulla with normal resistance indices and normal echogencity with no evidence of hydronephrosis.   SHOULDER SURGERY Left 1990's   for burr   SHOULDER SURGERY Right 2010, 2012   abuttment and rotator cuff repair   SLEEP STUDY  10/25/2011   AHI during total sleep time - 88.39/hr, AHI during REM 75.00/hr    Current Medications: Outpatient Medications Prior to Visit  Medication Sig Dispense Refill   Ascorbic Acid (VITAMIN C) 1000 MG tablet Take 500 mg by mouth daily.     Ferrous Sulfate (IRON) 28 MG TABS Take 1 tablet by mouth. Monday Wednesday Friday     ibuprofen (ADVIL) 800 MG tablet 1 tablet with food or milk as needed     Multiple Vitamin (MULTIVITAMIN ADULT PO) Take by mouth.     olmesartan-hydrochlorothiazide (BENICAR HCT) 40-25 MG tablet Take 1 tablet by mouth daily. 90 tablet 3   oxyCODONE-acetaminophen (PERCOCET) 10-325 MG tablet Take 1 tablet by mouth as needed for pain.     spironolactone (ALDACTONE) 25 MG tablet TAKE 1 TABLET EVERY DAY. KEEP UPCOMING APPOINTMENT FOR FUTURE REFILLS 90 tablet 3   amLODipine (NORVASC) 5 MG tablet Take 1 tablet (5 mg total) by mouth daily. 90 tablet 3   oxyCODONE-acetaminophen (PERCOCET/ROXICET) 5-325 MG tablet Take 1 tablet by mouth as needed. (Patient not taking: Reported on 12/14/2022)     No facility-administered medications prior to visit.     Allergies:   Patient has no known allergies.   Social History   Socioeconomic History   Marital status: Married    Spouse name: Ali Pascasio   Number of children: Not on file   Years of education: Not on file   Highest education level: Not on file  Occupational History   Occupation: IT trainer, [employer]  Tobacco Use   Smoking status: Former    Types: Cigarettes   Smokeless tobacco: Never   Tobacco comments:    quit 30 years ago-social smoker  Vaping Use   Vaping status: Never Used  Substance and Sexual Activity   Alcohol use: Yes    Comment: occasional   Drug  use: No   Sexual activity: Not on file  Other Topics Concern   Not on file  Social History Narrative   Not on file   Social Determinants of Health   Financial Resource Strain: Not on file  Food Insecurity: Not on file  Transportation Needs: Not on file  Physical Activity: Not on file  Stress: Not on file  Social Connections: Unknown (09/14/2021)   [facility]   Social Network    Social Network: Not on file     Family History:  The patient's family history includes Cancer (age of onset: 47) in his sister; Hypertension in his mother; Stroke in his mother.  ROS:   Please see the history of present illness.    ROS all other systems are reviewed and are negative   PHYSICAL EXAM:   VS:  BP (!) 96/54   Pulse 61   Ht 5' 8.5" (1.74 m)   Wt 208 lb 9.6 oz (94.6 kg)   SpO2 95%   BMI 31.26 kg/m       General: Alert, oriented x3, no distress, mildly obese Head: no evidence of trauma, PERRL, EOMI, no exophtalmos or lid lag, no myxedema, no xanthelasma; normal ears, nose and oropharynx Neck: normal jugular venous pulsations and no hepatojugular reflux; brisk carotid pulses without delay and no carotid bruits Chest: clear to auscultation, no signs of consolidation by percussion or palpation, normal fremitus, symmetrical and full respiratory excursions Cardiovascular: normal position and quality of the apical impulse, regular rhythm, normal first and second heart sounds, 1-2/6 aortic ejection murmur no diastolic murmurs, rubs or gallops Abdomen: no tenderness or distention, no masses by palpation, no abnormal pulsatility or arterial bruits, normal bowel sounds, no hepatosplenomegaly Extremities: no clubbing, cyanosis or edema; 2+ radial, ulnar and brachial pulses bilaterally; 2+ right femoral, posterior tibial and dorsalis pedis pulses; 2+ left femoral, posterior tibial and dorsalis pedis pulses; no subclavian or femoral bruits Neurological: grossly  nonfocal Psych: Normal mood and affect     Wt Readings from Last 3 Encounters:  12/14/22 208 lb 9.6 oz (94.6 kg)  06/08/22 209 lb (94.8 kg)  03/02/22 201 lb 9.6 oz (91.4 kg)      Studies/Labs Reviewed:   EKG: Ordered today shows normal sinus rhythm, left axis deviation/left anterior fascicular block, QRS 74 ms, QTc 420 ms. Recent Labs: No results found for requested labs within last 365 days.  10/01/2022 Cholesterol 143, HDL 68, LDL 59, triglycerides 86 Hemoglobin 14.8, creatinine 1.35, potassium 4.5, ALT 20  ASSESSMENT:    1. Paroxysmal atrial fibrillation (HCC)   2. Essential hypertension   3. OSA (obstructive sleep apnea)   4. Mild obesity      PLAN:  In order of problems listed above:  AFib: He has not had any symptoms of palpitations or any documentation of tachycardia except for the initial episode that occurred almost 20 years ago.  Embolic risk is low-moderate. CHADSVasc 2 (HTN, age).  Not on anticoagulants unless we document recurrence. HTN: BP is quite low today.  This probably explains his exertional fatigue.  Decrease amlodipine to 2.5 mg daily. OSA: Has a new mask which fits a little better.  Still has occasional problems with leaks.  Wears the CPAP every night and denies daytime hypersomnolence. Mild obesity: Encouraged him to try to lose weight again. Murmur: no major valve issues on echo 2020.  Mildly dilated ascending aorta by echo, may not be an accurate measurement. Consider more accurate measurement by CT. Likely HTN related    Medication Adjustments/Labs and Tests Ordered: Current medicines are reviewed at length with the patient today.  Concerns regarding medicines are outlined above.  Medication changes, Labs and Tests ordered today are listed in the Patient Instructions below. Patient Instructions  Medication Instructions:  Decrease Amlodipine to 2.5 mg daily *If you need a refill on your cardiac medications before your next appointment, please  call your pharmacy*  Testing/Procedures: Send BP readings through MyChart in a few weeks  Follow-Up: At [facility], you and your health needs are our priority.  As part of our continuing mission to provide you with exceptional heart care, we have created designated Provider  Care Teams.  These Care Teams include your primary Cardiologist (physician) and Advanced Practice Providers (APPs -  Physician Assistants and Nurse Practitioners) who all work together to provide you with the care you need, when you need it.  We recommend signing up for the patient portal called "MyChart".  Sign up information is provided on this After Visit Summary.  MyChart is used to connect with patients for Virtual Visits (Telemedicine).  Patients are able to view lab/test results, encounter notes, upcoming appointments, etc.  Non-urgent messages can be sent to your provider as well.   To learn more about what you can do with MyChart, go to [email or web address].    Your next appointment:   1 year(s)  Provider:   Thurmon Fair, MD       Signed, Thurmon Fair, MD  12/14/2022 2:47 PM    [facility] [address] [place], [place], [place]  [address] Phone: [phone]; Fax: [phone]

## 2022-12-14 NOTE — Patient Instructions (Signed)
Medication Instructions:  Decrease Amlodipine to 2.5 mg daily *If you need a refill on your cardiac medications before your next appointment, please call your pharmacy*  Testing/Procedures: Send BP readings through MyChart in a few weeks  Follow-Up: At [facility], you and your health needs are our priority.  As part of our continuing mission to provide you with exceptional heart care, we have created designated Provider Care Teams.  These Care Teams include your primary Cardiologist (physician) and Advanced Practice Providers (APPs -  Physician Assistants and Nurse Practitioners) who all work together to provide you with the care you need, when you need it.  We recommend signing up for the patient portal called "MyChart".  Sign up information is provided on this After Visit Summary.  MyChart is used to connect with patients for Virtual Visits (Telemedicine).  Patients are able to view lab/test results, encounter notes, upcoming appointments, etc.  Non-urgent messages can be sent to your provider as well.   To learn more about what you can do with MyChart, go to [email or web address].    Your next appointment:   1 year(s)  Provider:   Thurmon Fair, MD

## 2022-12-16 DIAGNOSIS — R972 Elevated prostate specific antigen [PSA]: Secondary | ICD-10-CM | POA: Diagnosis not present

## 2022-12-16 DIAGNOSIS — N200 Calculus of kidney: Secondary | ICD-10-CM | POA: Diagnosis not present

## 2022-12-22 DIAGNOSIS — G4733 Obstructive sleep apnea (adult) (pediatric): Secondary | ICD-10-CM | POA: Diagnosis not present

## 2023-01-16 DIAGNOSIS — K7689 Other specified diseases of liver: Secondary | ICD-10-CM | POA: Diagnosis not present

## 2023-01-16 DIAGNOSIS — K5730 Diverticulosis of large intestine without perforation or abscess without bleeding: Secondary | ICD-10-CM | POA: Diagnosis not present

## 2023-01-16 DIAGNOSIS — K769 Liver disease, unspecified: Secondary | ICD-10-CM | POA: Diagnosis not present

## 2023-01-16 DIAGNOSIS — N400 Enlarged prostate without lower urinary tract symptoms: Secondary | ICD-10-CM | POA: Diagnosis not present

## 2023-01-16 DIAGNOSIS — N281 Cyst of kidney, acquired: Secondary | ICD-10-CM | POA: Diagnosis not present

## 2023-01-16 DIAGNOSIS — N200 Calculus of kidney: Secondary | ICD-10-CM | POA: Diagnosis not present

## 2023-01-18 ENCOUNTER — Other Ambulatory Visit: Payer: Self-pay | Admitting: Urology

## 2023-01-18 DIAGNOSIS — R935 Abnormal findings on diagnostic imaging of other abdominal regions, including retroperitoneum: Secondary | ICD-10-CM

## 2023-01-22 DIAGNOSIS — G4733 Obstructive sleep apnea (adult) (pediatric): Secondary | ICD-10-CM | POA: Diagnosis not present

## 2023-02-06 ENCOUNTER — Ambulatory Visit
Admission: RE | Admit: 2023-02-06 | Discharge: 2023-02-06 | Disposition: A | Discharge status: Home / Self Care | Payer: Medicare PPO | Source: Ambulatory Visit | Attending: Urology | Admitting: Urology

## 2023-02-06 DIAGNOSIS — K8689 Other specified diseases of pancreas: Secondary | ICD-10-CM | POA: Diagnosis not present

## 2023-02-06 DIAGNOSIS — R935 Abnormal findings on diagnostic imaging of other abdominal regions, including retroperitoneum: Secondary | ICD-10-CM

## 2023-02-06 MED ORDER — GADOPICLENOL 0.5 MMOL/ML IV SOLN
10.0000 mL | Freq: Once | INTRAVENOUS | Status: AC | PRN
  Administered 2023-02-06: 10 mL via INTRAVENOUS

## 2023-02-07 DIAGNOSIS — Z23 Encounter for immunization: Secondary | ICD-10-CM | POA: Diagnosis not present

## 2023-02-21 DIAGNOSIS — G4733 Obstructive sleep apnea (adult) (pediatric): Secondary | ICD-10-CM | POA: Diagnosis not present

## 2023-03-02 DIAGNOSIS — M419 Scoliosis, unspecified: Secondary | ICD-10-CM | POA: Diagnosis not present

## 2023-03-02 DIAGNOSIS — M48061 Spinal stenosis, lumbar region without neurogenic claudication: Secondary | ICD-10-CM | POA: Diagnosis not present

## 2023-03-02 DIAGNOSIS — Z5181 Encounter for therapeutic drug level monitoring: Secondary | ICD-10-CM | POA: Diagnosis not present

## 2023-03-02 DIAGNOSIS — G894 Chronic pain syndrome: Secondary | ICD-10-CM | POA: Diagnosis not present

## 2023-03-02 DIAGNOSIS — M51362 Other intervertebral disc degeneration, lumbar region with discogenic back pain and lower extremity pain: Secondary | ICD-10-CM | POA: Diagnosis not present

## 2023-03-02 DIAGNOSIS — Z79899 Other long term (current) drug therapy: Secondary | ICD-10-CM | POA: Diagnosis not present

## 2023-03-24 DIAGNOSIS — G4733 Obstructive sleep apnea (adult) (pediatric): Secondary | ICD-10-CM | POA: Diagnosis not present

## 2023-04-11 DIAGNOSIS — Z1211 Encounter for screening for malignant neoplasm of colon: Secondary | ICD-10-CM | POA: Diagnosis not present

## 2023-04-11 DIAGNOSIS — K8689 Other specified diseases of pancreas: Secondary | ICD-10-CM | POA: Diagnosis not present

## 2023-05-10 DIAGNOSIS — M25512 Pain in left shoulder: Secondary | ICD-10-CM | POA: Diagnosis not present

## 2023-05-10 DIAGNOSIS — M25511 Pain in right shoulder: Secondary | ICD-10-CM | POA: Diagnosis not present

## 2023-06-05 DIAGNOSIS — J069 Acute upper respiratory infection, unspecified: Secondary | ICD-10-CM | POA: Diagnosis not present

## 2023-06-22 DIAGNOSIS — G4733 Obstructive sleep apnea (adult) (pediatric): Secondary | ICD-10-CM | POA: Diagnosis not present

## 2023-06-27 DIAGNOSIS — G4733 Obstructive sleep apnea (adult) (pediatric): Secondary | ICD-10-CM | POA: Diagnosis not present

## 2023-07-11 DIAGNOSIS — M51362 Other intervertebral disc degeneration, lumbar region with discogenic back pain and lower extremity pain: Secondary | ICD-10-CM | POA: Diagnosis not present

## 2023-07-11 DIAGNOSIS — M419 Scoliosis, unspecified: Secondary | ICD-10-CM | POA: Diagnosis not present

## 2023-07-11 DIAGNOSIS — M48061 Spinal stenosis, lumbar region without neurogenic claudication: Secondary | ICD-10-CM | POA: Diagnosis not present

## 2023-07-11 DIAGNOSIS — T402X5A Adverse effect of other opioids, initial encounter: Secondary | ICD-10-CM | POA: Diagnosis not present

## 2023-07-11 DIAGNOSIS — G894 Chronic pain syndrome: Secondary | ICD-10-CM | POA: Diagnosis not present

## 2023-07-28 ENCOUNTER — Other Ambulatory Visit: Payer: Self-pay | Admitting: Cardiovascular Disease

## 2023-08-18 ENCOUNTER — Other Ambulatory Visit: Payer: Self-pay | Admitting: Cardiovascular Disease

## 2023-09-26 DIAGNOSIS — M5126 Other intervertebral disc displacement, lumbar region: Secondary | ICD-10-CM | POA: Diagnosis not present

## 2023-09-26 DIAGNOSIS — M5416 Radiculopathy, lumbar region: Secondary | ICD-10-CM | POA: Diagnosis not present

## 2023-09-26 DIAGNOSIS — M5136 Other intervertebral disc degeneration, lumbar region with discogenic back pain only: Secondary | ICD-10-CM | POA: Diagnosis not present

## 2023-09-26 DIAGNOSIS — M47816 Spondylosis without myelopathy or radiculopathy, lumbar region: Secondary | ICD-10-CM | POA: Diagnosis not present

## 2023-09-26 DIAGNOSIS — M48061 Spinal stenosis, lumbar region without neurogenic claudication: Secondary | ICD-10-CM | POA: Diagnosis not present

## 2023-10-18 DIAGNOSIS — Z6831 Body mass index (BMI) 31.0-31.9, adult: Secondary | ICD-10-CM | POA: Diagnosis not present

## 2023-10-18 DIAGNOSIS — M5416 Radiculopathy, lumbar region: Secondary | ICD-10-CM | POA: Diagnosis not present

## 2023-10-31 DIAGNOSIS — M5416 Radiculopathy, lumbar region: Secondary | ICD-10-CM | POA: Diagnosis not present

## 2023-10-31 DIAGNOSIS — M5116 Intervertebral disc disorders with radiculopathy, lumbar region: Secondary | ICD-10-CM | POA: Diagnosis not present

## 2023-11-09 DIAGNOSIS — Z79899 Other long term (current) drug therapy: Secondary | ICD-10-CM | POA: Diagnosis not present

## 2023-11-09 DIAGNOSIS — M25511 Pain in right shoulder: Secondary | ICD-10-CM | POA: Diagnosis not present

## 2023-11-09 DIAGNOSIS — M51362 Other intervertebral disc degeneration, lumbar region with discogenic back pain and lower extremity pain: Secondary | ICD-10-CM | POA: Diagnosis not present

## 2023-12-04 DIAGNOSIS — I10 Essential (primary) hypertension: Secondary | ICD-10-CM | POA: Diagnosis not present

## 2023-12-04 DIAGNOSIS — N289 Disorder of kidney and ureter, unspecified: Secondary | ICD-10-CM | POA: Diagnosis not present

## 2023-12-06 DIAGNOSIS — M25511 Pain in right shoulder: Secondary | ICD-10-CM | POA: Diagnosis not present

## 2023-12-07 DIAGNOSIS — Z0000 Encounter for general adult medical examination without abnormal findings: Secondary | ICD-10-CM | POA: Diagnosis not present

## 2023-12-07 DIAGNOSIS — Z6831 Body mass index (BMI) 31.0-31.9, adult: Secondary | ICD-10-CM | POA: Diagnosis not present

## 2023-12-07 DIAGNOSIS — K5903 Drug induced constipation: Secondary | ICD-10-CM | POA: Diagnosis not present

## 2023-12-07 DIAGNOSIS — E669 Obesity, unspecified: Secondary | ICD-10-CM | POA: Diagnosis not present

## 2023-12-07 DIAGNOSIS — G4730 Sleep apnea, unspecified: Secondary | ICD-10-CM | POA: Diagnosis not present

## 2023-12-07 DIAGNOSIS — R972 Elevated prostate specific antigen [PSA]: Secondary | ICD-10-CM | POA: Diagnosis not present

## 2023-12-07 DIAGNOSIS — Z23 Encounter for immunization: Secondary | ICD-10-CM | POA: Diagnosis not present

## 2023-12-07 DIAGNOSIS — M47816 Spondylosis without myelopathy or radiculopathy, lumbar region: Secondary | ICD-10-CM | POA: Diagnosis not present

## 2023-12-07 DIAGNOSIS — I10 Essential (primary) hypertension: Secondary | ICD-10-CM | POA: Diagnosis not present

## 2023-12-21 DIAGNOSIS — G4733 Obstructive sleep apnea (adult) (pediatric): Secondary | ICD-10-CM | POA: Diagnosis not present

## 2024-01-02 DIAGNOSIS — M25511 Pain in right shoulder: Secondary | ICD-10-CM | POA: Diagnosis not present

## 2024-01-04 ENCOUNTER — Other Ambulatory Visit: Payer: Self-pay | Admitting: Cardiovascular Disease

## 2024-01-05 ENCOUNTER — Other Ambulatory Visit: Payer: Self-pay | Admitting: Cardiovascular Disease

## 2024-01-15 DIAGNOSIS — R972 Elevated prostate specific antigen [PSA]: Secondary | ICD-10-CM | POA: Diagnosis not present

## 2024-01-24 ENCOUNTER — Other Ambulatory Visit: Payer: Self-pay | Admitting: Gastroenterology

## 2024-01-24 DIAGNOSIS — K8689 Other specified diseases of pancreas: Secondary | ICD-10-CM | POA: Diagnosis not present

## 2024-01-25 DIAGNOSIS — R972 Elevated prostate specific antigen [PSA]: Secondary | ICD-10-CM | POA: Diagnosis not present

## 2024-01-25 DIAGNOSIS — N281 Cyst of kidney, acquired: Secondary | ICD-10-CM | POA: Diagnosis not present

## 2024-01-25 DIAGNOSIS — Z87442 Personal history of urinary calculi: Secondary | ICD-10-CM | POA: Diagnosis not present

## 2024-01-25 DIAGNOSIS — R311 Benign essential microscopic hematuria: Secondary | ICD-10-CM | POA: Diagnosis not present

## 2024-01-31 ENCOUNTER — Other Ambulatory Visit: Payer: Self-pay | Admitting: Cardiovascular Disease

## 2024-02-08 DIAGNOSIS — N200 Calculus of kidney: Secondary | ICD-10-CM | POA: Diagnosis not present

## 2024-02-13 DIAGNOSIS — Z23 Encounter for immunization: Secondary | ICD-10-CM | POA: Diagnosis not present

## 2024-02-15 IMAGING — DX DG ABDOMEN 1V
1 series · 1 of 1 positions shown · non-contrast
Comparison: Most recent radiograph 06/18/2021.  CT 05/05/2018

CLINICAL DATA: Lithotripsy today, right-sided stone.

EXAM:
ABDOMEN - 1 VIEW

[abdomen kub]
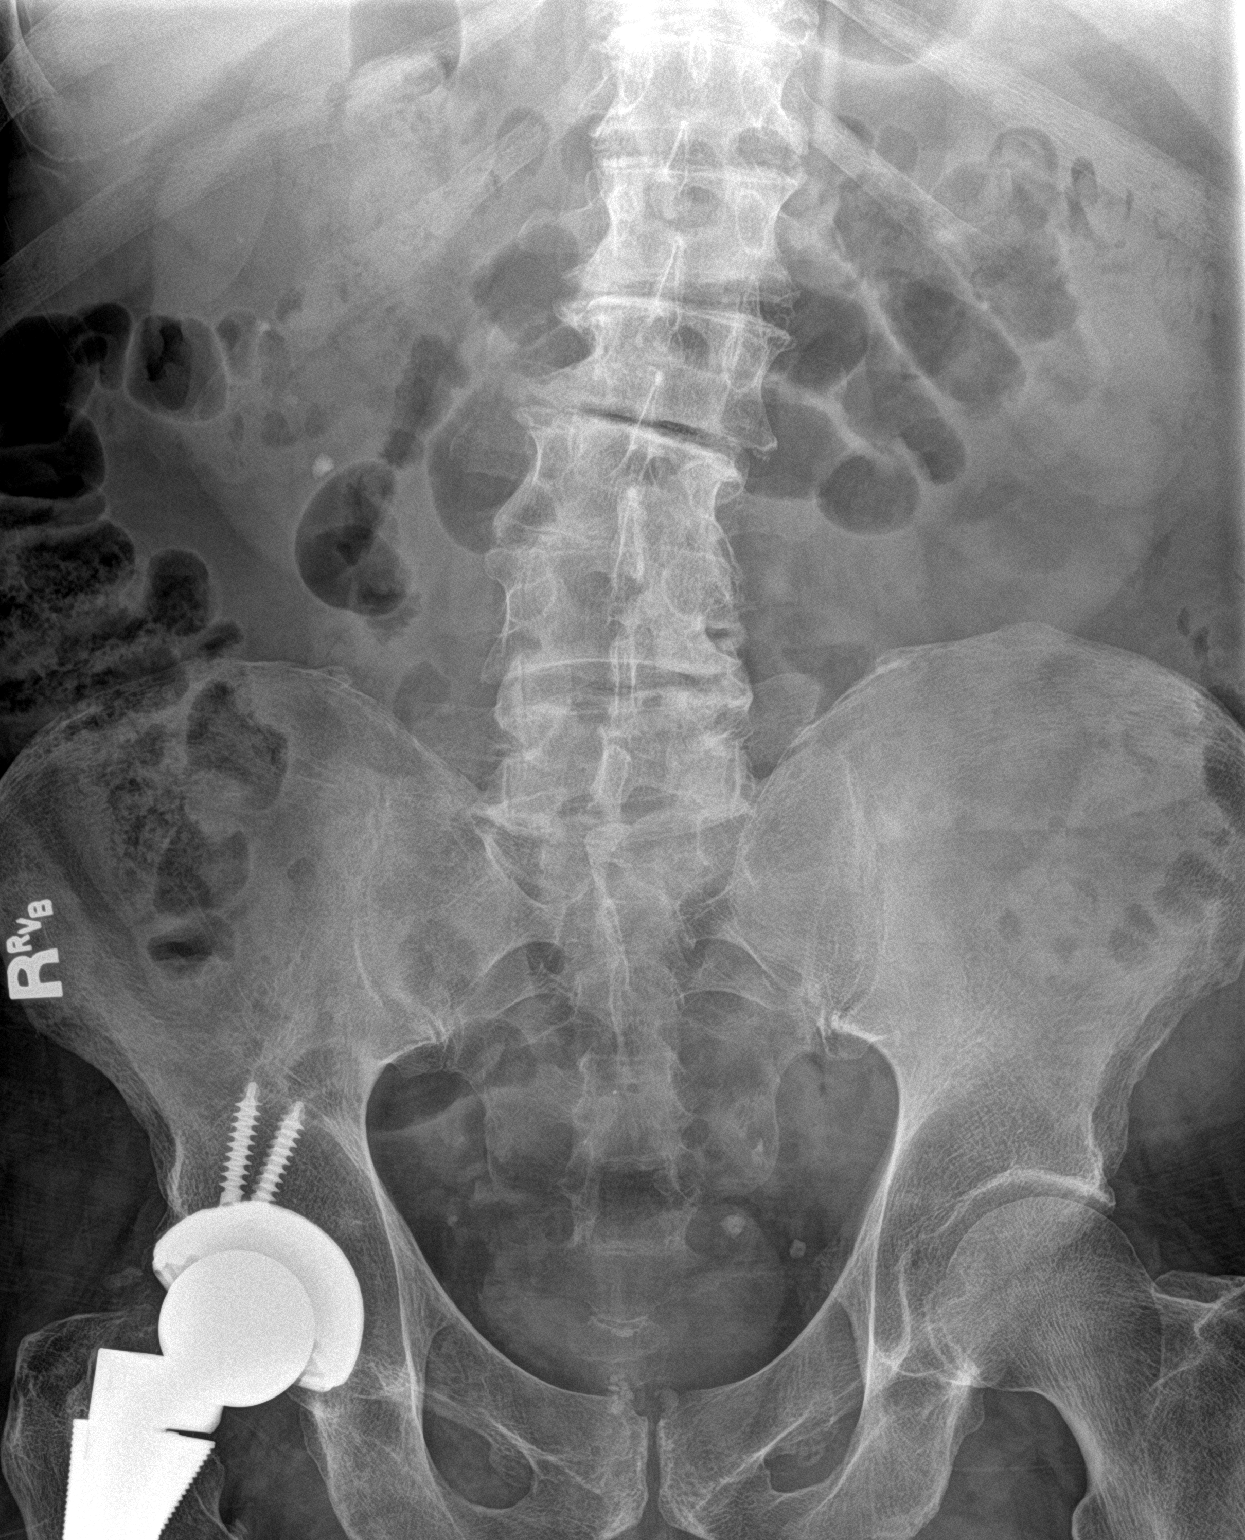

[1 of 1 positions shown; findings below may reference images not displayed]

FINDINGS: At least 4 right renal calculi are seen, largest stone measuring 6
mm projecting over the lower pole. Faint left renal stone.
Additional left renal calculi on prior CT are not well seen on the
current exam. No stones over the course of the ureters. Pelvic
calcifications are unchanged in typical of phleboliths. Normal bowel
gas pattern. Right hip arthroplasty.
IMPRESSION: Bilateral nephrolithiasis, right greater than left.

## 2024-02-20 DIAGNOSIS — D485 Neoplasm of uncertain behavior of skin: Secondary | ICD-10-CM | POA: Diagnosis not present

## 2024-02-20 DIAGNOSIS — D225 Melanocytic nevi of trunk: Secondary | ICD-10-CM | POA: Diagnosis not present

## 2024-02-20 DIAGNOSIS — L821 Other seborrheic keratosis: Secondary | ICD-10-CM | POA: Diagnosis not present

## 2024-02-20 DIAGNOSIS — Z1283 Encounter for screening for malignant neoplasm of skin: Secondary | ICD-10-CM | POA: Diagnosis not present

## 2024-02-20 DIAGNOSIS — L570 Actinic keratosis: Secondary | ICD-10-CM | POA: Diagnosis not present

## 2024-02-20 DIAGNOSIS — X32XXXA Exposure to sunlight, initial encounter: Secondary | ICD-10-CM | POA: Diagnosis not present

## 2024-02-20 DIAGNOSIS — D2272 Melanocytic nevi of left lower limb, including hip: Secondary | ICD-10-CM | POA: Diagnosis not present

## 2024-02-27 DIAGNOSIS — Z23 Encounter for immunization: Secondary | ICD-10-CM | POA: Diagnosis not present

## 2024-03-13 ENCOUNTER — Encounter (HOSPITAL_COMMUNITY): Payer: Self-pay | Admitting: Gastroenterology

## 2024-03-13 DIAGNOSIS — M25511 Pain in right shoulder: Secondary | ICD-10-CM | POA: Diagnosis not present

## 2024-03-13 DIAGNOSIS — M48061 Spinal stenosis, lumbar region without neurogenic claudication: Secondary | ICD-10-CM | POA: Diagnosis not present

## 2024-03-13 DIAGNOSIS — M51362 Other intervertebral disc degeneration, lumbar region with discogenic back pain and lower extremity pain: Secondary | ICD-10-CM | POA: Diagnosis not present

## 2024-03-13 DIAGNOSIS — Z5181 Encounter for therapeutic drug level monitoring: Secondary | ICD-10-CM | POA: Diagnosis not present

## 2024-03-13 NOTE — Progress Notes (Signed)
 Attempted to obtain medical history for pre op call via telephone, unable to reach at this time. HIPAA compliant voicemail message left requesting return call to pre surgical testing department.

## 2024-03-19 NOTE — Anesthesia Preprocedure Evaluation (Signed)
 Anesthesia Evaluation  Patient identified by MRN, date of birth, ID band Patient awake    Reviewed: Allergy & Precautions, NPO status , Patient's Chart, lab work & pertinent test results  History of Anesthesia Complications Negative for: history of anesthetic complications  Airway Mallampati: II  TM Distance: >3 FB Neck ROM: Full    Dental no notable dental hx. (+) Implants, Dental Advisory Given, Lower Dentures   Pulmonary sleep apnea and Continuous Positive Airway Pressure Ventilation , former smoker   Pulmonary exam normal breath sounds clear to auscultation       Cardiovascular hypertension, Pt. on medications (-) angina + Past MI  Normal cardiovascular exam(-) dysrhythmias  Rhythm:Regular Rate:Normal     Neuro/Psych    GI/Hepatic   Endo/Other  neg diabetes    Renal/GU Renal diseasenephrolitiasis     Musculoskeletal  (+) Arthritis ,    Abdominal   Peds  Hematology   Anesthesia Other Findings   Reproductive/Obstetrics                              Anesthesia Physical Anesthesia Plan  ASA: 3  Anesthesia Plan: MAC   Post-op Pain Management: Minimal or no pain anticipated   Induction: Intravenous  PONV Risk Score and Plan: Treatment may vary due to age or medical condition and Propofol  infusion  Airway Management Planned: Natural Airway and Nasal Cannula  Additional Equipment: None  Intra-op Plan:   Post-operative Plan:   Informed Consent: I have reviewed the patients History and Physical, chart, labs and discussed the procedure including the risks, benefits and alternatives for the proposed anesthesia with the patient or authorized representative who has indicated his/her understanding and acceptance.     Dental advisory given  Plan Discussed with: CRNA and Anesthesiologist  Anesthesia Plan Comments: (For dialated pancreatic duct)         Anesthesia Quick  Evaluation

## 2024-03-20 ENCOUNTER — Encounter (HOSPITAL_COMMUNITY)
Admission: RE | Disposition: A | Discharge status: Home / Self Care | Payer: Self-pay | Source: Home / Self Care | Attending: Gastroenterology

## 2024-03-20 ENCOUNTER — Ambulatory Visit (HOSPITAL_COMMUNITY): Payer: Self-pay | Admitting: Anesthesiology

## 2024-03-20 ENCOUNTER — Other Ambulatory Visit: Payer: Self-pay

## 2024-03-20 ENCOUNTER — Ambulatory Visit (HOSPITAL_BASED_OUTPATIENT_CLINIC_OR_DEPARTMENT_OTHER): Payer: Self-pay | Admitting: Anesthesiology

## 2024-03-20 ENCOUNTER — Ambulatory Visit (HOSPITAL_COMMUNITY)
Admission: RE | Admit: 2024-03-20 | Discharge: 2024-03-20 | Disposition: A | Discharge status: Home / Self Care | Attending: Gastroenterology | Admitting: Gastroenterology

## 2024-03-20 ENCOUNTER — Encounter (HOSPITAL_COMMUNITY): Payer: Self-pay | Admitting: Gastroenterology

## 2024-03-20 DIAGNOSIS — I10 Essential (primary) hypertension: Secondary | ICD-10-CM | POA: Insufficient documentation

## 2024-03-20 DIAGNOSIS — K8689 Other specified diseases of pancreas: Secondary | ICD-10-CM | POA: Insufficient documentation

## 2024-03-20 DIAGNOSIS — Z87891 Personal history of nicotine dependence: Secondary | ICD-10-CM | POA: Diagnosis not present

## 2024-03-20 DIAGNOSIS — Z6831 Body mass index (BMI) 31.0-31.9, adult: Secondary | ICD-10-CM | POA: Diagnosis not present

## 2024-03-20 DIAGNOSIS — G4733 Obstructive sleep apnea (adult) (pediatric): Secondary | ICD-10-CM | POA: Diagnosis not present

## 2024-03-20 DIAGNOSIS — Z87442 Personal history of urinary calculi: Secondary | ICD-10-CM | POA: Diagnosis not present

## 2024-03-20 DIAGNOSIS — M1990 Unspecified osteoarthritis, unspecified site: Secondary | ICD-10-CM | POA: Insufficient documentation

## 2024-03-20 DIAGNOSIS — Z79899 Other long term (current) drug therapy: Secondary | ICD-10-CM | POA: Insufficient documentation

## 2024-03-20 DIAGNOSIS — G4730 Sleep apnea, unspecified: Secondary | ICD-10-CM | POA: Diagnosis not present

## 2024-03-20 DIAGNOSIS — I252 Old myocardial infarction: Secondary | ICD-10-CM | POA: Diagnosis not present

## 2024-03-20 DIAGNOSIS — E669 Obesity, unspecified: Secondary | ICD-10-CM | POA: Insufficient documentation

## 2024-03-20 HISTORY — PX: ESOPHAGOGASTRODUODENOSCOPY: SHX5428

## 2024-03-20 HISTORY — PX: EUS: SHX5427

## 2024-03-20 SURGERY — ULTRASOUND, UPPER GI TRACT, ENDOSCOPIC
Anesthesia: Monitor Anesthesia Care

## 2024-03-20 MED ORDER — PROPOFOL 500 MG/50ML IV EMUL
INTRAVENOUS | Status: DC | PRN
Start: 2024-03-20 — End: 2024-03-20
  Administered 2024-03-20: 150 ug/kg/min via INTRAVENOUS

## 2024-03-20 MED ORDER — PROPOFOL 10 MG/ML IV BOLUS
INTRAVENOUS | Status: DC | PRN
  Administered 2024-03-20 (×2): 50 mg via INTRAVENOUS

## 2024-03-20 MED ORDER — SODIUM CHLORIDE 0.9 % IV SOLN: INTRAVENOUS | Status: DC

## 2024-03-20 MED ORDER — LIDOCAINE 2% (20 MG/ML) 5 ML SYRINGE
INTRAMUSCULAR | Status: DC | PRN
  Administered 2024-03-20: 80 mg via INTRAVENOUS

## 2024-03-20 MED ORDER — GLYCOPYRROLATE 0.2 MG/ML IJ SOLN
INTRAMUSCULAR | Status: DC | PRN
Start: 2024-03-20 — End: 2024-03-20
  Administered 2024-03-20: .2 mg via INTRAVENOUS

## 2024-03-20 MED ORDER — LACTATED RINGERS IV SOLN
INTRAVENOUS | Status: DC | PRN
Start: 2024-03-20 — End: 2024-03-20

## 2024-03-20 MED ORDER — LACTATED RINGERS IV SOLN
INTRAVENOUS | Status: AC | PRN
  Administered 2024-03-20: 1000 mL via INTRAVENOUS

## 2024-03-20 MED ORDER — ONDANSETRON HCL 4 MG/2ML IJ SOLN
INTRAMUSCULAR | Status: DC | PRN
  Administered 2024-03-20: 4 mg via INTRAVENOUS

## 2024-03-20 NOTE — Discharge Instructions (Signed)

## 2024-03-20 NOTE — Transfer of Care (Signed)
 Immediate Anesthesia Transfer of Care Note  Patient: Luis Mccann  Procedure(s) Performed: ULTRASOUND, UPPER GI TRACT, ENDOSCOPIC  Patient Location: PACU  Anesthesia Type:MAC  Level of Consciousness: awake and alert   Airway & Oxygen Therapy: Patient Spontanous Breathing and Patient connected to nasal cannula oxygen  Post-op Assessment: Report given to RN and Post -op Vital signs reviewed and stable  Post vital signs: Reviewed and stable  Last Vitals:  Vitals Value Taken Time  BP 128/72 03/20/24 09:00  Temp 37.1 C 03/20/24 08:57  Pulse 52 03/20/24 09:06  Resp 10 03/20/24 09:06  SpO2 94 % 03/20/24 09:06  Vitals shown include unfiled device data.  Last Pain:  Vitals:   03/20/24 0857  TempSrc: Temporal  PainSc:       Patients Stated Pain Goal: 0 (03/20/24 0735)  Complications: No notable events documented.

## 2024-03-20 NOTE — H&P (Signed)
 [facility] H/P Note  Chief Complaint: dilated pancreatic duct  HPI: Luis Mccann is an 68 y.o. male.  Dilated pancreatic duct.  No abdominal pain or weight loss.  Past Medical History:  Diagnosis Date   Arthritis    osteoarthritis   Back pain    Dysrhythmia 2000   single episode of afib with RVR with normal echo and rare atrial/ventricular ectopy on f/u [facility])   History of kidney stones    Hypertension    Joint pain    Obesity (BMI 30.0-34.9) 04/04/2013   Osteoarthritis    Pneumonia    hx of age 27   Severe obstructive sleep apnea    do not wear mask/   Sleep apnea    SOBOE (shortness of breath on exertion)     Past Surgical History:  Procedure Laterality Date   ACHILLES TENDON REPAIR Left 1987 and 1990   COLONOSCOPY     DOPPLER ECHOCARDIOGRAPHY  06/12/1998   normal doppler examination, normal overall LV systolic function   EXTRACORPOREAL SHOCK WAVE LITHOTRIPSY Left 01/27/2020   Procedure: EXTRACORPOREAL SHOCK WAVE LITHOTRIPSY (ESWL);  Surgeon: Renda Glance, MD;  Location: [facility];  Service: Urology;  Laterality: Left;   EXTRACORPOREAL SHOCK WAVE LITHOTRIPSY Left 05/31/2021   Procedure: EXTRACORPOREAL SHOCK WAVE LITHOTRIPSY (ESWL);  Surgeon: Renda Glance, MD;  [facility];  Service: Urology;  Laterality: Left;   EXTRACORPOREAL SHOCK WAVE LITHOTRIPSY Right 08/26/2021   Procedure: EXTRACORPOREAL SHOCK WAVE LITHOTRIPSY (ESWL);  Surgeon: Elisabeth Valli BIRCH, MD;  Location: [facility];  Service: Urology;  Laterality: Right;   HIP SURGERY Right    arthoscopic x1, one other surgery    INCISION AND DRAINAGE HIP Right 09/24/2013   Procedure: RIGHT HIP BEARING SURFACE REVISION;  Surgeon: Dempsey Melodi GAILS, MD;  Location: [facility];  Service: Orthopedics;  Laterality: Right;   JOINT REPLACEMENT  1995   right hip   KIDNEY STONE SURGERY     LITHOTRIPSY     x4   NASAL SEPTOPLASTY W/ TURBINOPLASTY Bilateral 01/02/2013    Procedure: NASAL SEPTOPLASTY WITH TURBINATE REDUCTION;  Surgeon: Marlyce Finer, MD;  Location: [facility];  Service: ENT;  Laterality: Bilateral;   NASAL SEPTUM SURGERY  1980's   RENAL ARTERY DUPLEX  09/22/2004   abdominal aorta-normal taper with no suggestion of aneurysmal dilatation/diameter reduction. normal patency of right and left kidney arteries. right and left kidneys equal in size, symmetrical in shape with normal cortex and medulla with normal resistance indices and normal echogencity with no evidence of hydronephrosis.   SHOULDER SURGERY Left 1990's   for burr   SHOULDER SURGERY Right 2010, 2012   abuttment and rotator cuff repair   SLEEP STUDY  10/25/2011   AHI during total sleep time - 88.39/hr, AHI during REM 75.00/hr    Medications Prior to Admission  Medication Sig Dispense Refill   amLODipine  (NORVASC ) 2.5 MG tablet TAKE 1 TABLET BY MOUTH EVERY DAY 30 tablet 0   Ascorbic Acid  (VITAMIN C ) 1000 MG tablet Take 500 mg by mouth daily.     Ferrous Sulfate  (IRON) 28 MG TABS Take 1 tablet by mouth. Monday Wednesday Friday     ibuprofen  (ADVIL ) 800 MG tablet 1 tablet with food or milk as needed     Multiple Vitamin (MULTIVITAMIN ADULT PO) Take by mouth.     olmesartan -hydrochlorothiazide  (BENICAR  HCT) 40-25 MG tablet TAKE 1 TABLET BY MOUTH EVERY DAY 90 tablet 3   oxyCODONE -acetaminophen  (PERCOCET) 10-325 MG  tablet Take 1 tablet by mouth as needed for pain.     spironolactone  (ALDACTONE ) 25 MG tablet TAKE 1 TABLET EVERY DAY. KEEP UPCOMING APPOINTMENT FOR FUTURE REFILLS 90 tablet 0    Allergies: No Known Allergies  Family History  Problem Relation Age of Onset   Cancer Sister 49       Multiple myeloma   Hypertension Mother    Stroke Mother     Social History:  reports that he has quit smoking. His smoking use included cigarettes. He has never used smokeless tobacco. He reports current alcohol use. He reports that he does not use drugs.   ROS: As per HPI, all others  negative   Blood pressure 130/85, temperature (!) 97 F (36.1 C), temperature source Temporal, resp. rate 18, height 5' 8.5 (1.74 m), weight 93.9 kg, SpO2 96%. General appearance: NAD HEENT:  [place]/AT, anicteric LUNGS:  No respiratory distress ABD:  Soft, non-tender NEURO:  Non-focal  No results found for this or any previous visit (from the past 48 hours). No results found.  Assessment/Plan   Dilated pancreatic duct. Upper endoscopic ultrasound with possible fine needle aspiration. Risks (bleeding, infection, bowel perforation that could require surgery, sedation-related changes in cardiopulmonary systems), benefits (identification and possible treatment of source of symptoms, exclusion of certain causes of symptoms), and alternatives (watchful waiting, radiographic imaging studies, empiric medical treatment) of upper endoscopy with ultrasound and possible fine needle aspiratoin (EUS +/- FNA) were explained to patient/family in detail and patient wishes to proceed.   Luis Mccann 03/20/2024, 8:20 AM

## 2024-03-20 NOTE — Anesthesia Postprocedure Evaluation (Signed)
 Anesthesia Post Note  Patient: Luis Mccann  Procedure(s) Performed: ULTRASOUND, UPPER GI TRACT, ENDOSCOPIC     Patient location during evaluation: Endoscopy Anesthesia Type: MAC Level of consciousness: awake and alert Pain management: pain level controlled Vital Signs Assessment: post-procedure vital signs reviewed and stable Respiratory status: spontaneous breathing, nonlabored ventilation, respiratory function stable and patient connected to nasal cannula oxygen Cardiovascular status: blood pressure returned to baseline and stable Postop Assessment: no apparent nausea or vomiting Anesthetic complications: no   No notable events documented.  Last Vitals:  Vitals:   03/20/24 0910 03/20/24 0920  BP: 125/67 121/75  Pulse: (!) 55 (!) 52  Resp: 18 17  Temp:    SpO2: 94% 95%    Last Pain:  Vitals:   03/20/24 0905  TempSrc:   PainSc: 0-No pain                 Garnette DELENA Gab

## 2024-03-20 NOTE — Op Note (Signed)
 [facility] Patient Name: Luis Mccann Procedure Date: 03/20/2024 MRN: [id number] Attending MD: Elsie Cree , MD, [id number] Date of Birth: 1955/06/12 CSN: [id number] Age: 68 Admit Type: Outpatient Procedure:                Upper EUS Indications:              Dilated pancreatic duct on MRI Providers:                Elsie Cree, MD, Robie Breed, RN,                            Haskel Chris, Technician Referring MD:              Medicines:                Monitored Anesthesia Care Complications:            No immediate complications. Estimated Blood Loss:     Estimated blood loss: none. Procedure:                Pre-Anesthesia Assessment:                           - Prior to the procedure, a History and Physical                            was performed, and patient medications and                            allergies were reviewed. The patient's tolerance of                            previous anesthesia was also reviewed. The risks                            and benefits of the procedure and the sedation                            options and risks were discussed with the patient.                            All questions were answered, and informed consent                            was obtained. Prior Anticoagulants: The patient has                            taken no anticoagulant or antiplatelet agents. ASA                            Grade Assessment: II - A patient with mild systemic                            disease. After reviewing the risks and benefits,  the patient was deemed in satisfactory condition to                            undergo the procedure.                           After obtaining informed consent, the endoscope was                            passed under direct vision. Throughout the                            procedure, the patient's blood pressure, pulse, and                            oxygen  saturations were monitored continuously. The                            [id number] ([id number] Olympus endosonoscope was                            introduced through the mouth, and advanced to the                            second part of duodenum. The upper EUS was                            accomplished without difficulty. The patient                            tolerated the procedure well. Scope In: Scope Out: Findings:      ENDOSCOPIC FINDING: :      The ampulla was normal.      ENDOSONOGRAPHIC FINDING: :      There was no sign of significant endosonographic abnormality in the       pancreatic head, genu of the pancreas, pancreatic body and pancreatic       tail. The pancreatic duct measured up to 5-6 mm in diameter in       pancreatic head but normal diameter throughout the genu, body and tail.       No masses. No pancreatic duct calcifications. No chronic pancreatitis.      There was no sign of significant endosonographic abnormality in the       common bile duct.      No lymphadenopathy seen. Impression:               - Normal ampulla.                           - There was no sign of significant pathology in the                            pancreatic head, genu of the pancreas, pancreatic                            body and pancreatic tail.                           -  There was no sign of significant pathology in the                            common bile duct.                           - Prominent pancreatic duct, localized to                            pancreatic head, maximally 5-6 mm. This is                            unchanged on imaging since January 2022 and patient                            is asymptomatic. No ampullary or pancreatic mass                            seen. Chronic use of narcotics can cause pancreatic                            ductal dilatation. Moderate Sedation:      None Recommendation:           - Discharge patient to home (via wheelchair).                            - Resume previous diet today.                           - Continue present medications.                           - No further investigation into dilated pancreatic                            duct is needed in absence of new/interval symptoms.                           - Return to GI clinic PRN. Procedure Code(s):        --- Professional ---                           [phone], Esophagogastroduodenoscopy, flexible,                            transoral; with endoscopic ultrasound examination,                            including the esophagus, stomach, and either the                            duodenum or a surgically altered stomach where the                            jejunum is examined distal to  the anastomosis Diagnosis Code(s):        --- Professional ---                           K86.89, Other specified diseases of pancreas CPT copyright 2022 American Medical Association. All rights reserved. The codes documented in this report are preliminary and upon coder review may  be revised to meet current compliance requirements. Elsie Cree, MD 03/20/2024 9:09:37 AM This report has been signed electronically. Number of Addenda: 0

## 2024-03-21 ENCOUNTER — Encounter (HOSPITAL_COMMUNITY): Payer: Self-pay | Admitting: Gastroenterology

## 2024-04-02 DIAGNOSIS — M25511 Pain in right shoulder: Secondary | ICD-10-CM | POA: Diagnosis not present

## 2024-04-10 DIAGNOSIS — M25511 Pain in right shoulder: Secondary | ICD-10-CM | POA: Diagnosis not present

## 2024-04-12 ENCOUNTER — Telehealth: Payer: Self-pay | Admitting: Cardiovascular Disease

## 2024-04-12 MED ORDER — AMLODIPINE BESYLATE 2.5 MG PO TABS: 2.5000 mg | ORAL_TABLET | Freq: Every day | ORAL | 0 refills | Status: AC

## 2024-04-12 NOTE — Telephone Encounter (Signed)
°*  STAT* If patient is at the pharmacy, call can be transferred to refill team.   1. Which medications need to be refilled? (please list name of each medication and dose if known)  amLODipine  (NORVASC ) 2.5 MG tablet     2. Would you like to learn more about the convenience, safety, & potential cost savings by using the [facility]? no   3. Are you open to using the [facility] (Type [facility]. no   4. Which pharmacy/location (including street and city if local pharmacy) is medication to be sent to? [id number] - [place], [place] - [address]   5. Do they need a 30 day or 90 day supply? 90 day     Got a refill for 30 days but gave it back because he wants 90 day refill until his appt. For 2/11

## 2024-04-12 NOTE — Telephone Encounter (Signed)
°*  STAT* If patient is at the pharmacy, call can be transferred to refill team.   1. Which medications need to be refilled? (please list name of each medication and dose if known)   amLODipine  (NORVASC ) 2.5 MG tablet    2. Which pharmacy/location (including street and city if local pharmacy) is medication to be sent to?  [id number] - [place], [place] - [address]      3. Do they need a 30 day or 90 day supply? 90 day    Pt is out of medication and has an office visit scheduled.

## 2024-04-12 NOTE — Telephone Encounter (Signed)
 This is a duplicate encounter.

## 2024-04-12 NOTE — Telephone Encounter (Signed)
 Spoke with pt, per pt, he didn't get a recall letter is why he hasn't been seen.  I did agree to send in a 90 day refill for pt until his appointment.

## 2024-04-15 ENCOUNTER — Encounter: Payer: Self-pay | Admitting: Cardiovascular Disease

## 2024-04-15 ENCOUNTER — Ambulatory Visit: Attending: Cardiovascular Disease | Admitting: Cardiovascular Disease

## 2024-04-15 VITALS — BP 112/68 | Ht 68.0 in | Wt 208.1 lb

## 2024-04-15 DIAGNOSIS — I10 Essential (primary) hypertension: Secondary | ICD-10-CM

## 2024-04-15 DIAGNOSIS — E669 Obesity, unspecified: Secondary | ICD-10-CM

## 2024-04-15 DIAGNOSIS — I77810 Thoracic aortic ectasia: Secondary | ICD-10-CM

## 2024-04-15 DIAGNOSIS — G4733 Obstructive sleep apnea (adult) (pediatric): Secondary | ICD-10-CM

## 2024-04-15 DIAGNOSIS — I480 Paroxysmal atrial fibrillation: Secondary | ICD-10-CM

## 2024-04-15 NOTE — Progress Notes (Unsigned)
 Cardiology Office Note    Date:  04/15/2024   ID:  Luis Mccann, DOB 1955-07-12, MRN [id number]  PCP:  Cleotilde Planas, MD  Cardiologist:   Jerel Balding, MD   No chief complaint on file.   History of Present Illness:  Luis Mccann is a 68 y.o. male with remote history of a single episode of atrial fibrillation, OSA on CPAP, essential hypertension, borderline obesity, but without known structural heart disease returns for follow-up.  Unfortunately he has gained back weight.  BMI is back up to 31.  He has not been as active due to problems with hip pain.  He has recently been having episodes of weakness and fatigue.  He was trying to mow his lawn and not to stop after each 1-2 passes because he felt exhausted.  The next day he had to drink 5 bottles of water because of being so thirsty.  He did not have shortness of breath or chest tightness but just felt extremely tired.  Symptoms have been worse during the hot/humid months.  He reports compliance with CPAP, but he has good nights and bad nights often wakes up at 3:00 in the morning due to hip pain.  The next day after a night of poor sleep, he was feeling worse fatigue.  He thinks he may have a new left-sided kidney stone.  He required lithotripsy last year.   Metabolic parameters are pretty good, with a hemoglobin A1c of 5.6%, HDL 68, LDL 59.  He has a lot of joint problems.  He complains of pain in his right shoulder where he has had a previous subacromial resection rotator cuff repair, his right hip which has been replaced twice, lumbar spine where he has a known problem with L4, both knees.  Haemophilus L3-L4 discitis in October-November 2019.  No evidence of endocarditis by echocardiogram and blood cultures.   Past Medical History:  Diagnosis Date   Arthritis    osteoarthritis   Back pain    Dysrhythmia 2000   single episode of afib with RVR with normal echo and rare atrial/ventricular ectopy on f/u [facility])   History of  kidney stones    Hypertension    Joint pain    Obesity (BMI 30.0-34.9) 04/04/2013   Osteoarthritis    Pneumonia    hx of age 28   Severe obstructive sleep apnea    do not wear mask/   Sleep apnea    SOBOE (shortness of breath on exertion)     Past Surgical History:  Procedure Laterality Date   ACHILLES TENDON REPAIR Left 1987 and 1990   COLONOSCOPY     DOPPLER ECHOCARDIOGRAPHY  06/12/1998   normal doppler examination, normal overall LV systolic function   ESOPHAGOGASTRODUODENOSCOPY N/A 03/20/2024   Procedure: EGD (ESOPHAGOGASTRODUODENOSCOPY);  Surgeon: Burnette Fallow, MD;  Location: [facility];  Service: Gastroenterology;  Laterality: N/A;   EUS N/A 03/20/2024   Procedure: ULTRASOUND, UPPER GI TRACT, ENDOSCOPIC;  Surgeon: Burnette Fallow, MD;  Location: [facility];  Service: Gastroenterology;  Laterality: N/A;   EXTRACORPOREAL SHOCK WAVE LITHOTRIPSY Left 01/27/2020   Procedure: EXTRACORPOREAL SHOCK WAVE LITHOTRIPSY (ESWL);  Surgeon: Renda Glance, MD;  Location: [facility];  Service: Urology;  Laterality: Left;   EXTRACORPOREAL SHOCK WAVE LITHOTRIPSY Left 05/31/2021   Procedure: EXTRACORPOREAL SHOCK WAVE LITHOTRIPSY (ESWL);  Surgeon: Renda Glance, MD;  Location[facility];  Service: Urology;  Laterality: Left;   EXTRACORPOREAL SHOCK WAVE LITHOTRIPSY Right 08/26/2021   Procedure: EXTRACORPOREAL  SHOCK WAVE LITHOTRIPSY (ESWL);  Surgeon: Elisabeth Valli BIRCH, MD;  Location: [facility];  Service: Urology;  Laterality: Right;   HIP SURGERY Right    arthoscopic x1, one other surgery    INCISION AND DRAINAGE HIP Right 09/24/2013   Procedure: RIGHT HIP BEARING SURFACE REVISION;  Surgeon: Dempsey Melodi GAILS, MD;  Location: [facility];  Service: Orthopedics;  Laterality: Right;   JOINT REPLACEMENT  1995   right hip   KIDNEY STONE SURGERY     LITHOTRIPSY     x4   NASAL SEPTOPLASTY W/ TURBINOPLASTY Bilateral 01/02/2013   Procedure: NASAL SEPTOPLASTY  WITH TURBINATE REDUCTION;  Surgeon: Marlyce Finer, MD;  Location: [facility];  Service: ENT;  Laterality: Bilateral;   NASAL SEPTUM SURGERY  1980's   RENAL ARTERY DUPLEX  09/22/2004   abdominal aorta-normal taper with no suggestion of aneurysmal dilatation/diameter reduction. normal patency of right and left kidney arteries. right and left kidneys equal in size, symmetrical in shape with normal cortex and medulla with normal resistance indices and normal echogencity with no evidence of hydronephrosis.   SHOULDER SURGERY Left 1990's   for burr   SHOULDER SURGERY Right 2010, 2012   abuttment and rotator cuff repair   SLEEP STUDY  10/25/2011   AHI during total sleep time - 88.39/hr, AHI during REM 75.00/hr    Current Medications: Outpatient Medications Prior to Visit  Medication Sig Dispense Refill   amLODipine  (NORVASC ) 2.5 MG tablet Take 1 tablet (2.5 mg total) by mouth daily. 90 tablet 0   Ascorbic Acid  (VITAMIN C ) 1000 MG tablet Take 500 mg by mouth daily. (Patient taking differently: Take 1,000 mg by mouth daily.)     Ferrous Sulfate  (IRON) 28 MG TABS Take 1 tablet by mouth. Monday Wednesday Friday     ibuprofen  (ADVIL ) 800 MG tablet 1 tablet with food or milk as needed     Multiple Vitamin (MULTI VITAMIN) TABS 1 tablet Orally Once a day     Multiple Vitamin (MULTIVITAMIN ADULT PO) Take by mouth.     olmesartan -hydrochlorothiazide  (BENICAR  HCT) 40-25 MG tablet TAKE 1 TABLET BY MOUTH EVERY DAY 90 tablet 3   oxyCODONE -acetaminophen  (PERCOCET) 10-325 MG tablet Take 1 tablet by mouth as needed for pain.     polyethylene glycol powder (MIRALAX ) 17 GM/SCOOP powder Take 17 g by mouth daily.     spironolactone  (ALDACTONE ) 25 MG tablet TAKE 1 TABLET EVERY DAY. KEEP UPCOMING APPOINTMENT FOR FUTURE REFILLS 90 tablet 0   No facility-administered medications prior to visit.     Allergies:   Patient has no known allergies.   Social History   Socioeconomic History   Marital status: Married    Spouse  name: Tawfiq Favila   Number of children: Not on file   Years of education: Not on file   Highest education level: Not on file  Occupational History   Occupation: IT TRAINER, [employer]  Tobacco Use   Smoking status: Former    Types: Cigarettes   Smokeless tobacco: Never   Tobacco comments:    quit 30 years ago-social smoker  Vaping Use   Vaping status: Never Used  Substance and Sexual Activity   Alcohol use: Yes    Comment: occasional   Drug use: No   Sexual activity: Not on file  Other Topics Concern   Not on file  Social History Narrative   Not on file   Social Drivers of Health   Tobacco Use: Medium Risk (04/15/2024)   Patient History  Smoking Tobacco Use: Former    Smokeless Tobacco Use: Never    Passive Exposure: Not on Actuary Strain: Not on file  Food Insecurity: Not on file  Transportation Needs: Not on file  Physical Activity: Not on file  Stress: Not on file  Social Connections: Unknown (09/14/2021)   Received from [facility]   Social Network    Social Network: Not on file  Depression (PHQ2-9): Not on file  Alcohol Screen: Not on file  Housing: Not on file  Utilities: Not on file  Health Literacy: Not on file     Family History:  The patient's family history includes Cancer (age of onset: 34) in his sister; Hypertension in his mother; Stroke in his mother.   ROS:   Please see the history of present illness.    ROS all other systems are reviewed and are negative   PHYSICAL EXAM:   VS:  BP 112/68 (BP Location: Left Arm, Patient Position: Sitting, Cuff Size: Large)   Ht 5' 8 (1.727 m)   Wt 208 lb 1.6 oz (94.4 kg)   SpO2 94%   BMI 31.64 kg/m       General: Alert, oriented x3, no distress, mildly obese Head: no evidence of trauma, PERRL, EOMI, no exophtalmos or lid lag, no myxedema, no xanthelasma; normal ears, nose and oropharynx Neck: normal jugular venous pulsations and no hepatojugular reflux; brisk carotid pulses without  delay and no carotid bruits Chest: clear to auscultation, no signs of consolidation by percussion or palpation, normal fremitus, symmetrical and full respiratory excursions Cardiovascular: normal position and quality of the apical impulse, regular rhythm, normal first and second heart sounds, 1-2/6 aortic ejection murmur no diastolic murmurs, rubs or gallops Abdomen: no tenderness or distention, no masses by palpation, no abnormal pulsatility or arterial bruits, normal bowel sounds, no hepatosplenomegaly Extremities: no clubbing, cyanosis or edema; 2+ radial, ulnar and brachial pulses bilaterally; 2+ right femoral, posterior tibial and dorsalis pedis pulses; 2+ left femoral, posterior tibial and dorsalis pedis pulses; no subclavian or femoral bruits Neurological: grossly nonfocal Psych: Normal mood and affect     Wt Readings from Last 3 Encounters:  04/15/24 208 lb 1.6 oz (94.4 kg)  12/14/22 208 lb 9.6 oz (94.6 kg)  06/08/22 209 lb (94.8 kg)      Studies/Labs Reviewed:   EKG: Ordered today shows normal sinus rhythm, left axis deviation/left anterior fascicular block, QRS 74 ms, QTc 420 ms. Recent Labs: No results found for requested labs within last 365 days.  10/01/2022 Cholesterol 143, HDL 68, LDL 59, triglycerides 86 Hemoglobin 14.8, creatinine 1.35, potassium 4.5, ALT 20  ASSESSMENT:    1. Paroxysmal atrial fibrillation (HCC)   2. Essential hypertension   3. OSA (obstructive sleep apnea)   4. Mild obesity      PLAN:  In order of problems listed above:  AFib: He has not had any symptoms of palpitations or any documentation of tachycardia except for the initial episode that occurred almost 20 years ago.  Embolic risk is low-moderate. CHADSVasc 2 (HTN, age).  Not on anticoagulants unless we document recurrence. HTN: BP is quite low today.  This probably explains his exertional fatigue.  Decrease amlodipine  to 2.5 mg daily. OSA: Has a new mask which fits a little better.   Still has occasional problems with leaks.  Wears the CPAP every night and denies daytime hypersomnolence. Mild obesity: Encouraged him to try to lose weight again. Murmur: no major valve issues on echo 2020.  Mildly dilated ascending aorta by echo, may not be an accurate measurement. Consider more accurate measurement by CT. Likely HTN related    Medication Adjustments/Labs and Tests Ordered: Current medicines are reviewed at length with the patient today.  Concerns regarding medicines are outlined above.  Medication changes, Labs and Tests ordered today are listed in the Patient Instructions below. Patient Instructions  Medication Instructions:  *** *If you need a refill on your cardiac medications before your next appointment, please call your pharmacy*  Lab Work: *** If you have labs (blood work) drawn today and your tests are completely normal, you will receive your results only by: MyChart Message (if you have MyChart) OR A paper copy in the mail If you have any lab test that is abnormal or we need to change your treatment, we will call you to review the results.  Testing/Procedures: ***  Follow-Up: At [facility] you and your health needs are our priority.  As part of our continuing mission to provide you with exceptional heart care, our providers are all part of one team.  This team includes your primary Cardiologist (physician) and Advanced Practice Providers or APPs (Physician Assistants and Nurse Practitioners) who all work together to provide you with the care you need, when you need it.  Your next appointment:   {numbers 1-12:[id number]} {Time; day/wk/mo/yr(s):9076}  Provider:   Jerel Balding, MD    We recommend signing up for the patient portal called MyChart.  Sign up information is provided on this After Visit Summary.  MyChart is used to connect with patients for Virtual Visits (Telemedicine).  Patients are able to view lab/test results, encounter notes,  upcoming appointments, etc.  Non-urgent messages can be sent to your provider as well.   To learn more about what you can do with MyChart, go to [email or web address].      Signed, Jerel Balding, MD  04/15/2024 11:52 AM    [facility] [address] [place], [place], [place]  [address] Phone: [phone]; Fax: [phone]

## 2024-04-15 NOTE — Patient Instructions (Signed)

## 2024-04-24 ENCOUNTER — Other Ambulatory Visit: Payer: Self-pay | Admitting: Cardiovascular Disease

## 2024-06-12 ENCOUNTER — Ambulatory Visit: Admitting: Cardiology
